# Patient Record
Sex: Female | Born: 1965
Health system: Southern US, Community
[De-identification: ages and names within clinical notes are randomized; demographics above are authoritative.]

## PROBLEM LIST (undated history)

## (undated) ENCOUNTER — Emergency Department (HOSPITAL_COMMUNITY): Payer: 59

## (undated) DIAGNOSIS — I10 Essential (primary) hypertension: Secondary | ICD-10-CM

## (undated) DIAGNOSIS — E739 Lactose intolerance, unspecified: Secondary | ICD-10-CM

## (undated) DIAGNOSIS — E119 Type 2 diabetes mellitus without complications: Secondary | ICD-10-CM

## (undated) DIAGNOSIS — M2550 Pain in unspecified joint: Secondary | ICD-10-CM

## (undated) DIAGNOSIS — K219 Gastro-esophageal reflux disease without esophagitis: Secondary | ICD-10-CM

## (undated) DIAGNOSIS — R51 Headache: Secondary | ICD-10-CM

## (undated) DIAGNOSIS — M797 Fibromyalgia: Secondary | ICD-10-CM

## (undated) DIAGNOSIS — T4145XA Adverse effect of unspecified anesthetic, initial encounter: Secondary | ICD-10-CM

## (undated) DIAGNOSIS — G9332 Myalgic encephalomyelitis/chronic fatigue syndrome: Secondary | ICD-10-CM

## (undated) DIAGNOSIS — J45909 Unspecified asthma, uncomplicated: Secondary | ICD-10-CM

## (undated) DIAGNOSIS — R519 Headache, unspecified: Secondary | ICD-10-CM

## (undated) DIAGNOSIS — T8859XA Other complications of anesthesia, initial encounter: Secondary | ICD-10-CM

## (undated) DIAGNOSIS — O24419 Gestational diabetes mellitus in pregnancy, unspecified control: Secondary | ICD-10-CM

## (undated) DIAGNOSIS — K760 Fatty (change of) liver, not elsewhere classified: Secondary | ICD-10-CM

## (undated) DIAGNOSIS — F32A Depression, unspecified: Secondary | ICD-10-CM

## (undated) DIAGNOSIS — R5382 Chronic fatigue, unspecified: Secondary | ICD-10-CM

## (undated) DIAGNOSIS — M8580 Other specified disorders of bone density and structure, unspecified site: Secondary | ICD-10-CM

## (undated) DIAGNOSIS — E7800 Pure hypercholesterolemia, unspecified: Secondary | ICD-10-CM

## (undated) DIAGNOSIS — M549 Dorsalgia, unspecified: Secondary | ICD-10-CM

## (undated) DIAGNOSIS — Z8489 Family history of other specified conditions: Secondary | ICD-10-CM

## (undated) DIAGNOSIS — K589 Irritable bowel syndrome without diarrhea: Secondary | ICD-10-CM

## (undated) DIAGNOSIS — D649 Anemia, unspecified: Secondary | ICD-10-CM

## (undated) DIAGNOSIS — F419 Anxiety disorder, unspecified: Secondary | ICD-10-CM

## (undated) DIAGNOSIS — R0602 Shortness of breath: Secondary | ICD-10-CM

## (undated) DIAGNOSIS — K5900 Constipation, unspecified: Secondary | ICD-10-CM

## (undated) DIAGNOSIS — F329 Major depressive disorder, single episode, unspecified: Secondary | ICD-10-CM

## (undated) HISTORY — DX: Dorsalgia, unspecified: M54.9

## (undated) HISTORY — DX: Other specified disorders of bone density and structure, unspecified site: M85.80

## (undated) HISTORY — DX: Pure hypercholesterolemia, unspecified: E78.00

## (undated) HISTORY — DX: Essential (primary) hypertension: I10

## (undated) HISTORY — DX: Type 2 diabetes mellitus without complications: E11.9

## (undated) HISTORY — DX: Fatty (change of) liver, not elsewhere classified: K76.0

## (undated) HISTORY — DX: Irritable bowel syndrome, unspecified: K58.9

## (undated) HISTORY — PX: BREAST BIOPSY: SHX20

## (undated) HISTORY — DX: Lactose intolerance, unspecified: E73.9

## (undated) HISTORY — PX: CHOLECYSTECTOMY: SHX55

## (undated) HISTORY — DX: Chronic fatigue, unspecified: R53.82

## (undated) HISTORY — PX: BREAST EXCISIONAL BIOPSY: SUR124

## (undated) HISTORY — DX: Shortness of breath: R06.02

## (undated) HISTORY — DX: Myalgic encephalomyelitis/chronic fatigue syndrome: G93.32

## (undated) HISTORY — DX: Constipation, unspecified: K59.00

## (undated) HISTORY — DX: Pain in unspecified joint: M25.50

---

## 2002-03-09 ENCOUNTER — Ambulatory Visit (HOSPITAL_COMMUNITY): Admission: RE | Admit: 2002-03-09 | Discharge: 2002-03-09 | Payer: Self-pay | Admitting: Family Medicine

## 2002-03-09 ENCOUNTER — Encounter: Payer: Self-pay | Admitting: Family Medicine

## 2006-06-28 ENCOUNTER — Encounter: Admission: RE | Admit: 2006-06-28 | Discharge: 2006-06-28 | Payer: Self-pay | Admitting: Obstetrics and Gynecology

## 2006-07-21 ENCOUNTER — Encounter: Admission: RE | Admit: 2006-07-21 | Discharge: 2006-07-21 | Payer: Self-pay | Admitting: Obstetrics and Gynecology

## 2007-01-06 ENCOUNTER — Encounter: Admission: RE | Admit: 2007-01-06 | Discharge: 2007-01-06 | Payer: Self-pay | Admitting: Obstetrics and Gynecology

## 2007-06-30 ENCOUNTER — Encounter: Admission: RE | Admit: 2007-06-30 | Discharge: 2007-06-30 | Payer: Self-pay | Admitting: Obstetrics and Gynecology

## 2008-08-14 ENCOUNTER — Ambulatory Visit (HOSPITAL_COMMUNITY): Admission: RE | Admit: 2008-08-14 | Discharge: 2008-08-14 | Payer: Self-pay | Admitting: Family Medicine

## 2008-08-21 ENCOUNTER — Encounter: Admission: RE | Admit: 2008-08-21 | Discharge: 2008-08-21 | Payer: Self-pay | Admitting: Rheumatology

## 2008-08-26 ENCOUNTER — Encounter: Admission: RE | Admit: 2008-08-26 | Discharge: 2008-08-26 | Payer: Self-pay | Admitting: Rheumatology

## 2009-05-27 ENCOUNTER — Other Ambulatory Visit: Admission: RE | Admit: 2009-05-27 | Discharge: 2009-05-27 | Payer: Self-pay | Admitting: Obstetrics and Gynecology

## 2009-11-04 ENCOUNTER — Encounter: Admission: RE | Admit: 2009-11-04 | Discharge: 2009-11-04 | Payer: Self-pay | Admitting: Obstetrics and Gynecology

## 2010-05-28 ENCOUNTER — Other Ambulatory Visit: Admission: RE | Admit: 2010-05-28 | Discharge: 2010-05-28 | Payer: Self-pay | Admitting: Obstetrics and Gynecology

## 2010-06-11 ENCOUNTER — Emergency Department (HOSPITAL_COMMUNITY): Admission: EM | Admit: 2010-06-11 | Discharge: 2010-06-11 | Payer: Self-pay | Admitting: Emergency Medicine

## 2010-07-02 ENCOUNTER — Emergency Department (HOSPITAL_COMMUNITY): Admission: EM | Admit: 2010-07-02 | Discharge: 2010-07-02 | Payer: Self-pay | Admitting: Family Medicine

## 2010-09-05 ENCOUNTER — Inpatient Hospital Stay (HOSPITAL_COMMUNITY)
Admission: EM | Admit: 2010-09-05 | Discharge: 2010-09-07 | Payer: Self-pay | Source: Home / Self Care | Admitting: Emergency Medicine

## 2010-09-06 ENCOUNTER — Encounter (INDEPENDENT_AMBULATORY_CARE_PROVIDER_SITE_OTHER): Payer: Self-pay

## 2010-11-30 ENCOUNTER — Emergency Department (HOSPITAL_COMMUNITY)
Admission: EM | Admit: 2010-11-30 | Discharge: 2010-11-30 | Payer: Self-pay | Source: Home / Self Care | Admitting: Emergency Medicine

## 2010-12-26 ENCOUNTER — Encounter: Payer: Self-pay | Admitting: Emergency Medicine

## 2011-02-18 LAB — URINALYSIS, DIPSTICK ONLY
Bilirubin Urine: NEGATIVE
Glucose, UA: NEGATIVE mg/dL
Ketones, ur: NEGATIVE mg/dL
Leukocytes, UA: NEGATIVE
Nitrite: NEGATIVE
Protein, ur: NEGATIVE mg/dL
Specific Gravity, Urine: 1.005 (ref 1.005–1.030)
Urobilinogen, UA: 1 mg/dL (ref 0.0–1.0)
pH: 7.5 (ref 5.0–8.0)

## 2011-02-18 LAB — DIFFERENTIAL
Basophils Absolute: 0 10*3/uL (ref 0.0–0.1)
Basophils Relative: 0 % (ref 0–1)
Eosinophils Absolute: 0 10*3/uL (ref 0.0–0.7)
Eosinophils Relative: 0 % (ref 0–5)
Lymphocytes Relative: 14 % (ref 12–46)
Lymphs Abs: 1.4 10*3/uL (ref 0.7–4.0)
Monocytes Absolute: 0.5 10*3/uL (ref 0.1–1.0)
Monocytes Relative: 5 % (ref 3–12)
Neutro Abs: 8.1 10*3/uL — ABNORMAL HIGH (ref 1.7–7.7)
Neutrophils Relative %: 81 % — ABNORMAL HIGH (ref 43–77)

## 2011-02-18 LAB — URINALYSIS, ROUTINE W REFLEX MICROSCOPIC
Glucose, UA: NEGATIVE mg/dL
Ketones, ur: >80 mg/dL — AB
Nitrite: NEGATIVE
Protein, ur: 100 mg/dL — AB
Specific Gravity, Urine: 1.028 (ref 1.005–1.030)
Urobilinogen, UA: 1 mg/dL (ref 0.0–1.0)
pH: 6.5 (ref 5.0–8.0)

## 2011-02-18 LAB — URINE MICROSCOPIC-ADD ON

## 2011-02-18 LAB — COMPREHENSIVE METABOLIC PANEL
ALT: 207 U/L — ABNORMAL HIGH (ref 0–35)
ALT: 347 U/L — ABNORMAL HIGH (ref 0–35)
AST: 202 U/L — ABNORMAL HIGH (ref 0–37)
AST: 80 U/L — ABNORMAL HIGH (ref 0–37)
Albumin: 3.2 g/dL — ABNORMAL LOW (ref 3.5–5.2)
Albumin: 3.9 g/dL (ref 3.5–5.2)
Alkaline Phosphatase: 119 U/L — ABNORMAL HIGH (ref 39–117)
Alkaline Phosphatase: 86 U/L (ref 39–117)
BUN: 4 mg/dL — ABNORMAL LOW (ref 6–23)
BUN: 9 mg/dL (ref 6–23)
CO2: 26 mEq/L (ref 19–32)
CO2: 27 mEq/L (ref 19–32)
Calcium: 8.5 mg/dL (ref 8.4–10.5)
Calcium: 9.3 mg/dL (ref 8.4–10.5)
Chloride: 100 mEq/L (ref 96–112)
Chloride: 103 mEq/L (ref 96–112)
Creatinine, Ser: 0.66 mg/dL (ref 0.4–1.2)
Creatinine, Ser: 0.71 mg/dL (ref 0.4–1.2)
GFR calc Af Amer: >60 mL/min (ref >60)
GFR calc Af Amer: >60 mL/min (ref >60)
GFR calc non Af Amer: >60 mL/min (ref >60)
GFR calc non Af Amer: >60 mL/min (ref >60)
Glucose, Bld: 124 mg/dL — ABNORMAL HIGH (ref 70–99)
Glucose, Bld: 130 mg/dL — ABNORMAL HIGH (ref 70–99)
Potassium: 3.4 mEq/L — ABNORMAL LOW (ref 3.5–5.1)
Potassium: 3.8 mEq/L (ref 3.5–5.1)
Sodium: 136 mEq/L (ref 135–145)
Sodium: 136 mEq/L (ref 135–145)
Total Bilirubin: 1 mg/dL (ref 0.3–1.2)
Total Bilirubin: 1.7 mg/dL — ABNORMAL HIGH (ref 0.3–1.2)
Total Protein: 6.1 g/dL (ref 6.0–8.3)
Total Protein: 7.2 g/dL (ref 6.0–8.3)

## 2011-02-18 LAB — CBC
HCT: 37.5 % (ref 36.0–46.0)
HCT: 41.9 % (ref 36.0–46.0)
Hemoglobin: 12.1 g/dL (ref 12.0–15.0)
Hemoglobin: 13.9 g/dL (ref 12.0–15.0)
MCH: 28.3 pg (ref 26.0–34.0)
MCH: 28.4 pg (ref 26.0–34.0)
MCHC: 32.3 g/dL (ref 30.0–36.0)
MCHC: 33.2 g/dL (ref 30.0–36.0)
MCV: 85.7 fL (ref 78.0–100.0)
MCV: 87.6 fL (ref 78.0–100.0)
Platelets: 293 10*3/uL (ref 150–400)
Platelets: 309 10*3/uL (ref 150–400)
RBC: 4.28 MIL/uL (ref 3.87–5.11)
RBC: 4.89 MIL/uL (ref 3.87–5.11)
RDW: 14 % (ref 11.5–15.5)
RDW: 14.4 % (ref 11.5–15.5)
WBC: 10 10*3/uL (ref 4.0–10.5)
WBC: 10.4 10*3/uL (ref 4.0–10.5)

## 2011-02-18 LAB — LIPASE, BLOOD: Lipase: 27 U/L (ref 11–59)

## 2011-02-18 LAB — POCT PREGNANCY, URINE: Preg Test, Ur: NEGATIVE

## 2011-02-20 LAB — GLUCOSE, CAPILLARY: Glucose-Capillary: 168 mg/dL — ABNORMAL HIGH (ref 70–99)

## 2011-02-21 LAB — CBC
HCT: 45.6 % (ref 36.0–46.0)
Hemoglobin: 15.4 g/dL — ABNORMAL HIGH (ref 12.0–15.0)
MCH: 29 pg (ref 26.0–34.0)
MCHC: 33.8 g/dL (ref 30.0–36.0)
MCV: 85.7 fL (ref 78.0–100.0)
Platelets: 355 10*3/uL (ref 150–400)
RBC: 5.32 MIL/uL — ABNORMAL HIGH (ref 3.87–5.11)
RDW: 16.9 % — ABNORMAL HIGH (ref 11.5–15.5)
WBC: 11.4 10*3/uL — ABNORMAL HIGH (ref 4.0–10.5)

## 2011-02-21 LAB — URINE MICROSCOPIC-ADD ON

## 2011-02-21 LAB — URINALYSIS, ROUTINE W REFLEX MICROSCOPIC
Glucose, UA: 250 mg/dL — AB
Hgb urine dipstick: NEGATIVE
Nitrite: NEGATIVE
Protein, ur: 30 mg/dL — AB
Specific Gravity, Urine: 1.018 (ref 1.005–1.030)
Urobilinogen, UA: 0.2 mg/dL (ref 0.0–1.0)
pH: 6 (ref 5.0–8.0)

## 2011-02-21 LAB — POCT I-STAT, CHEM 8
BUN: 16 mg/dL (ref 6–23)
Calcium, Ion: 0.97 mmol/L — ABNORMAL LOW (ref 1.12–1.32)
Chloride: 103 meq/L (ref 96–112)
Creatinine, Ser: 0.8 mg/dL (ref 0.4–1.2)
Glucose, Bld: 247 mg/dL — ABNORMAL HIGH (ref 70–99)
HCT: 51 % — ABNORMAL HIGH (ref 36.0–46.0)
Hemoglobin: 17.3 g/dL — ABNORMAL HIGH (ref 12.0–15.0)
Potassium: 4 meq/L (ref 3.5–5.1)
Sodium: 131 meq/L — ABNORMAL LOW (ref 135–145)
TCO2: 22 mmol/L (ref 0–100)

## 2011-02-21 LAB — URINE CULTURE: Colony Count: >=100000

## 2012-08-18 ENCOUNTER — Other Ambulatory Visit: Payer: Self-pay | Admitting: Geriatric Medicine

## 2012-08-18 DIAGNOSIS — Z1231 Encounter for screening mammogram for malignant neoplasm of breast: Secondary | ICD-10-CM

## 2012-09-20 ENCOUNTER — Ambulatory Visit
Admission: RE | Admit: 2012-09-20 | Discharge: 2012-09-20 | Disposition: A | Discharge status: Home / Self Care | Payer: BC Managed Care – PPO | Source: Ambulatory Visit | Attending: Geriatric Medicine | Admitting: Geriatric Medicine

## 2012-09-20 DIAGNOSIS — Z1231 Encounter for screening mammogram for malignant neoplasm of breast: Secondary | ICD-10-CM

## 2012-12-25 ENCOUNTER — Other Ambulatory Visit: Payer: Self-pay | Admitting: Geriatric Medicine

## 2012-12-25 ENCOUNTER — Other Ambulatory Visit (HOSPITAL_COMMUNITY)
Admission: RE | Admit: 2012-12-25 | Discharge: 2012-12-25 | Disposition: A | Discharge status: Home / Self Care | Payer: BC Managed Care – PPO | Source: Ambulatory Visit | Attending: Geriatric Medicine | Admitting: Geriatric Medicine

## 2012-12-25 DIAGNOSIS — Z1151 Encounter for screening for human papillomavirus (HPV): Secondary | ICD-10-CM | POA: Insufficient documentation

## 2012-12-25 DIAGNOSIS — Z01419 Encounter for gynecological examination (general) (routine) without abnormal findings: Secondary | ICD-10-CM | POA: Insufficient documentation

## 2013-08-24 ENCOUNTER — Ambulatory Visit
Admission: RE | Admit: 2013-08-24 | Discharge: 2013-08-24 | Disposition: A | Discharge status: Home / Self Care | Payer: 59 | Source: Ambulatory Visit | Attending: Geriatric Medicine | Admitting: Geriatric Medicine

## 2013-08-24 ENCOUNTER — Other Ambulatory Visit: Payer: Self-pay | Admitting: Geriatric Medicine

## 2013-08-24 DIAGNOSIS — R109 Unspecified abdominal pain: Secondary | ICD-10-CM

## 2013-08-24 MED ORDER — IOHEXOL 300 MG/ML  SOLN
125.0000 mL | Freq: Once | INTRAMUSCULAR | Status: AC | PRN
  Administered 2013-08-24: 125 mL via INTRAVENOUS

## 2013-12-26 ENCOUNTER — Ambulatory Visit
Admission: RE | Admit: 2013-12-26 | Discharge: 2013-12-26 | Disposition: A | Discharge status: Home / Self Care | Payer: 59 | Source: Ambulatory Visit | Attending: Geriatric Medicine | Admitting: Geriatric Medicine

## 2013-12-26 ENCOUNTER — Other Ambulatory Visit: Payer: Self-pay | Admitting: Geriatric Medicine

## 2013-12-26 DIAGNOSIS — E049 Nontoxic goiter, unspecified: Secondary | ICD-10-CM

## 2013-12-27 ENCOUNTER — Other Ambulatory Visit: Payer: Self-pay | Admitting: Geriatric Medicine

## 2013-12-27 DIAGNOSIS — E041 Nontoxic single thyroid nodule: Secondary | ICD-10-CM

## 2014-01-03 ENCOUNTER — Other Ambulatory Visit (HOSPITAL_COMMUNITY)
Admission: RE | Admit: 2014-01-03 | Discharge: 2014-01-03 | Disposition: A | Discharge status: Home / Self Care | Payer: 59 | Source: Ambulatory Visit | Attending: Interventional Radiology | Admitting: Interventional Radiology

## 2014-01-03 ENCOUNTER — Ambulatory Visit
Admission: RE | Admit: 2014-01-03 | Discharge: 2014-01-03 | Disposition: A | Discharge status: Home / Self Care | Payer: 59 | Source: Ambulatory Visit | Attending: Geriatric Medicine | Admitting: Geriatric Medicine

## 2014-01-03 DIAGNOSIS — E041 Nontoxic single thyroid nodule: Secondary | ICD-10-CM

## 2014-01-03 DIAGNOSIS — E049 Nontoxic goiter, unspecified: Secondary | ICD-10-CM | POA: Insufficient documentation

## 2014-01-11 ENCOUNTER — Other Ambulatory Visit (HOSPITAL_COMMUNITY): Payer: Self-pay | Admitting: Geriatric Medicine

## 2014-01-11 ENCOUNTER — Ambulatory Visit (HOSPITAL_COMMUNITY): Payer: 59 | Attending: Cardiovascular Disease | Admitting: Radiology

## 2014-01-11 DIAGNOSIS — I77810 Thoracic aortic ectasia: Secondary | ICD-10-CM

## 2014-01-11 DIAGNOSIS — I10 Essential (primary) hypertension: Secondary | ICD-10-CM | POA: Insufficient documentation

## 2014-01-11 NOTE — Progress Notes (Signed)
Echocardiogram performed.  

## 2014-08-14 ENCOUNTER — Ambulatory Visit (INDEPENDENT_AMBULATORY_CARE_PROVIDER_SITE_OTHER): Payer: 59 | Admitting: Surgery

## 2014-08-14 ENCOUNTER — Other Ambulatory Visit (INDEPENDENT_AMBULATORY_CARE_PROVIDER_SITE_OTHER): Payer: Self-pay | Admitting: Surgery

## 2014-09-09 ENCOUNTER — Encounter (HOSPITAL_COMMUNITY): Payer: Self-pay | Admitting: Pharmacy Technician

## 2014-09-11 NOTE — Patient Instructions (Addendum)
CHANDI NICKLIN  09/11/2014   Your procedure is scheduled on:  09/19/2014    Come to [facility] on [address].  Please note the BIG RED SIGN close to the front entrance.  Come down the drive and you will see the [place] Entrance.  and come thru the Short Stay Entrance.    Call this number if you have problems the morning of surgery: [phone]   Remember:   Do not eat food or drink liquids after midnight.   Take these medicines the morning of surgery with A SIP OF WATER: Albuterol Inhaler if needed, Qvar inhaler , restasis eye drops, Nexium, Flonase Nasal Spray, Claritin Bring Inhalers and eye drops with you .     Do not wear jewelry, make-up or nail polish.  Do not wear lotions, powders, or perfumes. You may wear deodorant.  Do not shave 48 hours prior to surgery. Men may shave face and neck.  Do not bring valuables to the hospital.  Contacts, dentures or bridgework may not be worn into surgery.  Leave suitcase in the car. After surgery it may be brought to your room.  For patients admitted to the hospital, checkout time is 11:00 AM the day of  discharge.   Patients discharged the day of surgery will not be allowed to drive  home.  Name and phone number of your driver:   SEE CHG INSTRUCTION SHEET    Please read over the following fact sheets that you were given: MRSA Information, coughing and deep breathing exercises, leg exercises            [place] - Preparing for Surgery Before surgery, you can play an important role.  Because skin is not sterile, your skin needs to be as free of germs as possible.  You can reduce the number of germs on your skin by washing with CHG (chlorahexidine gluconate) soap before surgery.  CHG is an antiseptic cleaner which kills germs and bonds with the skin to continue killing germs even after washing. Please DO NOT use if you have an allergy to CHG or antibacterial soaps.  If your skin becomes reddened/irritated stop using the  CHG and inform your nurse when you arrive at Short Stay. Do not shave (including legs and underarms) for at least 48 hours prior to the first CHG shower.  You may shave your face/neck. Please follow these instructions carefully:  1.  Shower with CHG Soap the night before surgery and the  morning of Surgery.  2.  If you choose to wash your hair, wash your hair first as usual with your  normal  shampoo.  3.  After you shampoo, rinse your hair and body thoroughly to remove the  shampoo.                           4.  Use CHG as you would any other liquid soap.  You can apply chg directly  to the skin and wash                       Gently with a scrungie or clean washcloth.  5.  Apply the CHG Soap to your body ONLY FROM THE NECK DOWN.   Do not use on face/ open                           Wound or  open sores. Avoid contact with eyes, ears mouth and genitals (private parts).                       Wash face,  Genitals (private parts) with your normal soap.             6.  Wash thoroughly, paying special attention to the area where your surgery  will be performed.  7.  Thoroughly rinse your body with warm water from the neck down.  8.  DO NOT shower/wash with your normal soap after using and rinsing off  the CHG Soap.                9.  Pat yourself dry with a clean towel.            10.  Wear clean pajamas.            11.  Place clean sheets on your bed the night of your first shower and do not  sleep with pets. Day of Surgery : Do not apply any lotions/deodorants the morning of surgery.  Please wear clean clothes to the hospital/surgery center.  FAILURE TO FOLLOW THESE INSTRUCTIONS MAY RESULT IN THE CANCELLATION OF YOUR SURGERY PATIENT SIGNATURE_________________________________  NURSE SIGNATURE__________________________________  ________________________________________________________________________

## 2014-09-12 ENCOUNTER — Encounter (HOSPITAL_COMMUNITY)
Admission: RE | Admit: 2014-09-12 | Discharge: 2014-09-12 | Disposition: A | Discharge status: Home / Self Care | Payer: 59 | Source: Ambulatory Visit | Attending: Surgery | Admitting: Surgery

## 2014-09-12 ENCOUNTER — Encounter (INDEPENDENT_AMBULATORY_CARE_PROVIDER_SITE_OTHER): Payer: Self-pay

## 2014-09-12 ENCOUNTER — Encounter (HOSPITAL_COMMUNITY): Payer: Self-pay

## 2014-09-12 ENCOUNTER — Other Ambulatory Visit (INDEPENDENT_AMBULATORY_CARE_PROVIDER_SITE_OTHER): Payer: Self-pay | Admitting: Surgery

## 2014-09-12 ENCOUNTER — Ambulatory Visit (HOSPITAL_COMMUNITY)
Admission: RE | Admit: 2014-09-12 | Discharge: 2014-09-12 | Disposition: A | Discharge status: Home / Self Care | Payer: 59 | Source: Ambulatory Visit | Attending: Surgery | Admitting: Surgery

## 2014-09-12 DIAGNOSIS — R05 Cough: Secondary | ICD-10-CM | POA: Insufficient documentation

## 2014-09-12 DIAGNOSIS — R0989 Other specified symptoms and signs involving the circulatory and respiratory systems: Secondary | ICD-10-CM | POA: Diagnosis not present

## 2014-09-12 DIAGNOSIS — Z01818 Encounter for other preprocedural examination: Secondary | ICD-10-CM | POA: Diagnosis present

## 2014-09-12 DIAGNOSIS — J45909 Unspecified asthma, uncomplicated: Secondary | ICD-10-CM | POA: Diagnosis not present

## 2014-09-12 DIAGNOSIS — I10 Essential (primary) hypertension: Secondary | ICD-10-CM | POA: Insufficient documentation

## 2014-09-12 HISTORY — DX: Gestational diabetes mellitus in pregnancy, unspecified control: O24.419

## 2014-09-12 HISTORY — DX: Essential (primary) hypertension: I10

## 2014-09-12 HISTORY — DX: Family history of other specified conditions: Z84.89

## 2014-09-12 HISTORY — DX: Major depressive disorder, single episode, unspecified: F32.9

## 2014-09-12 HISTORY — DX: Headache: R51

## 2014-09-12 HISTORY — DX: Other complications of anesthesia, initial encounter: T88.59XA

## 2014-09-12 HISTORY — DX: Adverse effect of unspecified anesthetic, initial encounter: T41.45XA

## 2014-09-12 HISTORY — DX: Depression, unspecified: F32.A

## 2014-09-12 HISTORY — DX: Fibromyalgia: M79.7

## 2014-09-12 HISTORY — DX: Gastro-esophageal reflux disease without esophagitis: K21.9

## 2014-09-12 HISTORY — DX: Headache, unspecified: R51.9

## 2014-09-12 HISTORY — DX: Anxiety disorder, unspecified: F41.9

## 2014-09-12 HISTORY — DX: Anemia, unspecified: D64.9

## 2014-09-12 HISTORY — DX: Unspecified asthma, uncomplicated: J45.909

## 2014-09-12 LAB — BASIC METABOLIC PANEL
Anion gap: 13 (ref 5–15)
BUN: 11 mg/dL (ref 6–23)
CO2: 27 mEq/L (ref 19–32)
Calcium: 9.7 mg/dL (ref 8.4–10.5)
Chloride: 100 mEq/L (ref 96–112)
Creatinine, Ser: 0.73 mg/dL (ref 0.50–1.10)
GFR calc Af Amer: >90 mL/min (ref >90)
GFR calc non Af Amer: >90 mL/min (ref >90)
Glucose, Bld: 178 mg/dL — ABNORMAL HIGH (ref 70–99)
Potassium: 4 mEq/L (ref 3.7–5.3)
Sodium: 140 mEq/L (ref 137–147)

## 2014-09-12 LAB — CBC
HCT: 43.3 % (ref 36.0–46.0)
Hemoglobin: 14.6 g/dL (ref 12.0–15.0)
MCH: 29.9 pg (ref 26.0–34.0)
MCHC: 33.7 g/dL (ref 30.0–36.0)
MCV: 88.7 fL (ref 78.0–100.0)
Platelets: 276 10*3/uL (ref 150–400)
RBC: 4.88 MIL/uL (ref 3.87–5.11)
RDW: 13 % (ref 11.5–15.5)
WBC: 9.2 10*3/uL (ref 4.0–10.5)

## 2014-09-12 MED ORDER — CIPROFLOXACIN IN D5W 400 MG/200ML IV SOLN
400.0000 mg | Freq: Once | INTRAVENOUS | Status: AC
  Administered 2014-09-19: 400 mg via INTRAVENOUS

## 2014-09-12 NOTE — Progress Notes (Signed)
Quick Note:  These results are acceptable for scheduled surgery.   M. , MD, FACS [facility] Office: [phone]   ______ 

## 2014-09-12 NOTE — Progress Notes (Signed)
Quick Note:  These results are acceptable for scheduled surgery.   M. , MD, FACS [facility] Office: [phone]   ______ 

## 2014-09-12 NOTE — Progress Notes (Signed)
Patient has a noted cough at preop appointment.  CXR completed on 09/12/2014.  I instructed patient to let PCP be aware and to let you also be aware.  FYI.

## 2014-09-12 NOTE — Progress Notes (Signed)
ECHO- 01/21/14- EPIC  EKG- 12/26/13 chart  LOV- 08/05/14- DR Buddy Duty on chart  LOV 04/18/14 DR Stoneking on chart

## 2014-09-12 NOTE — Progress Notes (Signed)
Quick Note:  Pre-operative chest x-ray is acceptable for scheduled surgery.   M. , MD, FACS [facility] Office: [phone]   ______ 

## 2014-09-12 NOTE — Progress Notes (Signed)
Patient has allergy to Penicillins- causes rash.  Ancef ordered preop for surgery on 09/19/14.

## 2014-09-18 ENCOUNTER — Encounter (HOSPITAL_COMMUNITY): Payer: Self-pay | Admitting: Surgery

## 2014-09-18 DIAGNOSIS — E042 Nontoxic multinodular goiter: Secondary | ICD-10-CM

## 2014-09-18 NOTE — H&P (Signed)
General Surgery [facility]  Alcus Dad Location: [facility] Patient #: [id number] DOB: 1966-11-24 Married / Language: English / Race: Refused to Report/Unreported Female  History of Present Illness  Evaluate multinodular goiter - patient referred by Dr. Dagmar Hait. Primary physician is Dr. Lajean Manes.  The patient is a 48 year old female who presents with a thyroid nodule. The patient presents with a multinodular thyroid goiter. This has likely been present since childhood. It was appreciated on physical examination in January 2015 by her primary care physician. Ultrasound was obtained and demonstrated bilateral thyroid nodules in an enlarged thyroid gland. Fine-needle aspiration of the dominant left thyroid nodule was performed. This had benign cytopathology. Patient has mild to moderate compressive symptoms including pressure sensation and globus sensation. She denies dysphagia. She was referred to endocrinology. She has never been on thyroid medication. She has had no prior head or neck surgery. She presents today to discuss total thyroidectomy. She is accompanied by her husband.  Patient has no history of prior head or neck surgery.There is no family history of thyroid disease. There is no family history of thyroid cancer or other endocrine neoplasm.   Other Problems  Anxiety Disorder Asthma Back Pain Cholelithiasis Depression Gastroesophageal Reflux Disease High blood pressure Hypercholesterolemia Migraine Headache Other disease, cancer, significant illness Thyroid Disease  Past Surgical History  Breast Biopsy Right. Breast Mass; Local Excision Right. Cesarean Section - Multiple Gallbladder Surgery - Laparoscopic Oral Surgery  Diagnostic Studies History Mammogram 1-3 years ago Pap Smear 1-5 years ago  Allergies  Penicillin G Sodium *PENICILLINS* Bactrim *ANTI-INFECTIVE AGENTS - MISC.*  Social  History Alcohol use Occasional alcohol use. Caffeine use Carbonated beverages, Coffee. No drug use Tobacco use Never smoker.  Family History Alcohol Abuse Family Members In General, Father, Mother, Son. Anesthetic complications Father. Arthritis Family Members In General, Mother, Sister. Breast Cancer Family Members In General. Depression Family Members In General, Father, Mother, Sister. Diabetes Mellitus Family Members In General. Heart Disease Brother, Father. Heart disease in female family member before age 91 Hypertension Brother, Father, Mother, Sister. Migraine Headache Mother, Sister. Ovarian Cancer Family Members In General.  Pregnancy / Birth History  Age at menarche 45 years. Age of menopause <44 Contraceptive History Oral contraceptives. Gravida 4 Maternal age 68-25 Para 3 Regular periods  Review of Systems General Present- Fatigue. Not Present- Appetite Loss, Chills, Fever, Night Sweats, Weight Gain and Weight Loss. Skin Not Present- Change in Wart/Mole, Dryness, Hives, Jaundice, New Lesions, Non-Healing Wounds, Rash and Ulcer. HEENT Present- Hoarseness, Seasonal Allergies, Sore Throat and Wears glasses/contact lenses. Not Present- Earache, Hearing Loss, Nose Bleed, Oral Ulcers, Ringing in the Ears, Sinus Pain, Visual Disturbances and Yellow Eyes. Respiratory Present- Chronic Cough and Wheezing. Not Present- Bloody sputum, Difficulty Breathing and Snoring. Breast Not Present- Breast Mass, Breast Pain, Nipple Discharge and Skin Changes. Cardiovascular Present- Difficulty Breathing Lying Down, Leg Cramps, Palpitations and Swelling of Extremities. Not Present- Chest Pain, Rapid Heart Rate and Shortness of Breath. Gastrointestinal Present- Bloating, Constipation, Indigestion and Nausea. Not Present- Abdominal Pain, Bloody Stool, Change in Bowel Habits, Chronic diarrhea, Difficulty Swallowing, Excessive gas, Gets full quickly at meals, Hemorrhoids,  Rectal Pain and Vomiting. Female Genitourinary Not Present- Frequency, Nocturia, Painful Urination, Pelvic Pain and Urgency. Neurological Present- Headaches and Tremor. Not Present- Decreased Memory, Fainting, Numbness, Seizures, Tingling, Trouble walking and Weakness. Psychiatric Present- Anxiety and Depression. Not Present- Bipolar, Change in Sleep Pattern, Fearful and Frequent crying. Endocrine Present- Heat Intolerance, Hot flashes  and Thyroid Problems (complains of fatigue. Complains of mild compressive symptoms with pressure and globus sensation.Denies dysphagia.). Not Present- Cold Intolerance, Excessive Hunger, Hair Changes and New Diabetes.   Vitals  08/14/2014 9:09 AM Weight: 220 lb Height: 66in Body Surface Area: 2.16 m Body Mass Index: 35.51 kg/m Temp.: 98.6F(Oral)  Pulse: 78 (Regular)  Resp.: 18 (Unlabored)  BP: 118/78 (Sitting, Left Arm, Standard)    Physical Exam General Mental Status-Alert. General Appearance-Consistent with stated age. Hydration-Well hydrated. Voice-Normal.  Head and Neck Head-normocephalic, atraumatic with no lesions or palpable masses. Trachea-midline. Thyroid Gland Characteristics - normal size and consistency. Note: Thyroid gland is multinodular with dominant nodules palpable bilaterally. The largest nodules on the left measuring approximately 2.5 cm. It is smooth and mobile with swallowing.There are multiple palpable nodules in the right lobe with the largest measuring approximately 2 cm. There is mild tenderness. There is no palpable lymphadenopathy.  Eye Eyeball - Bilateral-Extraocular movements intact. Sclera/Conjunctiva - Bilateral-No scleral icterus.  Chest and Lung Exam Chest and lung exam reveals -quiet, even and easy respiratory effort with no use of accessory muscles, normal resonance, no flatness or dullness, non-tender and normal tactile fremitus and on auscultation, normal breath sounds, no  adventitious sounds and normal vocal resonance. Inspection Chest Wall - Normal. Back - normal.  Cardiovascular Cardiovascular examination reveals -on palpation PMI is normal in location and amplitude, no palpable S3 or S4. Normal cardiac borders., normal heart sounds, regular rate and rhythm with no murmurs, carotid auscultation reveals no bruits and normal pedal pulses bilaterally.  Peripheral Vascular Upper Extremity Inspection - Bilateral - Normal - No Clubbing, No Cyanosis, No Edema, Pulses Intact. Palpation - Pulses bilaterally normal. Lower Extremity Palpation - Pulses bilaterally normal.  Neurologic Neurologic evaluation reveals -alert and oriented x 3 with no impairment of recent or remote memory. Mental Status-Normal.  Musculoskeletal Normal Exam - Left-Upper Extremity Strength Normal and Lower Extremity Strength Normal. Normal Exam - Right-Upper Extremity Strength Normal, Lower Extremity Weakness.    Assessment & Plan  GOITER, NONTOXIC, MULTINODULAR (Principal Diagnosis) (241.1  E04.2) Impression: The patient has a multinodular thyroid goiter with mild compressive symptoms. Cytopathology from her fine-needle aspiration biopsy is benign. We discussed options for management. Patient wishes to proceed with total thyroidectomy. We discussed risk and benefits of the procedure including the potential for recurrent laryngeal nerve injury and injury to parathyroid glands. We discussed the hospital stay to be anticipated. We discussed her recovery and return to work. We discussed the need for lifelong thyroid hormone replacement. She understands and wishes to proceed.We will make arrangements for surgery at a time convenient for the patient in the near future.  Earnstine Regal, MD, [facility] Office: [phone]

## 2014-09-18 NOTE — Anesthesia Preprocedure Evaluation (Addendum)
Anesthesia Evaluation  Patient identified by MRN, date of birth, ID band Patient awake    Reviewed: Allergy & Precautions, H&P , NPO status , Patient's Chart, lab work & pertinent test results  History of Anesthesia Complications (+) PONV, Family history of anesthesia reaction and history of anesthetic complications (reports father was "slow to emerge" after anesthesia)  Airway Mallampati: II TM Distance: >3 FB Neck ROM: Full    Dental no notable dental hx. (+) Teeth Intact, Dental Advisory Given   Pulmonary asthma ,  breath sounds clear to auscultation  Pulmonary exam normal       Cardiovascular Exercise Tolerance: Good hypertension, Pt. on medications Rhythm:Regular Rate:Normal     Neuro/Psych  Headaches, PSYCHIATRIC DISORDERS Anxiety Depression    GI/Hepatic Neg liver ROS, GERD-  Medicated and Controlled,  Endo/Other  diabetes ("pre" diabetes, currently on diet controlled, no medications, no insulin)  Renal/GU negative Renal ROS  negative genitourinary   Musculoskeletal  (+) Fibromyalgia -  Abdominal   Peds negative pediatric ROS (+)  Hematology negative hematology ROS (+)   Anesthesia Other Findings   Reproductive/Obstetrics negative OB ROS                          Anesthesia Physical Anesthesia Plan  ASA: II  Anesthesia Plan: General   Post-op Pain Management:    Induction: Intravenous  Airway Management Planned: Oral ETT  Additional Equipment:   Intra-op Plan:   Post-operative Plan: Extubation in OR  Informed Consent: I have reviewed the patients History and Physical, chart, labs and discussed the procedure including the risks, benefits and alternatives for the proposed anesthesia with the patient or authorized representative who has indicated his/her understanding and acceptance.   Dental advisory given  Plan Discussed with: CRNA  Anesthesia Plan Comments:          Anesthesia Quick Evaluation

## 2014-09-19 ENCOUNTER — Encounter (HOSPITAL_COMMUNITY)
Admission: RE | Disposition: A | Discharge status: Home / Self Care | Payer: Self-pay | Source: Ambulatory Visit | Attending: Surgery

## 2014-09-19 ENCOUNTER — Observation Stay (HOSPITAL_COMMUNITY)
Admission: RE | Admit: 2014-09-19 | Discharge: 2014-09-20 | Disposition: A | Discharge status: Home / Self Care | Payer: 59 | Source: Ambulatory Visit | Attending: Surgery | Admitting: Surgery

## 2014-09-19 ENCOUNTER — Ambulatory Visit (HOSPITAL_COMMUNITY): Payer: 59 | Admitting: Anesthesiology

## 2014-09-19 ENCOUNTER — Encounter (HOSPITAL_COMMUNITY): Payer: Self-pay | Admitting: *Deleted

## 2014-09-19 ENCOUNTER — Encounter (HOSPITAL_COMMUNITY): Payer: 59 | Admitting: Anesthesiology

## 2014-09-19 DIAGNOSIS — Z880 Allergy status to penicillin: Secondary | ICD-10-CM | POA: Insufficient documentation

## 2014-09-19 DIAGNOSIS — F418 Other specified anxiety disorders: Secondary | ICD-10-CM | POA: Insufficient documentation

## 2014-09-19 DIAGNOSIS — F329 Major depressive disorder, single episode, unspecified: Secondary | ICD-10-CM | POA: Diagnosis not present

## 2014-09-19 DIAGNOSIS — Z882 Allergy status to sulfonamides status: Secondary | ICD-10-CM | POA: Diagnosis not present

## 2014-09-19 DIAGNOSIS — M797 Fibromyalgia: Secondary | ICD-10-CM | POA: Diagnosis not present

## 2014-09-19 DIAGNOSIS — J45909 Unspecified asthma, uncomplicated: Secondary | ICD-10-CM | POA: Insufficient documentation

## 2014-09-19 DIAGNOSIS — E780 Pure hypercholesterolemia: Secondary | ICD-10-CM | POA: Insufficient documentation

## 2014-09-19 DIAGNOSIS — E042 Nontoxic multinodular goiter: Principal | ICD-10-CM

## 2014-09-19 DIAGNOSIS — K219 Gastro-esophageal reflux disease without esophagitis: Secondary | ICD-10-CM | POA: Insufficient documentation

## 2014-09-19 HISTORY — PX: THYROIDECTOMY: SHX17

## 2014-09-19 SURGERY — THYROIDECTOMY
Anesthesia: General | Site: Neck

## 2014-09-19 MED ORDER — LACTATED RINGERS IV SOLN
INTRAVENOUS | Status: DC | PRN
  Administered 2014-09-19 (×2): via INTRAVENOUS

## 2014-09-19 MED ORDER — PROPOFOL 10 MG/ML IV BOLUS
INTRAVENOUS | Status: DC | PRN
  Administered 2014-09-19: 150 mg via INTRAVENOUS

## 2014-09-19 MED ORDER — KCL IN DEXTROSE-NACL 30-5-0.45 MEQ/L-%-% IV SOLN
INTRAVENOUS | Status: DC
  Administered 2014-09-19 – 2014-09-20 (×2): via INTRAVENOUS
  Filled 2014-09-19 (×3): qty 1000

## 2014-09-19 MED ORDER — SUCCINYLCHOLINE CHLORIDE 20 MG/ML IJ SOLN
INTRAMUSCULAR | Status: DC | PRN
  Administered 2014-09-19: 100 mg via INTRAVENOUS

## 2014-09-19 MED ORDER — ONDANSETRON HCL 4 MG/2ML IJ SOLN
INTRAMUSCULAR | Status: AC
  Filled 2014-09-19: qty 2

## 2014-09-19 MED ORDER — FENTANYL CITRATE 0.05 MG/ML IJ SOLN
INTRAMUSCULAR | Status: DC | PRN
  Administered 2014-09-19: 50 ug via INTRAVENOUS
  Administered 2014-09-19 (×3): 100 ug via INTRAVENOUS

## 2014-09-19 MED ORDER — 0.9 % SODIUM CHLORIDE (POUR BTL) OPTIME
TOPICAL | Status: DC | PRN
  Administered 2014-09-19: 1000 mL

## 2014-09-19 MED ORDER — HYDROCODONE-ACETAMINOPHEN 5-325 MG PO TABS
1.0000 | ORAL_TABLET | ORAL | Status: DC | PRN
  Filled 2014-09-19: qty 2

## 2014-09-19 MED ORDER — FENTANYL CITRATE 0.05 MG/ML IJ SOLN
INTRAMUSCULAR | Status: AC
  Filled 2014-09-19: qty 2

## 2014-09-19 MED ORDER — FENTANYL CITRATE 0.05 MG/ML IJ SOLN
INTRAMUSCULAR | Status: AC
  Filled 2014-09-19: qty 5

## 2014-09-19 MED ORDER — CEFAZOLIN SODIUM-DEXTROSE 2-3 GM-% IV SOLR
INTRAVENOUS | Status: AC
  Filled 2014-09-19: qty 50

## 2014-09-19 MED ORDER — ALBUTEROL SULFATE HFA 108 (90 BASE) MCG/ACT IN AERS
INHALATION_SPRAY | RESPIRATORY_TRACT | Status: DC | PRN
  Administered 2014-09-19: 3 via RESPIRATORY_TRACT

## 2014-09-19 MED ORDER — BECLOMETHASONE DIPROPIONATE 80 MCG/ACT IN AERS
1.0000 | INHALATION_SPRAY | Freq: Two times a day (BID) | RESPIRATORY_TRACT | Status: DC | PRN
  Filled 2014-09-19: qty 8.7

## 2014-09-19 MED ORDER — NEOSTIGMINE METHYLSULFATE 10 MG/10ML IV SOLN
INTRAVENOUS | Status: DC | PRN
  Administered 2014-09-19: 5 mg via INTRAVENOUS

## 2014-09-19 MED ORDER — CIPROFLOXACIN IN D5W 400 MG/200ML IV SOLN
INTRAVENOUS | Status: AC
  Filled 2014-09-19: qty 200

## 2014-09-19 MED ORDER — METOCLOPRAMIDE HCL 5 MG/ML IJ SOLN
INTRAMUSCULAR | Status: DC | PRN
  Administered 2014-09-19: 10 mg via INTRAVENOUS

## 2014-09-19 MED ORDER — MIDAZOLAM HCL 5 MG/5ML IJ SOLN
INTRAMUSCULAR | Status: DC | PRN
  Administered 2014-09-19: 2 mg via INTRAVENOUS

## 2014-09-19 MED ORDER — NEOSTIGMINE METHYLSULFATE 10 MG/10ML IV SOLN
INTRAVENOUS | Status: AC
  Filled 2014-09-19: qty 1

## 2014-09-19 MED ORDER — PROMETHAZINE HCL 25 MG/ML IJ SOLN: 6.2500 mg | INTRAMUSCULAR | Status: DC | PRN

## 2014-09-19 MED ORDER — LACTATED RINGERS IV SOLN: INTRAVENOUS | Status: DC

## 2014-09-19 MED ORDER — FENTANYL CITRATE 0.05 MG/ML IJ SOLN
25.0000 ug | INTRAMUSCULAR | Status: DC | PRN
  Administered 2014-09-19 (×2): 50 ug via INTRAVENOUS

## 2014-09-19 MED ORDER — DEXAMETHASONE SODIUM PHOSPHATE 10 MG/ML IJ SOLN
INTRAMUSCULAR | Status: DC | PRN
  Administered 2014-09-19: 10 mg via INTRAVENOUS

## 2014-09-19 MED ORDER — FLUTICASONE PROPIONATE HFA 44 MCG/ACT IN AERO: 1.0000 | INHALATION_SPRAY | Freq: Two times a day (BID) | RESPIRATORY_TRACT | Status: DC | PRN

## 2014-09-19 MED ORDER — ALBUTEROL SULFATE (2.5 MG/3ML) 0.083% IN NEBU
2.5000 mg | INHALATION_SOLUTION | RESPIRATORY_TRACT | Status: DC | PRN
  Administered 2014-09-19: 2.5 mg via RESPIRATORY_TRACT
  Filled 2014-09-19: qty 3

## 2014-09-19 MED ORDER — GLYCOPYRROLATE 0.2 MG/ML IJ SOLN
INTRAMUSCULAR | Status: AC
  Filled 2014-09-19: qty 4

## 2014-09-19 MED ORDER — OXYCODONE-ACETAMINOPHEN 5-325 MG PO TABS
1.0000 | ORAL_TABLET | ORAL | Status: DC | PRN
  Administered 2014-09-19 – 2014-09-20 (×3): 1 via ORAL
  Administered 2014-09-20: 2 via ORAL
  Filled 2014-09-19 (×3): qty 1
  Filled 2014-09-19: qty 2

## 2014-09-19 MED ORDER — PROPOFOL 10 MG/ML IV BOLUS
INTRAVENOUS | Status: AC
  Filled 2014-09-19: qty 20

## 2014-09-19 MED ORDER — LOSARTAN POTASSIUM 25 MG PO TABS
25.0000 mg | ORAL_TABLET | Freq: Every day | ORAL | Status: DC
  Administered 2014-09-19 – 2014-09-20 (×2): 25 mg via ORAL
  Filled 2014-09-19 (×3): qty 1

## 2014-09-19 MED ORDER — ONDANSETRON HCL 4 MG/2ML IJ SOLN
INTRAMUSCULAR | Status: DC | PRN
  Administered 2014-09-19: 4 mg via INTRAVENOUS

## 2014-09-19 MED ORDER — HYDROMORPHONE HCL 1 MG/ML IJ SOLN
1.0000 mg | INTRAMUSCULAR | Status: DC | PRN
  Administered 2014-09-19 (×2): 1 mg via INTRAVENOUS
  Filled 2014-09-19: qty 1

## 2014-09-19 MED ORDER — CISATRACURIUM BESYLATE 20 MG/10ML IV SOLN
INTRAVENOUS | Status: AC
  Filled 2014-09-19: qty 10

## 2014-09-19 MED ORDER — METOCLOPRAMIDE HCL 5 MG/ML IJ SOLN
INTRAMUSCULAR | Status: AC
  Filled 2014-09-19: qty 2

## 2014-09-19 MED ORDER — ACETAMINOPHEN 325 MG PO TABS: 650.0000 mg | ORAL_TABLET | ORAL | Status: DC | PRN

## 2014-09-19 MED ORDER — CISATRACURIUM BESYLATE (PF) 10 MG/5ML IV SOLN
INTRAVENOUS | Status: DC | PRN
  Administered 2014-09-19: 6 mg via INTRAVENOUS
  Administered 2014-09-19: 3 mg via INTRAVENOUS

## 2014-09-19 MED ORDER — CEFAZOLIN SODIUM-DEXTROSE 2-3 GM-% IV SOLR: 2.0000 g | INTRAVENOUS | Status: DC

## 2014-09-19 MED ORDER — DEXAMETHASONE SODIUM PHOSPHATE 10 MG/ML IJ SOLN
INTRAMUSCULAR | Status: AC
  Filled 2014-09-19: qty 1

## 2014-09-19 MED ORDER — CALCIUM CARBONATE 1250 (500 CA) MG PO TABS
2.0000 | ORAL_TABLET | Freq: Three times a day (TID) | ORAL | Status: DC
  Administered 2014-09-19 – 2014-09-20 (×4): 1000 mg via ORAL
  Filled 2014-09-19 (×6): qty 2

## 2014-09-19 MED ORDER — GLYCOPYRROLATE 0.2 MG/ML IJ SOLN
INTRAMUSCULAR | Status: DC | PRN
  Administered 2014-09-19: .8 mg via INTRAVENOUS

## 2014-09-19 MED ORDER — MIDAZOLAM HCL 2 MG/2ML IJ SOLN
INTRAMUSCULAR | Status: AC
  Filled 2014-09-19: qty 2

## 2014-09-19 MED ORDER — HYDROMORPHONE HCL 1 MG/ML IJ SOLN
INTRAMUSCULAR | Status: AC
  Administered 2014-09-19: 1 mg via INTRAVENOUS
  Filled 2014-09-19: qty 1

## 2014-09-19 MED ORDER — CYCLOSPORINE 0.05 % OP EMUL
2.0000 [drp] | Freq: Two times a day (BID) | OPHTHALMIC | Status: DC
  Filled 2014-09-19 (×4): qty 1

## 2014-09-19 MED ORDER — AMITRIPTYLINE HCL 10 MG PO TABS
20.0000 mg | ORAL_TABLET | Freq: Every day | ORAL | Status: DC
  Filled 2014-09-19 (×2): qty 2

## 2014-09-19 SURGICAL SUPPLY — 42 items
APL SKNCLS STERI-STRIP NONHPOA (GAUZE/BANDAGES/DRESSINGS) ×1
ATTRACTOMAT 16X20 MAGNETIC DRP (DRAPES) ×2 IMPLANT
BENZOIN TINCTURE PRP APPL 2/3 (GAUZE/BANDAGES/DRESSINGS) ×2 IMPLANT
BLADE HEX COATED 2.75 (ELECTRODE) ×2 IMPLANT
BLADE SURG 15 STRL LF DISP TIS (BLADE) ×1 IMPLANT
BLADE SURG 15 STRL SS (BLADE) ×2
CANISTER SUCTION 2500CC (MISCELLANEOUS) ×1 IMPLANT
CHLORAPREP W/TINT 26ML (MISCELLANEOUS) ×2 IMPLANT
CLIP TI MEDIUM 6 (CLIP) ×5 IMPLANT
CLIP TI WIDE RED SMALL 6 (CLIP) ×8 IMPLANT
DISSECTOR ROUND CHERRY 3/8 STR (MISCELLANEOUS) IMPLANT
DRAPE PED LAPAROTOMY (DRAPES) ×2 IMPLANT
DRESSING SURGICEL FIBRLLR 1X2 (HEMOSTASIS) ×1 IMPLANT
DRSG SURGICEL FIBRILLAR 1X2 (HEMOSTASIS) ×2
ELECT REM PT RETURN 9FT ADLT (ELECTROSURGICAL) ×2
ELECTRODE REM PT RTRN 9FT ADLT (ELECTROSURGICAL) ×1 IMPLANT
GAUZE SPONGE 4X4 12PLY STRL (GAUZE/BANDAGES/DRESSINGS) ×1 IMPLANT
GAUZE SPONGE 4X4 16PLY XRAY LF (GAUZE/BANDAGES/DRESSINGS) ×3 IMPLANT
GLOVE BIOGEL PI IND STRL 7.5 (GLOVE) IMPLANT
GLOVE BIOGEL PI INDICATOR 7.5 (GLOVE) ×1
GLOVE SURG ORTHO 8.0 STRL STRW (GLOVE) ×3 IMPLANT
GLOVE SURG SS PI 7.5 STRL IVOR (GLOVE) ×1 IMPLANT
GOWN STRL REUS W/TWL XL LVL3 (GOWN DISPOSABLE) ×5 IMPLANT
KIT BASIN OR (CUSTOM PROCEDURE TRAY) ×2 IMPLANT
MANIFOLD NEPTUNE II (INSTRUMENTS) ×1 IMPLANT
NS IRRIG 1000ML POUR BTL (IV SOLUTION) ×2 IMPLANT
PACK BASIC VI WITH GOWN DISP (CUSTOM PROCEDURE TRAY) ×2 IMPLANT
PENCIL BUTTON HOLSTER BLD 10FT (ELECTRODE) ×2 IMPLANT
SHEARS HARMONIC 9CM CVD (BLADE) ×2 IMPLANT
STAPLER VISISTAT 35W (STAPLE) IMPLANT
STRIP CLOSURE SKIN 1/2X4 (GAUZE/BANDAGES/DRESSINGS) ×2 IMPLANT
SUT MNCRL AB 4-0 PS2 18 (SUTURE) ×2 IMPLANT
SUT SILK 2 0 (SUTURE)
SUT SILK 2-0 18XBRD TIE 12 (SUTURE) IMPLANT
SUT SILK 3 0 (SUTURE) ×2
SUT SILK 3-0 18XBRD TIE 12 (SUTURE) IMPLANT
SUT VIC AB 3-0 SH 18 (SUTURE) ×4 IMPLANT
SYR BULB IRRIGATION 50ML (SYRINGE) ×2 IMPLANT
TAPE CLOTH SURG 4X10 WHT LF (GAUZE/BANDAGES/DRESSINGS) ×1 IMPLANT
TOWEL OR 17X26 10 PK STRL BLUE (TOWEL DISPOSABLE) ×2 IMPLANT
TOWEL OR NON WOVEN STRL DISP B (DISPOSABLE) ×2 IMPLANT
YANKAUER SUCT BULB TIP 10FT TU (MISCELLANEOUS) ×2 IMPLANT

## 2014-09-19 NOTE — Anesthesia Postprocedure Evaluation (Signed)
  Anesthesia Post-op Note  Patient: Rhonda Bradley  Procedure(s) Performed: Procedure(s) (LRB): THYROIDECTOMY (N/A)  Patient Location: PACU  Anesthesia Type: General  Level of Consciousness: awake and alert   Airway and Oxygen Therapy: Patient Spontanous Breathing  Post-op Pain: mild  Post-op Assessment: Post-op Vital signs reviewed, Patient's Cardiovascular Status Stable, Respiratory Function Stable, Patent Airway and No signs of Nausea or vomiting  Last Vitals:  Filed Vitals:   09/19/14 0945  BP:   Pulse:   Temp: 36.7 C  Resp: 21    Post-op Vital Signs: stable   Complications: No apparent anesthesia complications

## 2014-09-19 NOTE — Interval H&P Note (Signed)
History and Physical Interval Note:  09/19/2014 7:33 AM  Rhonda Bradley  has presented today for surgery, with the diagnosis of Thyroid Goiter.  The various methods of treatment have been discussed with the patient and family. After consideration of risks, benefits and other options for treatment, the patient has consented to    Procedure(s): THYROIDECTOMY (N/A) as a surgical intervention .    The patient's history has been reviewed, patient examined, no change in status, stable for surgery.  I have reviewed the patient's chart and labs.  Questions were answered to the patient's satisfaction.    Earnstine Regal, MD, [facility] Office: [place]

## 2014-09-19 NOTE — Progress Notes (Signed)
Patient stated she could not take percocet for pain,it caused her to itch. Dr. Harlow Asa paged new order for percocet given.will continue to monitor.C.,RN

## 2014-09-19 NOTE — Op Note (Signed)
NAMELTANYA, Rhonda Bradley NO.:  [id number]  MEDICAL RECORD NO.:  [id number]  LOCATION:  [facility]                         FACILITY:  [facility]  PHYSICIAN:  Earnstine Regal, MD      DATE OF BIRTH:  [birth year]  DATE OF PROCEDURE:  09/19/2014                              OPERATIVE REPORT   PREOPERATIVE DIAGNOSIS:  Multinodular thyroid goiter with compressive symptoms.  POSTOPERATIVE DIAGNOSIS:  Multinodular thyroid goiter with compressive symptoms.  PROCEDURE:  Total thyroidectomy.  SURGEON:  Armandina Gemma, MD, FACS  ANESTHESIA:  General.  ESTIMATED BLOOD LOSS:  Minimal.  PREPARATION:  ChloraPrep.  COMPLICATIONS:  None.  INDICATIONS:  The patient is a 48 year old female referred by her endocrinologist for multinodular thyroid goiter with compressive symptoms.  The patient had undergone fine-needle aspiration biopsy which was benign.  The patient has moderate compressive symptoms including pressure sensation and globus sensation.  She now comes to Surgery for thyroidectomy.  BODY OF REPORT:  Procedure was done in OR #6 at the [facility].  The patient was brought to the operating room, placed in supine position on the operating room table.  Following administration of general anesthesia, the patient was positioned and then prepped and draped in the usual aseptic fashion.  After ascertaining that an adequate level of anesthesia had been achieved, a Kocher incision was made with a #15 blade.  Dissection was carried through subcutaneous tissues and platysma.  Hemostasis was achieved with electrocautery.  Skin flaps were elevated cephalad and caudad from the thyroid notch to the sternal notch.  A Mahorner self-retaining retractor was placed for exposure.  Strap muscles were incised in the midline and dissection was begun on the left side.  Strap muscles were reflected laterally exposing a moderately enlarged left thyroid lobe, it appears multinodular.   It was gently mobilized with blunt dissection.  Venous tributaries were divided between Ligaclips with the Harmonic scalpel. Superior pole vessels were dissected out individually and divided between small and medium Ligaclips with the Harmonic scalpel.  Gland was rolled anteriorly.  There were multiple nodules posteriorly which were gently dissected out and mobilized.  Inferior venous tributaries were divided between medium Ligaclips with the Harmonic scalpel.  Gland was rolled further anteriorly and the branches of the inferior thyroid artery were divided between small Ligaclips with the Harmonic scalpel. Ligament of Gwenlyn Found was released with the electrocautery and the gland was mobilized onto the anterior trachea.  Isthmus was mobilized across the midline.  There was a moderate-sized pyramidal lobe, which was also resected with the isthmus.  Dry pack was placed in the left neck.  Next, we turned our attention to the right thyroid lobe.  Again, strap muscles were reflected laterally exposing a large thyroid lobe with multiple nodules.  With gentle blunt dissection,  the lobe was mobilized.  Venous tributaries were divided between Ligaclips with the Harmonic scalpel.  Superior pole vessels were dissected out individually and divided between medium Ligaclips with the Harmonic scalpel. Inferior venous tributaries were divided between Ligaclips with the Harmonic scalpel.  Gland was rolled anteriorly and branches of the inferior thyroid artery were divided between small Ligaclips.  Tubercle of Zuckerkandl was somewhat hypertrophied and was  gently mobilized away from the underlying nerve.  Ligament of Gwenlyn Found was released with the electrocautery and the gland was mobilized onto the anterior trachea from which it was completely excised with the Harmonic scalpel.  A suture was used to mark the left superior pole of the thyroid gland. The entire gland was submitted to Pathology for review.  The  neck was irrigated with warm saline and good hemostasis was noted bilaterally.  Fibrillar was placed throughout the operative field. Strap muscles were reapproximated in the midline with interrupted 3-0 Vicryl sutures.  Platysma was closed with interrupted 3-0 Vicryl sutures.  Skin was closed with a running 4-0 Monocryl subcuticular suture.  Wound was washed and dried and benzoin and Steri-Strips were applied.  Sterile dressings were applied.  The patient was awakened from anesthesia and brought to the recovery room.  The patient tolerated the procedure well.   Earnstine Regal, MD, [facility] Office: [phone]   TMG/MEDQ  D:  09/19/2014  T:  09/19/2014  Job:  [id number]  cc:   Katina Degree, M.D. Fax: [phone]  Hal T. Stoneking, M.D. Fax: [phone]

## 2014-09-19 NOTE — Transfer of Care (Signed)
Immediate Anesthesia Transfer of Care Note  Patient: Rhonda Bradley  Procedure(s) Performed: Procedure(s): THYROIDECTOMY (N/A)  Patient Location: PACU  Anesthesia Type:General  Level of Consciousness: awake, sedated and patient cooperative  Airway & Oxygen Therapy: Patient Spontanous Breathing and Patient connected to face mask oxygen  Post-op Assessment: Report given to PACU RN and Post -op Vital signs reviewed and stable  Post vital signs: Reviewed and stable  Complications: No apparent anesthesia complications

## 2014-09-20 ENCOUNTER — Encounter (HOSPITAL_COMMUNITY): Payer: Self-pay | Admitting: Surgery

## 2014-09-20 DIAGNOSIS — E042 Nontoxic multinodular goiter: Secondary | ICD-10-CM | POA: Diagnosis not present

## 2014-09-20 LAB — BASIC METABOLIC PANEL
Anion gap: 11 (ref 5–15)
BUN: 8 mg/dL (ref 6–23)
CO2: 26 mEq/L (ref 19–32)
Calcium: 9.6 mg/dL (ref 8.4–10.5)
Chloride: 99 mEq/L (ref 96–112)
Creatinine, Ser: 0.6 mg/dL (ref 0.50–1.10)
GFR calc Af Amer: >90 mL/min (ref >90)
GFR calc non Af Amer: >90 mL/min (ref >90)
Glucose, Bld: 192 mg/dL — ABNORMAL HIGH (ref 70–99)
Potassium: 4.1 mEq/L (ref 3.7–5.3)
Sodium: 136 mEq/L — ABNORMAL LOW (ref 137–147)

## 2014-09-20 MED ORDER — SYNTHROID 88 MCG PO TABS: 88.0000 ug | ORAL_TABLET | Freq: Every day | ORAL | Status: DC

## 2014-09-20 MED ORDER — OXYCODONE-ACETAMINOPHEN 5-325 MG PO TABS: 1.0000 | ORAL_TABLET | ORAL | Status: DC | PRN

## 2014-09-20 MED ORDER — CALCIUM CARBONATE 1250 (500 CA) MG PO TABS: 2.0000 | ORAL_TABLET | Freq: Two times a day (BID) | ORAL | Status: DC

## 2014-09-20 NOTE — Discharge Summary (Signed)
Physician Discharge Summary [facility]  Patient ID: Rhonda Bradley MRN: [id number] DOB/AGE: September 21, 1966 48 y.o.  Admit date: 09/19/2014 Discharge date: 09/20/2014  Admission Diagnoses:  Multinodular goiter  Discharge Diagnoses:  Principal Problem:   Multinodular goiter (nontoxic)   Discharged Condition: good  Hospital Course: Patient was admitted for observation following thyroid surgery.  Post op course was uncomplicated.  Pain was well controlled.  Tolerated diet.  Post op calcium level on morning following surgery was 9.6 mg/dl.  Patient was prepared for discharge home on POD#1.  Consults: None  Treatments: surgery: total thyroidectomy  Discharge Exam: Blood pressure 126/76, pulse 89, temperature 99 F (37.2 C), temperature source Oral, resp. rate 15, height 5\' 7"  (1.702 m), weight 221 lb (100.245 kg), SpO2 97.00%. HEENT - clear Neck - wound clear and intact; mild ecchymosis; mild STS; voice normal Chest - clear bilaterally Cor - RRR   Disposition: Home  Discharge Instructions   Apply dressing    Complete by:  As directed   Apply light gauze dressing to wound before discharge home today.     Diet - low sodium heart healthy    Complete by:  As directed      Discharge instructions    Complete by:  As directed   THYROID & PARATHYROID SURGERY - POST OP INSTRUCTIONS  Always review your discharge instruction sheet from the facility where your surgery was performed.  A prescription for pain medication may be given to you upon discharge.  Take your pain medication as prescribed.  If narcotic pain medicine is not needed, then you may take acetaminophen (Tylenol) or ibuprofen (Advil) as needed.  Take your usually prescribed medications unless otherwise directed.  If you need a refill on your pain medication, please contact your pharmacy. They will contact our office to request authorization.  Prescriptions will not be processed after 5 pm or on  weekends.  Start with a light diet upon arrival home, such as soup and crackers or toast.  Be sure to drink plenty of fluids daily.  Resume your normal diet the day after surgery.  Most patients will experience some swelling and bruising on the chest and neck area.  Ice packs will help.  Swelling and bruising can take several days to resolve.   It is common to experience some constipation if taking pain medication after surgery.  Increasing fluid intake and taking a stool softener will usually help or prevent this problem.  A mild laxative (Milk of Magnesia or Miralax) should be taken according to package directions if there are no bowel movements after 48 hours.  You may remove your bandages 24-48 hours after surgery, and you may shower at that time.  You have steri-strips (small skin tapes) in place directly over the incision.  These strips should be left on the skin for 7-10 days and then removed.  You may resume regular (light) daily activities beginning the next day-such as daily self-care, walking, climbing stairs-gradually increasing activities as tolerated.  You may have sexual intercourse when it is comfortable.  Refrain from any heavy lifting or straining until approved by your doctor.  You may drive when you no longer are taking prescription pain medication, you can comfortably wear a seatbelt, and you can safely maneuver your car and apply brakes.  You should see your doctor in the office for a follow-up appointment approximately two to three weeks after your surgery.  Make sure that you call for this appointment within a day  or two after you arrive home to insure a convenient appointment time.  WHEN TO CALL YOUR DOCTOR: -- Fever greater than 101.5 -- Inability to urinate -- Nausea and/or vomiting - persistent -- Extreme swelling or bruising -- Continued bleeding from incision -- Increased pain, redness, or drainage from the incision -- Difficulty swallowing or breathing -- Muscle  cramping or spasms -- Numbness or tingling in hands or around lips  The clinic staff is available to answer your questions during regular business hours.  Please don't hesitate to call and ask to speak to one of the nurses if you have concerns.  Earnstine Regal, MD, [facility] Office: [phone]     Increase activity slowly    Complete by:  As directed      Remove dressing in 24 hours    Complete by:  As directed             Medication List         albuterol 108 (90 BASE) MCG/ACT inhaler  Commonly known as:  PROVENTIL HFA;VENTOLIN HFA  Inhale 2 puffs into the lungs every 4 (four) hours as needed for wheezing or shortness of breath.     amitriptyline 10 MG tablet  Commonly known as:  ELAVIL  Take 20 mg by mouth at bedtime.     beclomethasone 80 MCG/ACT inhaler  Commonly known as:  QVAR  Inhale 1-2 puffs into the lungs 2 (two) times daily as needed (shortness of breathe/cough).     calcium carbonate 1250 MG tablet  Commonly known as:  OS-CAL - dosed in mg of elemental calcium  Take 2 tablets (1,000 mg of elemental calcium total) by mouth 2 (two) times daily with a meal.     diphenhydramine-acetaminophen 25-500 MG Tabs  Commonly known as:  TYLENOL PM  Take 2 tablets by mouth at bedtime as needed (sleep).     docusate sodium 100 MG capsule  Commonly known as:  COLACE  Take 100 mg by mouth as needed for mild constipation.     FLONASE ALLERGY RELIEF 50 MCG/ACT nasal spray  Generic drug:  fluticasone  Place 1 spray into both nostrils 2 (two) times daily.     furosemide 40 MG tablet  Commonly known as:  LASIX  Take 40 mg by mouth daily. Patient takes as needed per patient     loratadine 10 MG tablet  Commonly known as:  CLARITIN  Take 10 mg by mouth every morning.     losartan 25 MG tablet  Commonly known as:  COZAAR  Take 25 mg by mouth every morning.     Melatonin 10 MG Tabs  Take 1 tablet by mouth at  bedtime.     multivitamin with minerals Tabs tablet  Take 1 tablet by mouth every morning.     NEXIUM PO  Take 1 capsule by mouth every morning.     oxyCODONE-acetaminophen 5-325 MG per tablet  Commonly known as:  PERCOCET/ROXICET  Take 1-2 tablets by mouth every 4 (four) hours as needed for moderate pain.     polyethylene glycol packet  Commonly known as:  MIRALAX / GLYCOLAX  Take 17 g by mouth daily.     ranitidine 300 MG tablet  Commonly known as:  ZANTAC  Take 300 mg by mouth daily after supper.     RESTASIS 0.05 % ophthalmic emulsion  Generic drug:  cycloSPORINE  2 drops 2 (two) times daily.  rosuvastatin 5 MG tablet  Commonly known as:  CRESTOR  Take 5 mg by mouth every morning.     SINGULAIR PO  Take 10 mg by mouth daily.     SYNTHROID 88 MCG tablet  Generic drug:  levothyroxine  Take 1 tablet (88 mcg total) by mouth daily before breakfast.     TURMERIC PO  Take 3 tablets by mouth every morning. As needed for inflammation     vitamin C 1000 MG tablet  Take 1,000 mg by mouth every morning.         Earnstine Regal, MD, [facility] Office: [phone]   Signed: Earnstine Regal 09/20/2014, 1:18 PM

## 2014-09-23 ENCOUNTER — Other Ambulatory Visit (INDEPENDENT_AMBULATORY_CARE_PROVIDER_SITE_OTHER): Payer: Self-pay

## 2014-10-08 LAB — CALCIUM: Calcium: 9.7 mg/dL (ref 8.7–10.2)

## 2014-10-09 ENCOUNTER — Other Ambulatory Visit (INDEPENDENT_AMBULATORY_CARE_PROVIDER_SITE_OTHER): Payer: Self-pay

## 2014-10-09 DIAGNOSIS — E890 Postprocedural hypothyroidism: Secondary | ICD-10-CM

## 2015-05-09 ENCOUNTER — Other Ambulatory Visit: Payer: Self-pay

## 2015-05-09 DIAGNOSIS — Z1231 Encounter for screening mammogram for malignant neoplasm of breast: Secondary | ICD-10-CM

## 2015-05-22 ENCOUNTER — Ambulatory Visit
Admission: RE | Admit: 2015-05-22 | Discharge: 2015-05-22 | Disposition: A | Discharge status: Home / Self Care | Payer: 59 | Source: Ambulatory Visit

## 2015-05-22 DIAGNOSIS — Z1231 Encounter for screening mammogram for malignant neoplasm of breast: Secondary | ICD-10-CM

## 2015-07-15 ENCOUNTER — Ambulatory Visit (INDEPENDENT_AMBULATORY_CARE_PROVIDER_SITE_OTHER): Payer: Self-pay | Admitting: Neurology

## 2015-07-15 ENCOUNTER — Encounter: Payer: Self-pay | Admitting: Neurology

## 2015-07-15 ENCOUNTER — Ambulatory Visit (INDEPENDENT_AMBULATORY_CARE_PROVIDER_SITE_OTHER): Payer: 59 | Admitting: Neurology

## 2015-07-15 DIAGNOSIS — M797 Fibromyalgia: Secondary | ICD-10-CM

## 2015-07-15 NOTE — Procedures (Signed)
     HISTORY:  Rhonda Bradley is a 49 year old patient with a greater than 10 year history of migratory discomfort involving the neck, shoulders, back, and legs. The patient currently is having increased pain involving the left shoulder and arm. The patient is being evaluated for possible neuropathy or radiculopathy.  NERVE CONDUCTION STUDIES:  Nerve conduction studies were performed on both upper extremities. The distal motor latencies and motor amplitudes for the median and ulnar nerves were within normal limits. The F wave latencies and nerve conduction velocities for these nerves were also normal. The sensory latencies for the median and ulnar nerves were normal.  Nerve conduction studies were performed on the left lower extremity. The distal motor latencies and motor amplitudes for the peroneal and posterior tibial nerves were within normal limits. The nerve conduction velocities for these nerves were also normal. The sensory latency for the peroneal nerve was within normal limits.   EMG STUDIES:  EMG study was performed on the left upper extremity:  The first dorsal interosseous muscle reveals 2 to 4 K units with full recruitment. No fibrillations or positive waves were noted. The abductor pollicis brevis muscle reveals 2 to 4 K units with full recruitment. No fibrillations or positive waves were noted. The extensor indicis proprius muscle reveals 1 to 3 K units with full recruitment. No fibrillations or positive waves were noted. The pronator teres muscle reveals 2 to 3 K units with full recruitment. No fibrillations or positive waves were noted. The biceps muscle reveals 1 to 2 K units with full recruitment. No fibrillations or positive waves were noted. The triceps muscle reveals 2 to 4 K units with full recruitment. No fibrillations or positive waves were noted. The anterior deltoid muscle reveals 2 to 3 K units with full recruitment. No fibrillations or positive waves were  noted. The cervical paraspinal muscles were tested at 2 levels. No abnormalities of insertional activity were seen at either level tested. There was good relaxation.  EMG study was performed on the left lower extremity:  The tibialis anterior muscle reveals 2 to 4K motor units with full recruitment. No fibrillations or positive waves were seen. The peroneus tertius muscle reveals 2 to 4K motor units with full recruitment. No fibrillations or positive waves were seen. The medial gastrocnemius muscle reveals 1 to 3K motor units with full recruitment. No fibrillations or positive waves were seen. The vastus lateralis muscle reveals 2 to 4K motor units with full recruitment. No fibrillations or positive waves were seen. The iliopsoas muscle reveals 2 to 4K motor units with full recruitment. No fibrillations or positive waves were seen. The biceps femoris muscle (long head) reveals 2 to 4K motor units with full recruitment. No fibrillations or positive waves were seen. The lumbosacral paraspinal muscles were tested at 3 levels, and revealed no abnormalities of insertional activity at all 3 levels tested. There was good relaxation.   IMPRESSION:  Nerve conduction studies done on the upper extremities and on the left lower extremity were within normal limits. No evidence of a neuropathy is seen. EMG evaluation of the left upper and left lower extremities were unremarkable, without evidence of an overlying radiculopathy or evidence of a myopathic disorder.  Jill Alexanders MD 07/15/2015 4:36 PM  [facility] [address] [place], [place] [address]  Phone [phone] Fax [phone]

## 2015-07-15 NOTE — Progress Notes (Signed)
Please refer to EMG and nerve conduction study procedure note. 

## 2015-07-29 ENCOUNTER — Other Ambulatory Visit (HOSPITAL_COMMUNITY)
Admission: RE | Admit: 2015-07-29 | Discharge: 2015-07-29 | Disposition: A | Discharge status: Home / Self Care | Payer: 59 | Source: Ambulatory Visit | Attending: Nurse Practitioner | Admitting: Nurse Practitioner

## 2015-07-29 ENCOUNTER — Other Ambulatory Visit: Payer: Self-pay | Admitting: Nurse Practitioner

## 2015-07-29 DIAGNOSIS — Z1151 Encounter for screening for human papillomavirus (HPV): Secondary | ICD-10-CM | POA: Insufficient documentation

## 2015-07-29 DIAGNOSIS — Z01419 Encounter for gynecological examination (general) (routine) without abnormal findings: Secondary | ICD-10-CM | POA: Diagnosis present

## 2015-07-29 DIAGNOSIS — Z113 Encounter for screening for infections with a predominantly sexual mode of transmission: Secondary | ICD-10-CM | POA: Diagnosis present

## 2015-07-31 LAB — CYTOLOGY - PAP

## 2016-08-18 ENCOUNTER — Other Ambulatory Visit: Payer: Self-pay | Admitting: Geriatric Medicine

## 2016-08-18 DIAGNOSIS — Z1231 Encounter for screening mammogram for malignant neoplasm of breast: Secondary | ICD-10-CM

## 2016-08-23 ENCOUNTER — Ambulatory Visit
Admission: RE | Admit: 2016-08-23 | Discharge: 2016-08-23 | Disposition: A | Discharge status: Home / Self Care | Payer: 59 | Source: Ambulatory Visit | Attending: Geriatric Medicine | Admitting: Geriatric Medicine

## 2016-08-23 DIAGNOSIS — Z1231 Encounter for screening mammogram for malignant neoplasm of breast: Secondary | ICD-10-CM

## 2016-09-22 ENCOUNTER — Other Ambulatory Visit: Payer: Self-pay | Admitting: Geriatric Medicine

## 2016-09-22 DIAGNOSIS — M542 Cervicalgia: Secondary | ICD-10-CM

## 2016-09-30 ENCOUNTER — Ambulatory Visit
Admission: RE | Admit: 2016-09-30 | Discharge: 2016-09-30 | Disposition: A | Discharge status: Home / Self Care | Payer: 59 | Source: Ambulatory Visit | Attending: Geriatric Medicine | Admitting: Geriatric Medicine

## 2016-09-30 ENCOUNTER — Other Ambulatory Visit: Payer: Self-pay | Admitting: Geriatric Medicine

## 2016-09-30 DIAGNOSIS — M542 Cervicalgia: Secondary | ICD-10-CM

## 2016-10-25 ENCOUNTER — Encounter: Payer: Self-pay | Admitting: Physical Therapy

## 2016-10-25 ENCOUNTER — Ambulatory Visit: Payer: 59 | Attending: Geriatric Medicine | Admitting: Physical Therapy

## 2016-10-25 DIAGNOSIS — M542 Cervicalgia: Secondary | ICD-10-CM | POA: Diagnosis not present

## 2016-10-25 DIAGNOSIS — M62838 Other muscle spasm: Secondary | ICD-10-CM | POA: Diagnosis present

## 2016-10-25 NOTE — Therapy (Signed)
[facility] [address], [place], [place], [address] Phone: [phone]   Fax:  [phone]  Physical Therapy Evaluation  Patient Details  Name: Rhonda Bradley MRN: [id number] Date of Birth: [birth year] Referring Provider: Lajean Manes MD  Encounter Date: 10/25/2016      PT End of Session - 10/25/16 1614    Visit Number 1   Date for PT Re-Evaluation 12/20/16   PT Start Time 1615   PT Stop Time 1701   PT Time Calculation (min) 46 min   Activity Tolerance Patient tolerated treatment well   Behavior During Therapy [facility] for tasks assessed/performed      Past Medical History:  Diagnosis Date  . Anemia    as a child   . Anxiety   . Asthma   . Complication of anesthesia    slow to wake up age 81, but since cholecystectomy and no problems   . Depression   . Family history of anesthesia complication    father slow to wake up   . Fibromyalgia   . GERD (gastroesophageal reflux disease)   . Gestational diabetes   . Headache   . Hypertension     Past Surgical History:  Procedure Laterality Date  . CESAREAN SECTION     x 3  . CHOLECYSTECTOMY    . THYROIDECTOMY N/A 09/19/2014   Procedure: THYROIDECTOMY;  Surgeon: Armandina Gemma, MD;  Location: [facility];  Service: General;  Laterality: N/A;    There were no vitals filed for this visit.       Subjective Assessment - 10/25/16 1621    Subjective Pt presents to clinic states she has fibromyalgia and neck pain has been going on for a long time.  Pain down the arm has been going on for 6-12 months.  States it has gotten better since starting savella.  symptoms are unstable and can't connect it to any activity   Limitations --   How long can you sit comfortably? unable to determine   How long can you stand comfortably? limited by fatigue, variable   How long can you walk comfortably? limited by fatigue, variable   Patient Stated Goals decreased pain   Currently  in Pain? Yes   Pain Score 6    Pain Location Neck   Pain Orientation Right;Left   Pain Descriptors / Indicators Aching;Throbbing   Pain Type Chronic pain   Pain Radiating Towards Rt arm   Pain Onset More than a month ago   Pain Frequency Constant   Aggravating Factors  unknown   Pain Relieving Factors walking   Effect of Pain on Daily Activities limiting exercises   Multiple Pain Sites No            [facility] PT Assessment - 10/25/16 0001      Assessment   Medical Diagnosis M54.2   Referring Provider Lajean Manes MD   Onset Date/Surgical Date --  2000 but arm was about 6-12 months ago   Hand Dominance Right   Next MD Visit 03/2017   Prior Therapy no     Precautions   Precautions None     Restrictions   Weight Bearing Restrictions No     Balance Screen   Has the patient fallen in the past 6 months No  tripped on treadmill beause she was walking and rubbing eyes   Has the patient had a decrease in activity level because of a fear of falling?  No   Is the  patient reluctant to leave their home because of a fear of falling?  No     Home Ecologist residence     Prior Function   Level of Independence Independent   Vocation Full time employment   Vocation Requirements sitting and typing     Cognition   Overall Cognitive Status Within Functional Limits for tasks assessed     Observation/Other Assessments   Focus on Therapeutic Outcomes (FOTO)  47% limitation     Posture/Postural Control   Posture/Postural Control No significant limitations   Posture Comments sits leaning back in chair, appears fatigued     AROM   Overall AROM Comments AROM WFL, all painful   AROM Assessment Site Cervical;Shoulder   Right/Left Shoulder --  WFL   Cervical Flexion 45   Cervical - Right Side Bend 60   Cervical - Left Side Bend 45   Cervical - Right Rotation 70   Cervical - Left Rotation 80     Strength   Strength Assessment Site Shoulder    Right/Left Shoulder --  Rt flexion, abduction 4-/5; L abduction 4-/5   Right/Left hand --  Rt grip 33; Lt grip 38     Palpation   Palpation comment Rt cervical paraspinals and scalenes tight, pecs tender to palpation     Special Tests   Thoracic Outlet Syndrome  Arms overhead     Spurling's   Findings Negative   Side Right   Comment --  increased pain bilat with cervical compression     Distraction Test   Findngs Negative   Comment --  no decrease in pain     Arms overhead    Findings Negative   Comment --  both sides negative                   [facility] Adult PT Treatment/Exercise - 10/25/16 0001      Manual Therapy   Manual Therapy Soft tissue mobilization;Manual Traction   Soft tissue mobilization scalenses, upper traps, Rt deltoid, Rt. pecs   Manual Traction cervical                 PT Education - 10/25/16 1942    Education provided No          PT Short Term Goals - 10/25/16 2029      PT SHORT TERM GOAL #1   Title independent with initial HEP   Time 4   Period Weeks   Status New     PT SHORT TERM GOAL #2   Title perform AROM with 50% less pain   Time 4   Period Weeks   Status New           PT Long Term Goals - 10/25/16 1952      PT LONG TERM GOAL #1   Title return to lifting light weights as part of exericse routine at least 2x per week   Time 8   Period Weeks   Status New     PT LONG TERM GOAL #2   Title FOTO < or + 36% limitation   Baseline 47% limitation   Time 8   Period Weeks   Status New     PT LONG TERM GOAL #3   Title reports 75% decrease in pain throughout the day due to increased ability to maintain good posture at work.   Time 8   Period Weeks   Status New  Plan - 10/25/16 2105    Clinical Impression Statement Pt was seen for moderate complexity eval due to comorbidities such as fibromyalgia, chronic pain and fatigue.  Pt has unstable condition, and pt unable to predict symptom  response.  Pt also has more than one regions that needed to be examined including neck and shoulders and wrist.  Pt demonstrates poor posture in sitting, decreased endurance demonstrated  by inability to maintain posture, decreased strength with pain and 4-/5 Rt shoulder MMT.  Pt demonstrates 6/10 pain and increased pain with all cervical movements.  Pt's muscle spasms appear to be causing increased neck pain as well.  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   Rehab Potential Good   Clinical Impairments Affecting Rehab Potential fibromyalgia, fatigue   PT Frequency 2x / week   PT Duration 8 weeks   PT Treatment/Interventions Passive range of motion;Neuromuscular re-education;Moist Heat;Manual techniques;Energy conservation;Patient/family education;Therapeutic activities;Therapeutic exercise;Cryotherapy;Traction   PT Next Visit Plan education on sitting posture, manual/cervical traction, soft tissue mobs, scap squeezes postural strengthening   PT Home Exercise Plan progress as needed, initiate stretches and scap squeezes and towel roll next session   Recommended Other Services none   Consulted and Agree with Plan of Care Patient      Patient will benefit from skilled therapeutic intervention in order to improve the following deficits and impairments:  Pain, Increased muscle spasms, Decreased activity tolerance, Decreased endurance, Decreased range of motion, Decreased strength, Impaired UE functional use  Visit Diagnosis: Cervicalgia - Plan: PT plan of care cert/re-cert  Other muscle spasm - Plan: PT plan of care cert/re-cert     Problem List Patient Active Problem List   Diagnosis Date Noted  . Fibromyalgia 07/15/2015  . Multinodular goiter (nontoxic) 09/18/2014    Zannie Cove, PT 10/25/2016, 9:23 PM  [facility] [address], [place], [place], [address] Phone: [phone]   Fax:  [phone]  Name: Rhonda Bradley MRN: [id number] Date of Birth: Mar 26, 1966

## 2016-11-02 ENCOUNTER — Ambulatory Visit: Payer: 59 | Admitting: Physical Therapy

## 2016-11-02 ENCOUNTER — Encounter: Payer: Self-pay | Admitting: Physical Therapy

## 2016-11-02 DIAGNOSIS — M62838 Other muscle spasm: Secondary | ICD-10-CM

## 2016-11-02 DIAGNOSIS — M542 Cervicalgia: Secondary | ICD-10-CM

## 2016-11-02 NOTE — Therapy (Signed)
[facility] [address] [place], [place], [address] Phone: [phone]   Fax:  [phone]  Physical Therapy Treatment  Patient Details  Name: Rhonda Bradley MRN: [id number] Date of Birth: 22-Mar-1966 Referring Provider: Lajean Manes MD  Encounter Date: 11/02/2016      PT End of Session - 11/02/16 1620    Visit Number 2   Date for PT Re-Evaluation 12/20/16   PT Start Time [id number]   PT Stop Time 1701   PT Time Calculation (min) 47 min   Activity Tolerance Patient tolerated treatment well   Behavior During Therapy [facility] for tasks assessed/performed      Past Medical History:  Diagnosis Date  . Anemia    as a child   . Anxiety   . Asthma   . Complication of anesthesia    slow to wake up age 11, but since cholecystectomy and no problems   . Depression   . Family history of anesthesia complication    father slow to wake up   . Fibromyalgia   . GERD (gastroesophageal reflux disease)   . Gestational diabetes   . Headache   . Hypertension     Past Surgical History:  Procedure Laterality Date  . CESAREAN SECTION     x 3  . CHOLECYSTECTOMY    . THYROIDECTOMY N/A 09/19/2014   Procedure: THYROIDECTOMY;  Surgeon: Armandina Gemma, MD;  Location: [facility];  Service: General;  Laterality: N/A;    There were no vitals filed for this visit.      Subjective Assessment - 11/02/16 1617    Subjective Pt    Currently in Pain? Yes   Pain Score 3    Pain Location Neck   Pain Orientation Right;Left   Pain Descriptors / Indicators Aching;Throbbing   Pain Type Chronic pain   Pain Onset More than a month ago   Pain Frequency Constant                         [facility] Adult PT Treatment/Exercise - 11/02/16 0001      Exercises   Exercises Shoulder     Shoulder Exercises: Supine   Horizontal ABduction Strengthening;20 reps  Rolled towel behind back; red tband   Other Supine Exercises Shoulder  extension 20 red tabnd  rolled towel behind back     Shoulder Exercises: Seated   Extension Strengthening;Both;20 reps  red band   Row Strengthening;Both;20 reps  Red tband   External Rotation Strengthening;Both;20 reps  red tband     Manual Therapy   Manual Therapy Soft tissue mobilization;Manual Traction   Manual therapy comments Pt supine and prone   Soft tissue mobilization Sub occipitals; thoracic paraspinals, upper trap, rhomboids                PT Education - 11/02/16 1632    Education provided Yes   Education Details posture education   Person(s) Educated Patient   Methods Explanation;Demonstration;Handout   Comprehension Verbalized understanding          PT Short Term Goals - 11/02/16 1709      PT SHORT TERM GOAL #1   Title independent with initial HEP   Time 4   Period Weeks   Status On-going     PT SHORT TERM GOAL #2   Title perform AROM with 50% less pain   Time 4   Period Weeks   Status On-going  PT Long Term Goals - 11/02/16 1710      PT LONG TERM GOAL #1   Title return to lifting light weights as part of exericse routine at least 2x per week   Time 8   Period Weeks   Status On-going     PT LONG TERM GOAL #2   Title FOTO < or + 36% limitation   Baseline 47% limitation   Time 8   Period Weeks   Status On-going     PT LONG TERM GOAL #3   Title reports 75% decrease in pain throughout the day due to increased ability to maintain good posture at work.   Time 8   Period Weeks   Status On-going               Plan - 11/02/16 1620    Clinical Impression Statement Pt presents with cervical pain and radiculopathy down Rt UE. Pt used to be very active and exercise at gym but fibromyaligia has made pt stop. Pt does workout on treadmill every morning. Pt educated in proper posture with all positions and activities. Pt will continue to benefit from skilled therapy for upper back strengthening and pain management.    Rehab  Potential Good   Clinical Impairments Affecting Rehab Potential fibromyalgia, fatigue   PT Frequency 2x / week   PT Duration 8 weeks   PT Treatment/Interventions Passive range of motion;Neuromuscular re-education;Moist Heat;Manual techniques;Energy conservation;Patient/family education;Therapeutic activities;Therapeutic exercise;Cryotherapy;Traction   PT Next Visit Plan Upper back strengthening, modalities as needed.    Consulted and Agree with Plan of Care Patient      Patient will benefit from skilled therapeutic intervention in order to improve the following deficits and impairments:  Pain, Increased muscle spasms, Decreased activity tolerance, Decreased endurance, Decreased range of motion, Decreased strength, Impaired UE functional use  Visit Diagnosis: Cervicalgia  Other muscle spasm     Problem List Patient Active Problem List   Diagnosis Date Noted  . Fibromyalgia 07/15/2015  . Multinodular goiter (nontoxic) 09/18/2014    [name] PTA 11/02/2016, 5:11 PM  [facility] [address] [place], [place], [address] Phone: [phone]   Fax:  [phone]  Name: Rhonda Bradley MRN: [id number] Date of Birth: [birth year]

## 2016-11-02 NOTE — Patient Instructions (Signed)

## 2016-11-04 ENCOUNTER — Encounter: Payer: Self-pay | Admitting: Physical Therapy

## 2016-11-04 ENCOUNTER — Ambulatory Visit: Payer: 59 | Admitting: Physical Therapy

## 2016-11-04 DIAGNOSIS — M542 Cervicalgia: Secondary | ICD-10-CM | POA: Diagnosis not present

## 2016-11-04 DIAGNOSIS — M62838 Other muscle spasm: Secondary | ICD-10-CM

## 2016-11-04 NOTE — Therapy (Signed)
[facility] [address], [place], [place], [address] Phone: [phone]   Fax:  [phone]  Physical Therapy Treatment  Patient Details  Name: Rhonda Bradley MRN: [id number] Date of Birth: 1966-03-13 Referring Provider: Lajean Manes MD  Encounter Date: 11/04/2016      PT End of Session - 11/04/16 1623    Visit Number 3   Date for PT Re-Evaluation 12/20/16   PT Start Time [id number]   PT Stop Time 1710   PT Time Calculation (min) 53 min   Activity Tolerance Patient tolerated treatment well   Behavior During Therapy [facility] for tasks assessed/performed      Past Medical History:  Diagnosis Date  . Anemia    as a child   . Anxiety   . Asthma   . Complication of anesthesia    slow to wake up age 50, but since cholecystectomy and no problems   . Depression   . Family history of anesthesia complication    father slow to wake up   . Fibromyalgia   . GERD (gastroesophageal reflux disease)   . Gestational diabetes   . Headache   . Hypertension     Past Surgical History:  Procedure Laterality Date  . CESAREAN SECTION     x 3  . CHOLECYSTECTOMY    . THYROIDECTOMY N/A 09/19/2014   Procedure: THYROIDECTOMY;  Surgeon: Armandina Gemma, MD;  Location: [facility];  Service: General;  Laterality: N/A;    There were no vitals filed for this visit.      Subjective Assessment - 11/04/16 1621    Subjective Pt reports having some increase muscle tightness after last session but feeling ok.    How long can you sit comfortably? unable to determine   How long can you stand comfortably? limited by fatigue, variable   How long can you walk comfortably? limited by fatigue, variable   Patient Stated Goals decreased pain   Currently in Pain? Yes   Pain Score 3    Pain Location Neck   Pain Orientation Right;Left   Pain Descriptors / Indicators Aching;Throbbing   Pain Type Chronic pain   Pain Onset More than a month ago   Pain  Frequency Constant                         [facility] Adult PT Treatment/Exercise - 11/04/16 0001      Shoulder Exercises: Supine   Horizontal ABduction Strengthening;20 reps  Rolled towel behind back; red tband   Other Supine Exercises Shoulder extension 20 red tabnd  rolled towel behind back     Shoulder Exercises: Prone   Extension Strengthening;Both;20 reps   Other Prone Exercises Alt arm leg x10     Shoulder Exercises: Standing   Extension Strengthening;Both;20 reps  #15   Row Strengthening;Both;20 reps  #15     Shoulder Exercises: Stretch   Cross Chest Stretch 2 reps;10 seconds  Seated chest stretch   Other Shoulder Stretches Uppter trap stretch, levater trap stretch     Modalities   Modalities Traction     Traction   Type of Traction Cervical   Min (lbs) 5   Max (lbs) 20   Hold Time 60   Rest Time 10   Time 15                  PT Short Term Goals - 11/02/16 1709      PT SHORT TERM  GOAL #1   Title independent with initial HEP   Time 4   Period Weeks   Status On-going     PT SHORT TERM GOAL #2   Title perform AROM with 50% less pain   Time 4   Period Weeks   Status On-going           PT Long Term Goals - 11/02/16 1710      PT LONG TERM GOAL #1   Title return to lifting light weights as part of exericse routine at least 2x per week   Time 8   Period Weeks   Status On-going     PT LONG TERM GOAL #2   Title FOTO < or + 36% limitation   Baseline 47% limitation   Time 8   Period Weeks   Status On-going     PT LONG TERM GOAL #3   Title reports 75% decrease in pain throughout the day due to increased ability to maintain good posture at work.   Time 8   Period Weeks   Status On-going               Plan - 11/04/16 1645    Clinical Impression Statement Pt presents with Bil neck pain radiating to shoulder and arms. Pt reports some muscle soreness after last session but feeling ok. Pt able to tolerate all  strengthening and stretching well. Reported some back pain with laying supine for cervical traction but responded well to traction. May try less time next visit. Pt will continue to benefit from skilled therapy for postural training and strengthening.    Rehab Potential Good   Clinical Impairments Affecting Rehab Potential fibromyalgia, fatigue   PT Frequency 2x / week   PT Duration 8 weeks   PT Treatment/Interventions Passive range of motion;Neuromuscular re-education;Moist Heat;Manual techniques;Energy conservation;Patient/family education;Therapeutic activities;Therapeutic exercise;Cryotherapy;Traction;Electrical Stimulation   PT Next Visit Plan Asses responce to cervical traction, postural strengthening   Consulted and Agree with Plan of Care Patient      Patient will benefit from skilled therapeutic intervention in order to improve the following deficits and impairments:  Pain, Increased muscle spasms, Decreased activity tolerance, Decreased endurance, Decreased range of motion, Decreased strength, Impaired UE functional use  Visit Diagnosis: Cervicalgia - Plan: PT plan of care cert/re-cert  Other muscle spasm - Plan: PT plan of care cert/re-cert     Problem List Patient Active Problem List   Diagnosis Date Noted  . Fibromyalgia 07/15/2015  . Multinodular goiter (nontoxic) 09/18/2014    [name], PTA 11/04/16 5:19 PM [facility] [address] [place], [place], [address] Phone: [phone]   Fax:  [phone]  Name: Rhonda Bradley MRN: [id number] Date of Birth: 1965/12/17

## 2016-11-09 ENCOUNTER — Ambulatory Visit: Payer: 59 | Attending: Geriatric Medicine | Admitting: Physical Therapy

## 2016-11-09 ENCOUNTER — Encounter: Payer: Self-pay | Admitting: Physical Therapy

## 2016-11-09 DIAGNOSIS — M542 Cervicalgia: Secondary | ICD-10-CM | POA: Diagnosis not present

## 2016-11-09 DIAGNOSIS — M62838 Other muscle spasm: Secondary | ICD-10-CM

## 2016-11-09 NOTE — Therapy (Signed)
[facility] [address], [place], [place], [address] Phone: [phone]   Fax:  [phone]  Physical Therapy Treatment  Patient Details  Name: Rhonda Bradley MRN: [id number] Date of Birth: 1966-03-05 Referring Provider: Lajean Manes MD  Encounter Date: 11/09/2016      PT End of Session - 11/09/16 1625    Visit Number 4   Date for PT Re-Evaluation 12/20/16   PT Start Time 1615   PT Stop Time 1700   PT Time Calculation (min) 45 min   Activity Tolerance Patient tolerated treatment well   Behavior During Therapy [facility] for tasks assessed/performed      Past Medical History:  Diagnosis Date  . Anemia    as a child   . Anxiety   . Asthma   . Complication of anesthesia    slow to wake up age 50, but since cholecystectomy and no problems   . Depression   . Family history of anesthesia complication    father slow to wake up   . Fibromyalgia   . GERD (gastroesophageal reflux disease)   . Gestational diabetes   . Headache   . Hypertension     Past Surgical History:  Procedure Laterality Date  . CESAREAN SECTION     x 3  . CHOLECYSTECTOMY    . THYROIDECTOMY N/A 09/19/2014   Procedure: THYROIDECTOMY;  Surgeon: Armandina Gemma, MD;  Location: [facility];  Service: General;  Laterality: N/A;    There were no vitals filed for this visit.      Subjective Assessment - 11/09/16 1620    Subjective Pt unsure of how cervical traction did at last visit so will try again today. Reports having some shoulder tightness today possibly due to exercises.    How long can you sit comfortably? unable to determine   How long can you stand comfortably? limited by fatigue, variable   How long can you walk comfortably? limited by fatigue, variable   Patient Stated Goals decreased pain   Currently in Pain? Yes   Pain Score 3    Pain Location Neck   Pain Orientation Right;Left   Pain Descriptors / Indicators Aching;Throbbing   Pain Type Chronic pain   Pain Onset More than a month ago   Pain Frequency Constant                         [facility] Adult PT Treatment/Exercise - 11/09/16 0001      Exercises   Exercises Neck     Neck Exercises: Supine   Neck Retraction 20 reps     Shoulder Exercises: Supine   Horizontal ABduction Strengthening;20 reps  Rolled towel behind back; yellow tband   Theraband Level (Shoulder Horizontal ABduction) Level 1 (Yellow)   External Rotation Strengthening;Both;20 reps;Theraband   Theraband Level (Shoulder External Rotation) Level 1 (Yellow)   Flexion AAROM;Both;10 reps  With cane   Other Supine Exercises Shoulder flexion tband  Narrow and wide grip, x5 each     Traction   Type of Traction Cervical   Min (lbs) 5   Max (lbs) 20   Hold Time 60   Rest Time 10   Time 10  Pt limited tolerance for laying on back     Manual Therapy   Manual Therapy Soft tissue mobilization;Manual Traction   Manual therapy comments Prone   Soft tissue mobilization Bil thoracic paraspinals  PT Short Term Goals - 11/02/16 1709      PT SHORT TERM GOAL #1   Title independent with initial HEP   Time 4   Period Weeks   Status On-going     PT SHORT TERM GOAL #2   Title perform AROM with 50% less pain   Time 4   Period Weeks   Status On-going           PT Long Term Goals - 11/09/16 1653      PT LONG TERM GOAL #1   Title return to lifting light weights as part of exericse routine at least 2x per week   Time 8   Period Weeks   Status On-going     PT LONG TERM GOAL #2   Title FOTO < or + 36% limitation   Baseline 47% limitation   Time 8   Period Weeks   Status On-going     PT LONG TERM GOAL #3   Title reports 75% decrease in pain throughout the day due to increased ability to maintain good posture at work.   Time 8   Period Weeks   Status On-going               Plan - 11/09/16 1702    Clinical Impression Statement Pt  reports back feeling about the same. Having some increase tightness from strenghening exercises. Responded better to cervical traction today but only able to tolerate 10 minutes due to low back pain with laying supine. Pt wil continue to benefit from skilled therapy for postural strengthening and pain managment.    Rehab Potential Good   Clinical Impairments Affecting Rehab Potential fibromyalgia, fatigue   PT Frequency 2x / week   PT Duration 8 weeks   PT Treatment/Interventions Passive range of motion;Neuromuscular re-education;Moist Heat;Manual techniques;Energy conservation;Patient/family education;Therapeutic activities;Therapeutic exercise;Cryotherapy;Traction;Electrical Stimulation   PT Next Visit Plan Postural strengthening and stretching   Consulted and Agree with Plan of Care Patient      Patient will benefit from skilled therapeutic intervention in order to improve the following deficits and impairments:  Pain, Increased muscle spasms, Decreased activity tolerance, Decreased endurance, Decreased range of motion, Decreased strength, Impaired UE functional use  Visit Diagnosis: Cervicalgia  Other muscle spasm     Problem List Patient Active Problem List   Diagnosis Date Noted  . Fibromyalgia 07/15/2015  . Multinodular goiter (nontoxic) 09/18/2014    [name] PTA 11/09/2016, 5:04 PM  [facility] [address] [place], [place], [address] Phone: [phone]   Fax:  [phone]  Name: Rhonda Bradley MRN: [id number] Date of Birth: [birth year]

## 2016-11-11 ENCOUNTER — Encounter: Payer: Self-pay | Admitting: Physical Therapy

## 2016-11-11 ENCOUNTER — Ambulatory Visit: Payer: 59 | Admitting: Physical Therapy

## 2016-11-11 DIAGNOSIS — M542 Cervicalgia: Secondary | ICD-10-CM | POA: Diagnosis not present

## 2016-11-11 DIAGNOSIS — M62838 Other muscle spasm: Secondary | ICD-10-CM

## 2016-11-11 NOTE — Therapy (Signed)
[facility] [address] [place], [place], [address] Phone: [phone]   Fax:  [phone]  Physical Therapy Treatment  Patient Details  Name: Rhonda Bradley MRN: [id number] Date of Birth: Oct 05, 1966 Referring Provider: Lajean Manes MD  Encounter Date: 11/11/2016      PT End of Session - 11/11/16 1620    Visit Number 5   Date for PT Re-Evaluation 12/20/16   PT Start Time 1615   PT Stop Time 1702   PT Time Calculation (min) 47 min   Activity Tolerance Patient tolerated treatment well   Behavior During Therapy [facility] for tasks assessed/performed      Past Medical History:  Diagnosis Date  . Anemia    as a child   . Anxiety   . Asthma   . Complication of anesthesia    slow to wake up age 77, but since cholecystectomy and no problems   . Depression   . Family history of anesthesia complication    father slow to wake up   . Fibromyalgia   . GERD (gastroesophageal reflux disease)   . Gestational diabetes   . Headache   . Hypertension     Past Surgical History:  Procedure Laterality Date  . CESAREAN SECTION     x 3  . CHOLECYSTECTOMY    . THYROIDECTOMY N/A 09/19/2014   Procedure: THYROIDECTOMY;  Surgeon: Armandina Gemma, MD;  Location: [facility];  Service: General;  Laterality: N/A;    There were no vitals filed for this visit.      Subjective Assessment - 11/11/16 1619    Subjective Pt reports tightness from low back up to back of neck with head aches. Feels tight and is trying to stretch but sometimes stretching making it worse.    How long can you sit comfortably? unable to determine   How long can you stand comfortably? limited by fatigue, variable   How long can you walk comfortably? limited by fatigue, variable   Patient Stated Goals decreased pain   Currently in Pain? Yes   Pain Score 6    Pain Location Neck   Pain Orientation Right;Left   Pain Descriptors / Indicators Aching;Throbbing   Pain Type Chronic pain   Pain Radiating Towards Rt arm   Pain Onset More than a month ago   Pain Frequency Constant                         [facility] Adult PT Treatment/Exercise - 11/11/16 0001      Shoulder Exercises: Seated   Other Seated Exercises Chest press  3x10 1 plate     Shoulder Exercises: Standing   External Rotation Strengthening;Both  #10 3x10   Internal Rotation Strengthening;Both  #15 3x10   Extension Strengthening;Both;20 reps  #20 3x10   Row Strengthening;Both;20 reps  #20 3x10   Retraction Strengthening;Both  #15 3x10     Manual Therapy   Manual Therapy Soft tissue mobilization   Manual therapy comments Seated   Soft tissue mobilization Bil upper traps rhomboids                  PT Short Term Goals - 11/02/16 1709      PT SHORT TERM GOAL #1   Title independent with initial HEP   Time 4   Period Weeks   Status On-going     PT SHORT TERM GOAL #2   Title perform AROM with 50% less pain  Time 4   Period Weeks   Status On-going           PT Long Term Goals - 11/09/16 1653      PT LONG TERM GOAL #1   Title return to lifting light weights as part of exericse routine at least 2x per week   Time 8   Period Weeks   Status On-going     PT LONG TERM GOAL #2   Title FOTO < or + 36% limitation   Baseline 47% limitation   Time 8   Period Weeks   Status On-going     PT LONG TERM GOAL #3   Title reports 75% decrease in pain throughout the day due to increased ability to maintain good posture at work.   Time 8   Period Weeks   Status On-going               Plan - 11/11/16 1637    Clinical Impression Statement Did not do any exercises in supine or prone per patient request, laying down increases back pain. Pt able to tolerate all strengthening exercsies well today. Will continue with strengthening exercises in standing for core strength and stability.    Rehab Potential Good   PT Frequency 2x / week   PT  Duration 8 weeks   PT Treatment/Interventions Passive range of motion;Neuromuscular re-education;Moist Heat;Manual techniques;Energy conservation;Patient/family education;Therapeutic activities;Therapeutic exercise;Cryotherapy;Traction;Electrical Stimulation   PT Next Visit Plan Postural strengthening and stretching in standing or sitting only   Consulted and Agree with Plan of Care Patient      Patient will benefit from skilled therapeutic intervention in order to improve the following deficits and impairments:  Pain, Increased muscle spasms, Decreased activity tolerance, Decreased endurance, Decreased range of motion, Decreased strength, Impaired UE functional use  Visit Diagnosis: Cervicalgia  Other muscle spasm     Problem List Patient Active Problem List   Diagnosis Date Noted  . Fibromyalgia 07/15/2015  . Multinodular goiter (nontoxic) 09/18/2014    [name] PTA 11/11/2016, 5:08 PM   [facility] [address], [place], [place], [address] Phone: [phone]   Fax:  [phone]  Name: Rhonda Bradley MRN: [id number] Date of Birth: [birth year]

## 2016-11-16 ENCOUNTER — Ambulatory Visit: Payer: 59 | Admitting: Physical Therapy

## 2016-11-16 ENCOUNTER — Encounter: Payer: Self-pay | Admitting: Physical Therapy

## 2016-11-16 DIAGNOSIS — M62838 Other muscle spasm: Secondary | ICD-10-CM

## 2016-11-16 DIAGNOSIS — M542 Cervicalgia: Secondary | ICD-10-CM | POA: Diagnosis not present

## 2016-11-16 NOTE — Therapy (Signed)
[facility] [address], [place], [place], [address] Phone: [phone]   Fax:  [phone]  Physical Therapy Treatment  Patient Details  Name: Rhonda Bradley MRN: [id number] Date of Birth: July 20, 1966 Referring Provider: Lajean Manes MD  Encounter Date: 11/16/2016      PT End of Session - 11/16/16 1623    Visit Number 6   Date for PT Re-Evaluation 12/20/16   PT Start Time 1616   PT Stop Time 1700   PT Time Calculation (min) 44 min   Activity Tolerance Patient tolerated treatment well   Behavior During Therapy [facility] for tasks assessed/performed      Past Medical History:  Diagnosis Date  . Anemia    as a child   . Anxiety   . Asthma   . Complication of anesthesia    slow to wake up age 50, but since cholecystectomy and no problems   . Depression   . Family history of anesthesia complication    father slow to wake up   . Fibromyalgia   . GERD (gastroesophageal reflux disease)   . Gestational diabetes   . Headache   . Hypertension     Past Surgical History:  Procedure Laterality Date  . CESAREAN SECTION     x 3  . CHOLECYSTECTOMY    . THYROIDECTOMY N/A 09/19/2014   Procedure: THYROIDECTOMY;  Surgeon: Armandina Gemma, MD;  Location: [facility];  Service: General;  Laterality: N/A;    There were no vitals filed for this visit.      Subjective Assessment - 11/16/16 1617    Subjective Pt reports tightness across upper back and down right arm. Some pain in in neck.    How long can you sit comfortably? unable to determine   How long can you stand comfortably? limited by fatigue, variable   How long can you walk comfortably? limited by fatigue, variable   Patient Stated Goals decreased pain   Currently in Pain? Yes                         [facility] Adult PT Treatment/Exercise - 11/16/16 0001      Shoulder Exercises: Seated   Other Seated Exercises Chest press  3x10 1 plate     Shoulder Exercises: Standing   External Rotation Strengthening;Both  #10 3x10   Internal Rotation Strengthening;Both  #15 3x10   Extension Strengthening;Both;20 reps  #20 3x10 Lt only   Row Strengthening;Both;20 reps  #20 3x10     Shoulder Exercises: Stretch   Internal Rotation Stretch 10 seconds   Other Shoulder Stretches Rhomboid stretch     Manual Therapy   Manual Therapy Soft tissue mobilization   Manual therapy comments prone   Soft tissue mobilization Bil upper traps rhomboids                  PT Short Term Goals - 11/16/16 1623      PT SHORT TERM GOAL #1   Title independent with initial HEP   Time 4   Period Weeks   Status Achieved     PT SHORT TERM GOAL #2   Title perform AROM with 50% less pain   Time 4   Period Weeks   Status On-going           PT Long Term Goals - 11/16/16 1623      PT LONG TERM GOAL #1   Title return to lifting light  weights as part of exericse routine at least 2x per week   Time 8   Period Weeks   Status On-going     PT LONG TERM GOAL #2   Title FOTO < or + 36% limitation   Baseline 47% limitation   Time 8   Period Weeks   Status On-going     PT LONG TERM GOAL #3   Title reports 75% decrease in pain throughout the day due to increased ability to maintain good posture at work.   Time 8   Period Weeks   Status On-going               Plan - 11/16/16 1702    Clinical Impression Statement Pt reports having some radiating pain in Rt arm. Some increase pain in Rt shoulder with row and chest press with elbow flexion and shoulder extension. Able to tolerate all exercises on Lt and modified on Rt. Pt had trigger point in Rt upper trap with mayofascial adhesions. Pt will continue to benefit from skilled therapy for Core strength and stability.    Rehab Potential Good   Clinical Impairments Affecting Rehab Potential fibromyalgia, fatigue   PT Frequency 2x / week   PT Duration 8 weeks   PT Treatment/Interventions  Passive range of motion;Neuromuscular re-education;Moist Heat;Manual techniques;Energy conservation;Patient/family education;Therapeutic activities;Therapeutic exercise;Cryotherapy;Traction;Electrical Stimulation   PT Next Visit Plan Lower and middle trap strengthening, upper trap trigger point release   Consulted and Agree with Plan of Care Patient      Patient will benefit from skilled therapeutic intervention in order to improve the following deficits and impairments:  Pain, Increased muscle spasms, Decreased activity tolerance, Decreased endurance, Decreased range of motion, Decreased strength, Impaired UE functional use  Visit Diagnosis: Cervicalgia  Other muscle spasm     Problem List Patient Active Problem List   Diagnosis Date Noted  . Fibromyalgia 07/15/2015  . Multinodular goiter (nontoxic) 09/18/2014    [name] PTA 11/16/2016, 5:05 PM  [facility] [address] [place], [place], [address] Phone: [phone]   Fax:  [phone]  Name: Rhonda Bradley MRN: [id number] Date of Birth: 09-15-1966

## 2016-11-18 ENCOUNTER — Ambulatory Visit: Payer: 59 | Admitting: Physical Therapy

## 2016-11-18 ENCOUNTER — Encounter: Payer: Self-pay | Admitting: Physical Therapy

## 2016-11-18 DIAGNOSIS — M542 Cervicalgia: Secondary | ICD-10-CM | POA: Diagnosis not present

## 2016-11-18 DIAGNOSIS — M62838 Other muscle spasm: Secondary | ICD-10-CM

## 2016-11-18 NOTE — Therapy (Signed)
[facility] [address] [place], [place], [address] Phone: [phone]   Fax:  [phone]  Physical Therapy Treatment  Patient Details  Name: Rhonda Bradley MRN: [id number] Date of Birth: 10-Jan-1966 Referring Provider: Lajean Manes MD  Encounter Date: 11/18/2016      PT End of Session - 11/18/16 1624    Visit Number 7   Date for PT Re-Evaluation 12/20/16   PT Start Time 1615   PT Stop Time 1659   PT Time Calculation (min) 44 min   Activity Tolerance Patient tolerated treatment well   Behavior During Therapy [facility] for tasks assessed/performed      Past Medical History:  Diagnosis Date  . Anemia    as a child   . Anxiety   . Asthma   . Complication of anesthesia    slow to wake up age 50, but since cholecystectomy and no problems   . Depression   . Family history of anesthesia complication    father slow to wake up   . Fibromyalgia   . GERD (gastroesophageal reflux disease)   . Gestational diabetes   . Headache   . Hypertension     Past Surgical History:  Procedure Laterality Date  . CESAREAN SECTION     x 3  . CHOLECYSTECTOMY    . THYROIDECTOMY N/A 09/19/2014   Procedure: THYROIDECTOMY;  Surgeon: Armandina Gemma, MD;  Location: [facility];  Service: General;  Laterality: N/A;    There were no vitals filed for this visit.      Subjective Assessment - 11/18/16 1616    Subjective Pt reports continued chronic shoulder and pain, some in lower back today.    How long can you sit comfortably? unable to determine   How long can you stand comfortably? limited by fatigue, variable   How long can you walk comfortably? limited by fatigue, variable   Patient Stated Goals decreased pain   Currently in Pain? Yes   Pain Score 4    Pain Location Neck   Pain Orientation Right;Left   Pain Descriptors / Indicators Aching;Throbbing   Pain Type Chronic pain   Pain Radiating Towards Rt arm   Pain Onset More  than a month ago   Pain Frequency Constant                         [facility] Adult PT Treatment/Exercise - 11/18/16 0001      Shoulder Exercises: Standing   Horizontal ABduction Strengthening;Right;Theraband;20 reps   Theraband Level (Shoulder Horizontal ABduction) Level 2 (Red)   External Rotation Strengthening;Right;20 reps;Theraband   Theraband Level (Shoulder External Rotation) Level 2 (Red)   Extension Strengthening;Right;20 reps;Theraband   Theraband Level (Shoulder Extension) Level 2 (Red)   Retraction Strengthening;Right;20 reps;Theraband   Other Standing Exercises Serratus wall slides with foam roll   Other Standing Exercises Wall slides     Modalities   Modalities Electrical Stimulation;Moist Heat     Moist Heat Therapy   Number Minutes Moist Heat 15 Minutes   Moist Heat Location Shoulder  Bil     Electrical Stimulation   Electrical Stimulation Location Bil upper traps   Electrical Stimulation Action IFC   Electrical Stimulation Parameters To tolerance 15 minutes   Electrical Stimulation Goals Pain     Manual Therapy   Manual Therapy Soft tissue mobilization   Manual therapy comments Seated   Soft tissue mobilization IASTM upper trap  mayofascial gliding  PT Short Term Goals - 11/16/16 1623      PT SHORT TERM GOAL #1   Title independent with initial HEP   Time 4   Period Weeks   Status Achieved     PT SHORT TERM GOAL #2   Title perform AROM with 50% less pain   Time 4   Period Weeks   Status On-going           PT Long Term Goals - 11/16/16 1623      PT LONG TERM GOAL #1   Title return to lifting light weights as part of exericse routine at least 2x per week   Time 8   Period Weeks   Status On-going     PT LONG TERM GOAL #2   Title FOTO < or + 36% limitation   Baseline 47% limitation   Time 8   Period Weeks   Status On-going     PT LONG TERM GOAL #3   Title reports 75% decrease in pain throughout  the day due to increased ability to maintain good posture at work.   Time 8   Period Weeks   Status On-going               Plan - 11/18/16 1624    Clinical Impression Statement Pt continues to hav echronic pain in Rt shoulder and neck occasionally radiating into low back and into jaw. Pt able to tolerate all exercsies well with some shoulder muscle fatigue. Pt will continue to benefit from skilled therapy for shoulder strengthening.    Rehab Potential Good   Clinical Impairments Affecting Rehab Potential fibromyalgia, fatigue   PT Frequency 2x / week   PT Duration 8 weeks   PT Treatment/Interventions Passive range of motion;Neuromuscular re-education;Moist Heat;Manual techniques;Energy conservation;Patient/family education;Therapeutic activities;Therapeutic exercise;Cryotherapy;Traction;Electrical Stimulation   PT Next Visit Plan Lower and middle trap strengthening, upper trap trigger point release   Consulted and Agree with Plan of Care Patient      Patient will benefit from skilled therapeutic intervention in order to improve the following deficits and impairments:  Pain, Increased muscle spasms, Decreased activity tolerance, Decreased endurance, Decreased range of motion, Decreased strength, Impaired UE functional use  Visit Diagnosis: Cervicalgia  Other muscle spasm     Problem List Patient Active Problem List   Diagnosis Date Noted  . Fibromyalgia 07/15/2015  . Multinodular goiter (nontoxic) 09/18/2014    Rhonda Bradley 11/18/2016, 4:46 PM  [facility] [address] [place], [place], [address] Phone: [phone]   Fax:  [phone]  Name: Rhonda Bradley MRN: [id number] Date of Birth: 01-10-1966

## 2016-11-23 ENCOUNTER — Ambulatory Visit: Payer: 59 | Admitting: Physical Therapy

## 2016-11-23 ENCOUNTER — Encounter: Payer: Self-pay | Admitting: Physical Therapy

## 2016-11-23 DIAGNOSIS — M542 Cervicalgia: Secondary | ICD-10-CM | POA: Diagnosis not present

## 2016-11-23 DIAGNOSIS — M62838 Other muscle spasm: Secondary | ICD-10-CM

## 2016-11-23 NOTE — Therapy (Signed)
[facility] [address], [place], [place], [address] Phone: [phone]   Fax:  [phone]  Physical Therapy Treatment  Patient Details  Name: Rhonda Bradley MRN: [id number] Date of Birth: 1966/10/08 Referring Provider: Lajean Manes MD  Encounter Date: 11/23/2016      PT End of Session - 11/23/16 1624    Visit Number 8   Date for PT Re-Evaluation 12/20/16   PT Start Time 1616   PT Stop Time 1655   PT Time Calculation (min) 39 min   Activity Tolerance Patient tolerated treatment well   Behavior During Therapy [facility] for tasks assessed/performed      Past Medical History:  Diagnosis Date  . Anemia    as a child   . Anxiety   . Asthma   . Complication of anesthesia    slow to wake up age 25, but since cholecystectomy and no problems   . Depression   . Family history of anesthesia complication    father slow to wake up   . Fibromyalgia   . GERD (gastroesophageal reflux disease)   . Gestational diabetes   . Headache   . Hypertension     Past Surgical History:  Procedure Laterality Date  . CESAREAN SECTION     x 3  . CHOLECYSTECTOMY    . THYROIDECTOMY N/A 09/19/2014   Procedure: THYROIDECTOMY;  Surgeon: Armandina Gemma, MD;  Location: [facility];  Service: General;  Laterality: N/A;    There were no vitals filed for this visit.      Subjective Assessment - 11/23/16 1618    Subjective Pt reports some soreness across neck and shoulders.    How long can you sit comfortably? unable to determine   How long can you stand comfortably? limited by fatigue, variable   How long can you walk comfortably? limited by fatigue, variable   Patient Stated Goals decreased pain   Currently in Pain? Yes   Pain Score 1    Pain Location Neck   Pain Orientation Right;Left   Pain Descriptors / Indicators Aching   Pain Type Chronic pain   Pain Onset More than a month ago   Pain Frequency Constant                          [facility] Adult PT Treatment/Exercise - 11/23/16 0001      Shoulder Exercises: Standing   Horizontal ABduction Strengthening;Right;Theraband;20 reps   Theraband Level (Shoulder Horizontal ABduction) Level 2 (Red)   External Rotation Strengthening;Right;20 reps;Theraband   Theraband Level (Shoulder External Rotation) Level 2 (Red)   Extension Strengthening;Right;20 reps;Theraband   Theraband Level (Shoulder Extension) Level 2 (Red)     Manual Therapy   Manual Therapy Soft tissue mobilization   Manual therapy comments Seated   Soft tissue mobilization Bil upper traps                  PT Short Term Goals - 11/23/16 1625      PT SHORT TERM GOAL #1   Title independent with initial HEP   Time 4   Period Weeks   Status Achieved     PT SHORT TERM GOAL #2   Title perform AROM with 50% less pain   Time 4   Period Weeks   Status On-going           PT Long Term Goals - 11/23/16 1658      PT LONG TERM  GOAL #1   Title return to lifting light weights as part of exericse routine at least 2x per week   Time 8   Period Weeks   Status On-going               Plan - 11/23/16 1658    Clinical Impression Statement Pt progressing well with postural stability and Bil UE strength. Pt reports less pain in neck. Some tightness in Bil upper traps that reduced with manual soft tissue mobilization. Will begin adding more functional activities at next visit. Pt will continue to benefit from skilled therapy for Bil UE and core strength and stability.    Rehab Potential Good   Clinical Impairments Affecting Rehab Potential fibromyalgia, fatigue   PT Frequency 2x / week   PT Duration 8 weeks   PT Treatment/Interventions Passive range of motion;Neuromuscular re-education;Moist Heat;Manual techniques;Energy conservation;Patient/family education;Therapeutic activities;Therapeutic exercise;Cryotherapy;Traction;Electrical Stimulation   PT Next Visit  Plan Functional activities (lifting, carrying), middle and lower trap strength   Consulted and Agree with Plan of Care Patient      Patient will benefit from skilled therapeutic intervention in order to improve the following deficits and impairments:  Pain, Increased muscle spasms, Decreased activity tolerance, Decreased endurance, Decreased range of motion, Decreased strength, Impaired UE functional use  Visit Diagnosis: Cervicalgia  Other muscle spasm     Problem List Patient Active Problem List   Diagnosis Date Noted  . Fibromyalgia 07/15/2015  . Multinodular goiter (nontoxic) 09/18/2014    [name] PTA 11/23/2016, 5:03 PM  [facility] [address] [place], [place], [address] Phone: [phone]   Fax:  [phone]  Name: Rhonda Bradley MRN: [id number] Date of Birth: 12-Sep-1966

## 2016-11-25 ENCOUNTER — Ambulatory Visit: Payer: 59 | Admitting: Physical Therapy

## 2016-11-25 ENCOUNTER — Encounter: Payer: Self-pay | Admitting: Physical Therapy

## 2016-11-25 DIAGNOSIS — M542 Cervicalgia: Secondary | ICD-10-CM

## 2016-11-25 DIAGNOSIS — M62838 Other muscle spasm: Secondary | ICD-10-CM

## 2016-11-25 NOTE — Therapy (Signed)
[facility] [address] [place], [place], [address] Phone: [phone]   Fax:  [phone]  Physical Therapy Treatment  Patient Details  Name: Rhonda Bradley MRN: [id number] Date of Birth: September 25, 1966 Referring Provider: Lajean Manes MD  Encounter Date: 11/25/2016      PT End of Session - 11/25/16 1620    Visit Number 9   Date for PT Re-Evaluation 12/20/16   PT Start Time 1616   PT Stop Time 1702   PT Time Calculation (min) 46 min   Activity Tolerance Patient tolerated treatment well   Behavior During Therapy [facility] for tasks assessed/performed      Past Medical History:  Diagnosis Date  . Anemia    as a child   . Anxiety   . Asthma   . Complication of anesthesia    slow to wake up age 63, but since cholecystectomy and no problems   . Depression   . Family history of anesthesia complication    father slow to wake up   . Fibromyalgia   . GERD (gastroesophageal reflux disease)   . Gestational diabetes   . Headache   . Hypertension     Past Surgical History:  Procedure Laterality Date  . CESAREAN SECTION     x 3  . CHOLECYSTECTOMY    . THYROIDECTOMY N/A 09/19/2014   Procedure: THYROIDECTOMY;  Surgeon: Armandina Gemma, MD;  Location: [facility];  Service: General;  Laterality: N/A;    There were no vitals filed for this visit.      Subjective Assessment - 11/25/16 1618    Subjective Pt reports yesterday being a aching day. Pain in Rt upper trap down to wrist and thumb. Feeling good today.   How long can you sit comfortably? unable to determine   How long can you stand comfortably? limited by fatigue, variable   How long can you walk comfortably? limited by fatigue, variable   Patient Stated Goals decreased pain   Currently in Pain? Yes   Pain Score 2    Pain Location Neck   Pain Orientation Right;Left   Pain Descriptors / Indicators Aching   Pain Type Chronic pain   Pain Radiating Towards Rt  arm   Pain Onset More than a month ago   Pain Frequency Constant                         [facility] Adult PT Treatment/Exercise - 11/25/16 0001      Neck Exercises: Machines for Strengthening   Other Machines for Strengthening Nustep with arms  L2 x 8 minutes     Shoulder Exercises: Standing   Horizontal ABduction Strengthening;Right;Theraband;20 reps   Theraband Level (Shoulder Horizontal ABduction) Level 2 (Red)   External Rotation Strengthening;Right;20 reps;Theraband  10x2 1 plate   Internal Rotation Strengthening;Both  #15 2x10   Extension Strengthening;Right;20 reps;Theraband  #30 15x2   Extension Weight (lbs) 30   Row Strengthening;Both;20 reps  #30 15x2   Other Standing Exercises D1 extension  1 plate   Other Standing Exercises Box carry  #20 3 laps     Shoulder Exercises: Stretch   Other Shoulder Stretches Wall stretch Bill shoulders     Manual Therapy   Manual Therapy Soft tissue mobilization   Manual therapy comments Seated   Soft tissue mobilization Bil upper traps                PT Education - 11/25/16 1633  Education provided Yes   Education Details Shoulder strengthening   Person(s) Educated Patient   Methods Explanation;Demonstration;Handout   Comprehension Verbalized understanding          PT Short Term Goals - 11/25/16 1623      PT SHORT TERM GOAL #1   Title independent with initial HEP   Time 4   Period Weeks   Status Achieved     PT SHORT TERM GOAL #2   Title perform AROM with 50% less pain   Baseline 75%   Time 4   Period Weeks   Status Achieved           PT Long Term Goals - 11/25/16 1624      PT LONG TERM GOAL #1   Title return to lifting light weights as part of exericse routine at least 2x per week   Time 8   Period Weeks   Status On-going     PT LONG TERM GOAL #2   Title FOTO < or + 36% limitation   Baseline 47% limitation   Time 8   Period Weeks   Status On-going     PT LONG TERM GOAL  #3   Title reports 75% decrease in pain throughout the day due to increased ability to maintain good posture at work.   Time 8   Period Weeks   Status Achieved               Plan - 11/25/16 1705    Clinical Impression Statement Pt continues to have multiple trigger points in Bil upper traps. Progressing well with all strengthening and stability exercises. Pt did well with box carry needing cues for good form, keeping elbows in and bracing core. Pt will continue to progress from skilled therapy for core strength and neck stability.   Rehab Potential Good   Clinical Impairments Affecting Rehab Potential fibromyalgia, fatigue   PT Frequency 2x / week   PT Duration 8 weeks   PT Treatment/Interventions Passive range of motion;Neuromuscular re-education;Moist Heat;Manual techniques;Energy conservation;Patient/family education;Therapeutic activities;Therapeutic exercise;Cryotherapy;Traction;Electrical Stimulation   PT Next Visit Plan Functional activities (lifting, carrying), middle and lower trap strength   PT Home Exercise Plan shoulder/ back strength   Consulted and Agree with Plan of Care Patient      Patient will benefit from skilled therapeutic intervention in order to improve the following deficits and impairments:  Pain, Increased muscle spasms, Decreased activity tolerance, Decreased endurance, Decreased range of motion, Decreased strength, Impaired UE functional use  Visit Diagnosis: Cervicalgia  Other muscle spasm     Problem List Patient Active Problem List   Diagnosis Date Noted  . Fibromyalgia 07/15/2015  . Multinodular goiter (nontoxic) 09/18/2014    Rhonda Bradley PTA 11/25/2016, 5:09 PM   [facility] [address] [place], [place], [address] Phone: [phone]   Fax:  [phone]  Name: Rhonda Bradley MRN: [id number] Date of Birth: Jul 21, 1966

## 2016-11-25 NOTE — Patient Instructions (Signed)
Resistive Band Rowing    With resistive band anchored in door, grasp both ends. Keeping elbows bent, pull back, squeezing shoulder blades together.  Repeat __15__ times. Do __2__ sets per session.  http://gt2.exer.us/98   Copyright  VHI. All rights reserved.  (Clinic) Extension / Flexion (Assist)    Face pulley, right arm as far forward and up as is pain free. Pull arm down toward side. Repeat __15__ times per set. Do __2__ sets per session. Do __3__ sessions per week.   Copyright  VHI. All rights reserved.   (Clinic) PNF: D1 Extension - Unilateral    Opposite side toward pulley, right arm up, across body, thumb up, pull arm down across body, rotating to thumb down. Follow hand with head and eyes. Repeat __10__ times per set. Do __2__ sets per session. Do __3__ sessions per week.  Copyright  VHI. All rights reserved.  [name], PTA 11/25/16 4:30 PM  [facility] [address], [place], [place] [address] Phone # [phone] Fax [phone]

## 2016-11-30 ENCOUNTER — Encounter: Payer: 59 | Admitting: Physical Therapy

## 2016-12-02 ENCOUNTER — Ambulatory Visit: Payer: 59 | Admitting: Physical Therapy

## 2016-12-02 DIAGNOSIS — M542 Cervicalgia: Secondary | ICD-10-CM

## 2016-12-02 DIAGNOSIS — M62838 Other muscle spasm: Secondary | ICD-10-CM

## 2016-12-02 NOTE — Therapy (Signed)
[facility] [address] [place], [place], [address] Phone: [phone]   Fax:  [phone]  Physical Therapy Treatment  Patient Details  Name: Rhonda Bradley MRN: [id number] Date of Birth: 27-Dec-1965 Referring Provider: Lajean Manes MD  Encounter Date: 12/02/2016      PT End of Session - 12/02/16 1658    Visit Number 10   Date for PT Re-Evaluation 12/20/16   PT Start Time 1615   PT Stop Time 1708   PT Time Calculation (min) 53 min   Activity Tolerance Patient tolerated treatment well      Past Medical History:  Diagnosis Date  . Anemia    as a child   . Anxiety   . Asthma   . Complication of anesthesia    slow to wake up age 26, but since cholecystectomy and no problems   . Depression   . Family history of anesthesia complication    father slow to wake up   . Fibromyalgia   . GERD (gastroesophageal reflux disease)   . Gestational diabetes   . Headache   . Hypertension     Past Surgical History:  Procedure Laterality Date  . CESAREAN SECTION     x 3  . CHOLECYSTECTOMY    . THYROIDECTOMY N/A 09/19/2014   Procedure: THYROIDECTOMY;  Surgeon: Armandina Gemma, MD;  Location: [facility];  Service: General;  Laterality: N/A;    There were no vitals filed for this visit.      Subjective Assessment - 12/02/16 1618    Subjective Having right much pain right neck, upper trap, scapula, upper arm to wrist and low back pain constantly.  I am still better than when I started therapy.  Manual therapy helps.  Exercise helps too.  Lifting box last time might have bothered my back.     Currently in Pain? Yes   Pain Score 5    Pain Location Neck   Pain Orientation Right   Pain Type Chronic pain                         [facility] Adult PT Treatment/Exercise - 12/02/16 0001      Neck Exercises: Seated   Other Seated Exercise review of HEP to optimize effects of dry needling:  upper trap stretch,  scapular retractions, cervical retractions in sitting and supine     Moist Heat Therapy   Number Minutes Moist Heat 15 Minutes   Moist Heat Location Shoulder;Cervical     Electrical Stimulation   Electrical Stimulation Location bilateral cervical, right medial scapula and right upper trap   Electrical Stimulation Action IFC   Electrical Stimulation Parameters seated 9 ma 15 min   Electrical Stimulation Goals Pain     Manual Therapy   Soft tissue mobilization bilateral cervical paraspinals, suboccipitals, rhomboids, upper traps          Trigger Point Dry Needling - 12/02/16 1702    Consent Given? Yes   Education Handout Provided Yes   Muscles Treated Upper Body Upper trapezius;Suboccipitals muscle group;Rhomboids  bilateral cervical multfidi   Upper Trapezius Response Twitch reponse elicited;Palpable increased muscle length   SubOccipitals Response Twitch response elicited;Palpable increased muscle length   Rhomboids Response Palpable increased muscle length              PT Education - 12/02/16 1658    Education provided Yes   Education Details dry needling after care   Person(s) Educated  Patient   Methods Explanation;Demonstration;Handout   Comprehension Verbalized understanding;Returned demonstration          PT Short Term Goals - 12/02/16 1703      PT SHORT TERM GOAL #1   Title independent with initial HEP   Status Achieved     PT SHORT TERM GOAL #2   Title perform AROM with 50% less pain   Status Achieved           PT Long Term Goals - 12/02/16 1703      PT LONG TERM GOAL #1   Title return to lifting light weights as part of exericse routine at least 2x per week   Time 8   Period Weeks   Status On-going     PT LONG TERM GOAL #2   Title FOTO < or + 36% limitation   Time 8   Period Weeks   Status On-going     PT LONG TERM GOAL #3   Title reports 75% decrease in pain throughout the day due to increased ability to maintain good posture at  work.   Status Achieved               Plan - 12/02/16 1700    Clinical Impression Statement The patient reports some improvement with PT but continues to have right > left neck and radiating pain into right UE.  She has multiple tender points in right upper traps, rhomboids and suboccipitals.  Improved muscle length following treatment session.  Therapist closely monitoring response with all treatment interventions.     PT Next Visit Plan assess response to dry needling #1; manual therapy;  postural strengthening;  modalities as needed for pain control      Patient will benefit from skilled therapeutic intervention in order to improve the following deficits and impairments:     Visit Diagnosis: Cervicalgia  Other muscle spasm     Problem List Patient Active Problem List   Diagnosis Date Noted  . Fibromyalgia 07/15/2015  . Multinodular goiter (nontoxic) 09/18/2014    Ruben Im, PT 12/02/16 5:08 PM Phone: [phone] Fax: [phone]  Alvera Singh 12/02/2016, 5:04 PM  [facility] [address], [place], [place], [address] Phone: [phone]   Fax:  [phone]  Name: Rhonda Bradley MRN: [id number] Date of Birth: 12/04/1966

## 2016-12-02 NOTE — Patient Instructions (Signed)
     Trigger Point Dry Needling  . What is Trigger Point Dry Needling (DN)? o DN is a physical therapy technique used to treat muscle pain and dysfunction. Specifically, DN helps deactivate muscle trigger points (muscle knots).  o A thin filiform needle is used to penetrate the skin and stimulate the underlying trigger point. The goal is for a local twitch response (LTR) to occur and for the trigger point to relax. No medication of any kind is injected during the procedure.   . What Does Trigger Point Dry Needling Feel Like?  o The procedure feels different for each individual patient. Some patients report that they do not actually feel the needle enter the skin and overall the process is not painful. Very mild bleeding may occur. However, many patients feel a deep cramping in the muscle in which the needle was inserted. This is the local twitch response.   . How Will I feel after the treatment? o Soreness is normal, and the onset of soreness may not occur for a few hours. Typically this soreness does not last longer than two days.  o Bruising is uncommon, however; ice can be used to decrease any possible bruising.  o In rare cases feeling tired or nauseous after the treatment is normal. In addition, your symptoms may get worse before they get better, this period will typically not last longer than 24 hours.   . What Can I do After My Treatment? o Increase your hydration by drinking more water for the next 24 hours. o You may place ice or heat on the areas treated that have become sore, however, do not use heat on inflamed or bruised areas. Heat often brings more relief post needling. o You can continue your regular activities, but vigorous activity is not recommended initially after the treatment for 24 hours. o DN is best combined with other physical therapy such as strengthening, stretching, and other therapies.      PT [facility] [address], [address] [place], [place] [address] Phone # [phone] Fax [phone] 

## 2016-12-09 ENCOUNTER — Ambulatory Visit: Payer: 59 | Attending: Geriatric Medicine | Admitting: Physical Therapy

## 2016-12-09 DIAGNOSIS — M542 Cervicalgia: Secondary | ICD-10-CM

## 2016-12-09 DIAGNOSIS — M62838 Other muscle spasm: Secondary | ICD-10-CM

## 2016-12-09 NOTE — Therapy (Signed)
[facility] [address] [place], [place], [address] Phone: [phone]   Fax:  [phone]  Physical Therapy Treatment  Patient Details  Name: Rhonda Bradley MRN: [id number] Date of Birth: Sep 03, 1966 Referring Provider: Lajean Manes MD  Encounter Date: 12/09/2016      PT End of Session - 12/09/16 1714    Visit Number 11   Date for PT Re-Evaluation 12/20/16   PT Start Time [id number]   PT Stop Time 1623   PT Time Calculation (min) 50 min   Activity Tolerance Patient tolerated treatment well      Past Medical History:  Diagnosis Date  . Anemia    as a child   . Anxiety   . Asthma   . Complication of anesthesia    slow to wake up age 51, but since cholecystectomy and no problems   . Depression   . Family history of anesthesia complication    father slow to wake up   . Fibromyalgia   . GERD (gastroesophageal reflux disease)   . Gestational diabetes   . Headache   . Hypertension     Past Surgical History:  Procedure Laterality Date  . CESAREAN SECTION     x 3  . CHOLECYSTECTOMY    . THYROIDECTOMY N/A 09/19/2014   Procedure: THYROIDECTOMY;  Surgeon: Armandina Gemma, MD;  Location: [facility];  Service: General;  Laterality: N/A;    There were no vitals filed for this visit.      Subjective Assessment - 12/09/16 1538    Subjective States she had flare up following last treatment.  Saw no benefit from dry needling.   Left upper neck, tender points around shoulder blades.  Left jaw line.   Currently in Pain? Yes   Pain Score 4    Pain Location Neck   Pain Orientation Left   Pain Type Chronic pain                         [facility] Adult PT Treatment/Exercise - 12/09/16 0001      Shoulder Exercises: Standing   Extension Strengthening;Right;20 reps;Theraband  #25 10x2   Row Strengthening;Both;20 reps  #25 10x2   Other Standing Exercises D1 extension  20# 2x10 each arm     Moist Heat  Therapy   Number Minutes Moist Heat 15 Minutes   Moist Heat Location Shoulder;Cervical     Electrical Stimulation   Electrical Stimulation Location bilateral cervical, right medial scapula and right upper trap   Electrical Stimulation Action IFC   Electrical Stimulation Parameters seated 9 ma 15 min   Electrical Stimulation Goals Pain     Manual Therapy   Manual Therapy Myofascial release;Manual Traction 5x 20 sec holds   Soft tissue mobilization upper traps, suboccipitals   Myofascial Release suboccipital release 3 min                  PT Short Term Goals - 12/09/16 1721      PT SHORT TERM GOAL #1   Title independent with initial HEP   Status Achieved     PT SHORT TERM GOAL #2   Title perform AROM with 50% less pain   Status Achieved           PT Long Term Goals - 12/09/16 1721      PT LONG TERM GOAL #1   Title return to lifting light weights as part of exericse routine at least  2x per week   Time 8   Period Weeks   Status On-going     PT LONG TERM GOAL #2   Title FOTO < or + 36% limitation   Time 8   Period Weeks   Status On-going     PT LONG TERM GOAL #3   Title reports 75% decrease in pain throughout the day due to increased ability to maintain good posture at work.   Status Achieved               Plan - 12/09/16 1715    Clinical Impression Statement The patient reports no benefit from dry needling and continues to have residual soreness 1 week later.  She continues to have multiple tender points but feels manual therapy helps temporarily.  No increase in pain with postural strengthening with light resistance and low reps.  Therapist closely monitoring response with all interventions.     PT Next Visit Plan discontinue dry needling;  manual therapy;  assess progress toward goals to determine readiness for discharge vs. continuation;  e-stim/heat as needed      Patient will benefit from skilled therapeutic intervention in order to improve  the following deficits and impairments:     Visit Diagnosis: Cervicalgia  Other muscle spasm     Problem List Patient Active Problem List   Diagnosis Date Noted  . Fibromyalgia 07/15/2015  . Multinodular goiter (nontoxic) 09/18/2014   Ruben Im, PT 12/09/16 5:23 PM Phone: [phone] Fax: [phone] Alvera Singh 12/09/2016, Lajuan Lines PM  [facility] [address] [place], [place], [address] Phone: [phone]   Fax:  [phone]  Name: Rhonda Bradley MRN: [id number] Date of Birth: [birth year]

## 2016-12-13 ENCOUNTER — Encounter: Payer: Self-pay | Admitting: Physical Therapy

## 2016-12-13 ENCOUNTER — Ambulatory Visit: Payer: 59 | Admitting: Physical Therapy

## 2016-12-13 DIAGNOSIS — M542 Cervicalgia: Secondary | ICD-10-CM | POA: Diagnosis not present

## 2016-12-13 DIAGNOSIS — M62838 Other muscle spasm: Secondary | ICD-10-CM

## 2016-12-13 NOTE — Patient Instructions (Signed)
Neck: Retraction    Sit with back straight or lie on firm surface. Place hands, with fingers locked, behind head. Keep head in neutral position. Push back with head while resisting with hands. Do not let head actually move. Hold _3___ seconds. Repeat __10__ times. Do __1__ sessions per day. CAUTION: Pressure should be steady.  Copyright  VHI. All rights reserved.    Neck Flexors    Sit with back straight. Tilt head slightly forward while resisting with fingertips. Do not let head move. Keep chin tucked. Do not allow trunk to lean forward. Hold __3__ seconds. Repeat __10__ times. Do __1__ sessions per day. CAUTION: Use steady pressure with fingertips only.  Copyright  VHI. All rights reserved.  NECK TENSION: Extension Stretch    Bring both hands to shoulders by sides of neck, fingers facing back. Inhaling, pull shoulders forward, and look up. Exhaling, bring head back to center. Repeat _2__ times. Do _as needed__ times per day.  Copyright  VHI. All rights reserved.   [name], PTA 12/13/16 4:55 PM  [facility] [address] [place], [place] [address] Phone # [phone] Fax [phone]

## 2016-12-13 NOTE — Therapy (Signed)
[facility] [address] [place], [place], [address] Phone: [phone]   Fax:  [phone]  Physical Therapy Treatment  Patient Details  Name: Rhonda Bradley MRN: [id number] Date of Birth: 1966-08-15 Referring Provider: Lajean Manes MD  Encounter Date: 12/13/2016      PT End of Session - 12/13/16 1626    Visit Number 12   Date for PT Re-Evaluation 12/20/16   PT Start Time 1621   PT Stop Time 1700   PT Time Calculation (min) 39 min   Activity Tolerance Patient tolerated treatment well   Behavior During Therapy [facility] for tasks assessed/performed      Past Medical History:  Diagnosis Date  . Anemia    as a child   . Anxiety   . Asthma   . Complication of anesthesia    slow to wake up age 48, but since cholecystectomy and no problems   . Depression   . Family history of anesthesia complication    father slow to wake up   . Fibromyalgia   . GERD (gastroesophageal reflux disease)   . Gestational diabetes   . Headache   . Hypertension     Past Surgical History:  Procedure Laterality Date  . CESAREAN SECTION     x 3  . CHOLECYSTECTOMY    . THYROIDECTOMY N/A 09/19/2014   Procedure: THYROIDECTOMY;  Surgeon: Armandina Gemma, MD;  Location: [facility];  Service: General;  Laterality: N/A;    There were no vitals filed for this visit.      Subjective Assessment - 12/13/16 1625    Subjective Still some soreness    How long can you sit comfortably? unable to determine   How long can you stand comfortably? limited by fatigue, variable   How long can you walk comfortably? limited by fatigue, variable   Patient Stated Goals decreased pain   Currently in Pain? Yes   Pain Score 6    Pain Location Neck   Pain Orientation Left   Pain Descriptors / Indicators Aching   Pain Type Chronic pain   Pain Onset More than a month ago   Pain Frequency Constant                         [facility] Adult PT  Treatment/Exercise - 12/13/16 0001      Neck Exercises: Standing   Neck Retraction 10 reps     Neck Exercises: Seated   Cervical Isometrics Flexion;Extension;Right lateral flexion;Left lateral flexion;3 secs;10 reps     Shoulder Exercises: Standing   Extension Strengthening;Right;20 reps;Theraband  #25 10x2   Row Strengthening;Both;20 reps  #25 10x2   Other Standing Exercises D1 extension  20# 2x10 each arm     Shoulder Exercises: Stretch   Other Shoulder Stretches neck flexor stretches     Moist Heat Therapy   Number Minutes Moist Heat 15 Minutes   Moist Heat Location Shoulder;Cervical     Electrical Stimulation   Electrical Stimulation Location bilateral cervical, right medial scapula and right upper trap   Electrical Stimulation Action IFC   Electrical Stimulation Parameters Seated to tolerance   Electrical Stimulation Goals Pain                PT Education - 12/13/16 1656    Education provided Yes   Education Details cervical isometrics   Person(s) Educated Patient   Methods Explanation;Demonstration;Handout   Comprehension Verbalized understanding  PT Short Term Goals - 12/09/16 1721      PT SHORT TERM GOAL #1   Title independent with initial HEP   Status Achieved     PT SHORT TERM GOAL #2   Title perform AROM with 50% less pain   Status Achieved           PT Long Term Goals - 12/13/16 1626      PT LONG TERM GOAL #1   Title return to lifting light weights as part of exericse routine at least 2x per week   Time 8   Period Weeks   Status On-going     PT LONG TERM GOAL #2   Title FOTO < or + 36% limitation   Baseline 47% limitation   Time 8   Period Weeks   Status On-going               Plan - 12/13/16 1658    Clinical Impression Statement Pt reports some residual soreness from dry needling. Able to tolerate all exercises and stretches well. Heat and estim to help lessen pain and tension. Pt will continue to benefit  from skilled therapy for cervical strength and stability,    Rehab Potential Good   Clinical Impairments Affecting Rehab Potential fibromyalgia, fatigue   PT Frequency 2x / week   PT Duration 8 weeks   PT Treatment/Interventions Passive range of motion;Neuromuscular re-education;Moist Heat;Manual techniques;Energy conservation;Patient/family education;Therapeutic activities;Therapeutic exercise;Cryotherapy;Traction;Electrical Stimulation   PT Next Visit Plan Cervical stabilization   PT Home Exercise Plan shoulder/ back strength   Consulted and Agree with Plan of Care Patient      Patient will benefit from skilled therapeutic intervention in order to improve the following deficits and impairments:  Pain, Increased muscle spasms, Decreased activity tolerance, Decreased endurance, Decreased range of motion, Decreased strength, Impaired UE functional use  Visit Diagnosis: Cervicalgia  Other muscle spasm     Problem List Patient Active Problem List   Diagnosis Date Noted  . Fibromyalgia 07/15/2015  . Multinodular goiter (nontoxic) 09/18/2014    [name] PTA 12/13/2016, 5:06 PM  [facility] [address], [place], [place], [address] Phone: [phone]   Fax:  [phone]  Name: Rhonda Bradley MRN: [id number] Date of Birth: [birth year]

## 2016-12-15 ENCOUNTER — Ambulatory Visit: Payer: 59

## 2016-12-15 DIAGNOSIS — M542 Cervicalgia: Secondary | ICD-10-CM

## 2016-12-15 DIAGNOSIS — M62838 Other muscle spasm: Secondary | ICD-10-CM

## 2016-12-15 NOTE — Therapy (Addendum)
[facility] [address] [place], [place], [address] Phone: [phone]   Fax:  [phone]  Physical Therapy Treatment  Patient Details  Name: Rhonda Bradley MRN: [id number] Date of Birth: Jan 04, 1966 Referring Provider: Lajean Manes MD  Encounter Date: 12/15/2016  Visit 20 1615 in  8280 out Recert: 0/34/91 Pt tolerated treatment well WFL behavior   Past Medical History:  Diagnosis Date  . Anemia    as a child   . Anxiety   . Asthma   . Complication of anesthesia    slow to wake up age 88, but since cholecystectomy and no problems   . Depression   . Family history of anesthesia complication    father slow to wake up   . Fibromyalgia   . GERD (gastroesophageal reflux disease)   . Gestational diabetes   . Headache   . Hypertension     Past Surgical History:  Procedure Laterality Date  . CESAREAN SECTION     x 3  . CHOLECYSTECTOMY    . THYROIDECTOMY N/A 09/19/2014   Procedure: THYROIDECTOMY;  Surgeon: Armandina Gemma, MD;  Location: [facility];  Service: General;  Laterality: N/A;    There were no vitals filed for this visit.      Subjective Assessment - 12/15/16 1620    Subjective Pt reports that "hands on" therapy is the only thing that helps me.  Unable to determine if PT is helping.     Currently in Pain? Yes   Pain Score 5    Pain Location Neck   Pain Orientation Left;Right   Pain Descriptors / Indicators Aching   Pain Type Chronic pain                         [facility] Adult PT Treatment/Exercise - 12/15/16 0001      Moist Heat Therapy   Number Minutes Moist Heat 15 Minutes   Moist Heat Location Cervical     Electrical Stimulation   Electrical Stimulation Location bil neck   Electrical Stimulation Action IFC   Electrical Stimulation Parameters seated.  15 minutes   Electrical Stimulation Goals Pain     Manual Therapy   Manual Therapy Myofascial release;Manual  Traction   Soft tissue mobilization upper traps, suboccipitals   Myofascial Release in sitting to bil. upper trap and neck.                   PT Short Term Goals - 12/09/16 1721      PT SHORT TERM GOAL #1   Title independent with initial HEP   Status Achieved     PT SHORT TERM GOAL #2   Title perform AROM with 50% less pain   Status Achieved           PT Long Term Goals - 12/13/16 1626      PT LONG TERM GOAL #1   Title return to lifting light weights as part of exericse routine at least 2x per week   Time 8   Period Weeks   Status On-going     PT LONG TERM GOAL #2   Title FOTO < or + 36% limitation   Baseline 47% limitation   Time 8   Period Weeks   Status On-going               Plan - 12/15/16 1648    Clinical Impression Statement Pt requested manual therapy and modalities only  today as she feels like exercise might be aggravating.  Pt with active trigger points in Rt and Lt upper traps.  Pt will be reassessed for continuation of PT next session.     Rehab Potential Good   Clinical Impairments Affecting Rehab Potential fibromyalgia, fatigue   PT Frequency 2x / week   PT Duration 8 weeks   PT Treatment/Interventions Passive range of motion;Neuromuscular re-education;Moist Heat;Manual techniques;Energy conservation;Patient/family education;Therapeutic activities;Therapeutic exercise;Cryotherapy;Traction;Electrical Stimulation   PT Next Visit Plan reassessment next session   Consulted and Agree with Plan of Care Patient      Patient will benefit from skilled therapeutic intervention in order to improve the following deficits and impairments:  Pain, Increased muscle spasms, Decreased activity tolerance, Decreased endurance, Decreased range of motion, Decreased strength, Impaired UE functional use  Visit Diagnosis: Cervicalgia  Other muscle spasm     Problem List Patient Active Problem List   Diagnosis Date Noted  . Fibromyalgia 07/15/2015   . Multinodular goiter (nontoxic) 09/18/2014    [name], PT 12/15/16 4:52 PM PHYSICAL THERAPY DISCHARGE SUMMARY  Visits from Start of Care: 13  Current functional level related to goals / functional outcomes: Pt had to cancel her final PT session due to illness.  Pt has HEP in place and responded well to manual therapy in the clinic.  She was encouraged to pursue regular massage if able.    Remaining deficits: See above for current status.     Education / Equipment: HEP, posture/body mechanics Plan: Patient agrees to discharge.  Patient goals were partially met. Patient is being discharged due to being pleased with the current functional level.  ?????        [name], PT 12/20/16 11:52 AM   [facility] [address] [place], [place], [address] Phone: [phone]   Fax:  [phone]  Name: Rhonda Bradley MRN: [id number] Date of Birth: Jan 02, 1966

## 2016-12-20 ENCOUNTER — Ambulatory Visit: Payer: 59

## 2016-12-20 DIAGNOSIS — I10 Essential (primary) hypertension: Secondary | ICD-10-CM | POA: Diagnosis not present

## 2016-12-20 DIAGNOSIS — R072 Precordial pain: Secondary | ICD-10-CM | POA: Diagnosis not present

## 2017-01-04 ENCOUNTER — Other Ambulatory Visit (HOSPITAL_COMMUNITY)
Admission: RE | Admit: 2017-01-04 | Discharge: 2017-01-04 | Disposition: A | Discharge status: Home / Self Care | Payer: 59 | Source: Ambulatory Visit | Attending: Nurse Practitioner | Admitting: Nurse Practitioner

## 2017-01-04 ENCOUNTER — Other Ambulatory Visit: Payer: Self-pay | Admitting: Nurse Practitioner

## 2017-01-04 DIAGNOSIS — Z113 Encounter for screening for infections with a predominantly sexual mode of transmission: Secondary | ICD-10-CM | POA: Insufficient documentation

## 2017-01-04 DIAGNOSIS — R140 Abdominal distension (gaseous): Secondary | ICD-10-CM | POA: Diagnosis not present

## 2017-01-04 DIAGNOSIS — R300 Dysuria: Secondary | ICD-10-CM | POA: Diagnosis not present

## 2017-01-04 DIAGNOSIS — Z01419 Encounter for gynecological examination (general) (routine) without abnormal findings: Secondary | ICD-10-CM | POA: Insufficient documentation

## 2017-01-04 DIAGNOSIS — Z1151 Encounter for screening for human papillomavirus (HPV): Secondary | ICD-10-CM | POA: Diagnosis present

## 2017-01-05 LAB — CERVICOVAGINAL ANCILLARY ONLY
Bacterial vaginitis: NEGATIVE
Candida vaginitis: POSITIVE — AB
Diagnosis: NEGATIVE
HPV: NOT DETECTED
Trichomonas: NEGATIVE

## 2017-01-06 DIAGNOSIS — E119 Type 2 diabetes mellitus without complications: Secondary | ICD-10-CM | POA: Diagnosis not present

## 2017-01-21 DIAGNOSIS — I10 Essential (primary) hypertension: Secondary | ICD-10-CM | POA: Diagnosis not present

## 2017-01-21 DIAGNOSIS — E119 Type 2 diabetes mellitus without complications: Secondary | ICD-10-CM | POA: Diagnosis not present

## 2017-02-11 DIAGNOSIS — H04123 Dry eye syndrome of bilateral lacrimal glands: Secondary | ICD-10-CM | POA: Diagnosis not present

## 2017-02-11 DIAGNOSIS — E890 Postprocedural hypothyroidism: Secondary | ICD-10-CM | POA: Diagnosis not present

## 2017-02-11 DIAGNOSIS — R682 Dry mouth, unspecified: Secondary | ICD-10-CM | POA: Diagnosis not present

## 2017-02-11 DIAGNOSIS — Z79899 Other long term (current) drug therapy: Secondary | ICD-10-CM | POA: Diagnosis not present

## 2017-02-22 DIAGNOSIS — L0293 Carbuncle, unspecified: Secondary | ICD-10-CM | POA: Diagnosis not present

## 2017-02-28 DIAGNOSIS — Z13820 Encounter for screening for osteoporosis: Secondary | ICD-10-CM | POA: Diagnosis not present

## 2017-03-25 DIAGNOSIS — J329 Chronic sinusitis, unspecified: Secondary | ICD-10-CM | POA: Diagnosis not present

## 2017-03-25 DIAGNOSIS — R05 Cough: Secondary | ICD-10-CM | POA: Diagnosis not present

## 2017-03-29 ENCOUNTER — Other Ambulatory Visit: Payer: Self-pay | Admitting: Internal Medicine

## 2017-03-29 ENCOUNTER — Ambulatory Visit
Admission: RE | Admit: 2017-03-29 | Discharge: 2017-03-29 | Disposition: A | Discharge status: Home / Self Care | Payer: 59 | Source: Ambulatory Visit | Attending: Internal Medicine | Admitting: Internal Medicine

## 2017-03-29 DIAGNOSIS — R05 Cough: Secondary | ICD-10-CM

## 2017-03-29 DIAGNOSIS — J40 Bronchitis, not specified as acute or chronic: Secondary | ICD-10-CM | POA: Diagnosis not present

## 2017-03-29 DIAGNOSIS — S82832A Other fracture of upper and lower end of left fibula, initial encounter for closed fracture: Secondary | ICD-10-CM | POA: Diagnosis not present

## 2017-03-29 DIAGNOSIS — J4541 Moderate persistent asthma with (acute) exacerbation: Secondary | ICD-10-CM | POA: Diagnosis not present

## 2017-03-29 DIAGNOSIS — R059 Cough, unspecified: Secondary | ICD-10-CM

## 2017-03-29 DIAGNOSIS — S99912A Unspecified injury of left ankle, initial encounter: Secondary | ICD-10-CM

## 2017-03-29 DIAGNOSIS — S93492A Sprain of other ligament of left ankle, initial encounter: Secondary | ICD-10-CM | POA: Diagnosis not present

## 2017-03-29 DIAGNOSIS — R079 Chest pain, unspecified: Secondary | ICD-10-CM | POA: Diagnosis not present

## 2017-04-04 DIAGNOSIS — K219 Gastro-esophageal reflux disease without esophagitis: Secondary | ICD-10-CM | POA: Diagnosis not present

## 2017-04-04 DIAGNOSIS — Z79899 Other long term (current) drug therapy: Secondary | ICD-10-CM | POA: Diagnosis not present

## 2017-04-04 DIAGNOSIS — E119 Type 2 diabetes mellitus without complications: Secondary | ICD-10-CM | POA: Diagnosis not present

## 2017-04-04 DIAGNOSIS — M810 Age-related osteoporosis without current pathological fracture: Secondary | ICD-10-CM | POA: Diagnosis not present

## 2017-04-04 DIAGNOSIS — Z0000 Encounter for general adult medical examination without abnormal findings: Secondary | ICD-10-CM | POA: Diagnosis not present

## 2017-04-04 DIAGNOSIS — E7800 Pure hypercholesterolemia, unspecified: Secondary | ICD-10-CM | POA: Diagnosis not present

## 2017-04-04 DIAGNOSIS — M797 Fibromyalgia: Secondary | ICD-10-CM | POA: Diagnosis not present

## 2017-05-09 DIAGNOSIS — Z79899 Other long term (current) drug therapy: Secondary | ICD-10-CM | POA: Diagnosis not present

## 2017-05-23 DIAGNOSIS — N390 Urinary tract infection, site not specified: Secondary | ICD-10-CM | POA: Diagnosis not present

## 2017-05-30 DIAGNOSIS — I10 Essential (primary) hypertension: Secondary | ICD-10-CM | POA: Diagnosis not present

## 2017-07-01 DIAGNOSIS — Z23 Encounter for immunization: Secondary | ICD-10-CM | POA: Diagnosis not present

## 2017-08-01 ENCOUNTER — Other Ambulatory Visit: Payer: Self-pay | Admitting: Geriatric Medicine

## 2017-08-01 DIAGNOSIS — Z1231 Encounter for screening mammogram for malignant neoplasm of breast: Secondary | ICD-10-CM

## 2017-08-03 DIAGNOSIS — M25512 Pain in left shoulder: Secondary | ICD-10-CM | POA: Diagnosis not present

## 2017-08-03 DIAGNOSIS — M797 Fibromyalgia: Secondary | ICD-10-CM | POA: Diagnosis not present

## 2017-08-03 DIAGNOSIS — E119 Type 2 diabetes mellitus without complications: Secondary | ICD-10-CM | POA: Diagnosis not present

## 2017-08-24 ENCOUNTER — Ambulatory Visit
Admission: RE | Admit: 2017-08-24 | Discharge: 2017-08-24 | Disposition: A | Discharge status: Home / Self Care | Payer: 59 | Source: Ambulatory Visit | Attending: Geriatric Medicine | Admitting: Geriatric Medicine

## 2017-08-24 DIAGNOSIS — Z1231 Encounter for screening mammogram for malignant neoplasm of breast: Secondary | ICD-10-CM

## 2017-09-05 DIAGNOSIS — R05 Cough: Secondary | ICD-10-CM | POA: Diagnosis not present

## 2017-09-05 DIAGNOSIS — J329 Chronic sinusitis, unspecified: Secondary | ICD-10-CM | POA: Diagnosis not present

## 2017-10-12 DIAGNOSIS — M79674 Pain in right toe(s): Secondary | ICD-10-CM | POA: Diagnosis not present

## 2017-10-12 DIAGNOSIS — J3489 Other specified disorders of nose and nasal sinuses: Secondary | ICD-10-CM | POA: Diagnosis not present

## 2017-11-02 DIAGNOSIS — Z79899 Other long term (current) drug therapy: Secondary | ICD-10-CM | POA: Diagnosis not present

## 2017-11-02 DIAGNOSIS — Z23 Encounter for immunization: Secondary | ICD-10-CM | POA: Diagnosis not present

## 2017-11-02 DIAGNOSIS — E119 Type 2 diabetes mellitus without complications: Secondary | ICD-10-CM | POA: Diagnosis not present

## 2017-11-02 DIAGNOSIS — E7800 Pure hypercholesterolemia, unspecified: Secondary | ICD-10-CM | POA: Diagnosis not present

## 2017-11-02 DIAGNOSIS — I10 Essential (primary) hypertension: Secondary | ICD-10-CM | POA: Diagnosis not present

## 2017-11-04 ENCOUNTER — Other Ambulatory Visit: Payer: Self-pay | Admitting: Geriatric Medicine

## 2017-11-04 DIAGNOSIS — M5412 Radiculopathy, cervical region: Secondary | ICD-10-CM

## 2017-11-13 ENCOUNTER — Other Ambulatory Visit: Payer: 59

## 2017-11-15 ENCOUNTER — Ambulatory Visit: Payer: 59 | Admitting: Allergy and Immunology

## 2017-12-12 DIAGNOSIS — M797 Fibromyalgia: Secondary | ICD-10-CM | POA: Diagnosis not present

## 2017-12-17 ENCOUNTER — Ambulatory Visit
Admission: RE | Admit: 2017-12-17 | Discharge: 2017-12-17 | Disposition: A | Discharge status: Home / Self Care | Payer: 59 | Source: Ambulatory Visit | Attending: Geriatric Medicine | Admitting: Geriatric Medicine

## 2017-12-17 DIAGNOSIS — M47814 Spondylosis without myelopathy or radiculopathy, thoracic region: Secondary | ICD-10-CM | POA: Diagnosis not present

## 2017-12-17 DIAGNOSIS — M5412 Radiculopathy, cervical region: Secondary | ICD-10-CM

## 2017-12-17 DIAGNOSIS — M4802 Spinal stenosis, cervical region: Secondary | ICD-10-CM | POA: Diagnosis not present

## 2018-01-05 ENCOUNTER — Other Ambulatory Visit (HOSPITAL_COMMUNITY)
Admission: RE | Admit: 2018-01-05 | Discharge: 2018-01-05 | Disposition: A | Discharge status: Home / Self Care | Payer: 59 | Source: Ambulatory Visit | Attending: Nurse Practitioner | Admitting: Nurse Practitioner

## 2018-01-05 ENCOUNTER — Other Ambulatory Visit: Payer: Self-pay | Admitting: Nurse Practitioner

## 2018-01-05 DIAGNOSIS — N760 Acute vaginitis: Secondary | ICD-10-CM | POA: Diagnosis not present

## 2018-01-05 DIAGNOSIS — Z01419 Encounter for gynecological examination (general) (routine) without abnormal findings: Secondary | ICD-10-CM | POA: Insufficient documentation

## 2018-01-05 DIAGNOSIS — M898X1 Other specified disorders of bone, shoulder: Secondary | ICD-10-CM | POA: Diagnosis not present

## 2018-01-05 DIAGNOSIS — L989 Disorder of the skin and subcutaneous tissue, unspecified: Secondary | ICD-10-CM | POA: Diagnosis not present

## 2018-01-06 ENCOUNTER — Other Ambulatory Visit: Payer: Self-pay | Admitting: Nurse Practitioner

## 2018-01-06 ENCOUNTER — Ambulatory Visit
Admission: RE | Admit: 2018-01-06 | Discharge: 2018-01-06 | Disposition: A | Discharge status: Home / Self Care | Payer: 59 | Source: Ambulatory Visit | Attending: Nurse Practitioner | Admitting: Nurse Practitioner

## 2018-01-06 DIAGNOSIS — R221 Localized swelling, mass and lump, neck: Secondary | ICD-10-CM

## 2018-01-10 ENCOUNTER — Encounter: Payer: Self-pay | Admitting: Allergy and Immunology

## 2018-01-10 ENCOUNTER — Ambulatory Visit (INDEPENDENT_AMBULATORY_CARE_PROVIDER_SITE_OTHER): Payer: 59 | Admitting: Allergy and Immunology

## 2018-01-10 VITALS — BP 122/80 | HR 72 | Temp 99.1°F | Ht 66.0 in | Wt 216.4 lb

## 2018-01-10 DIAGNOSIS — J3089 Other allergic rhinitis: Secondary | ICD-10-CM | POA: Diagnosis not present

## 2018-01-10 DIAGNOSIS — K219 Gastro-esophageal reflux disease without esophagitis: Secondary | ICD-10-CM | POA: Diagnosis not present

## 2018-01-10 DIAGNOSIS — J4520 Mild intermittent asthma, uncomplicated: Secondary | ICD-10-CM

## 2018-01-10 LAB — CYTOLOGY - PAP
Diagnosis: NEGATIVE
HPV: NOT DETECTED

## 2018-01-10 NOTE — Patient Instructions (Addendum)
  1.  Allergen avoidance measures?  2.  Treat inflammation:   A.  Dymista -1 spray each nostril twice a day  B.  Montelukast 10 mg -1 tablet once a day  3.  Treat reflux:   A.  Consolidate all caffeine and chocolate consumption  B.  Increase Nexium 40 mg twice a day  C.  Start ranitidine 300 mg in evening  4.  Treat headache:   A.  Consolidate all caffeine and chocolate consumption  5.  Discontinue weekly administration of alendronate for next 4 weeks  6.  If needed:   A.  Nasal saline spray  B.  OTC antihistamine  C.  Pro Air HFA 2 puffs every 4-6 hours  7.  Return to clinic in 3 weeks or earlier if problem  8.  Further evaluation and treatment?  X-rays?

## 2018-01-10 NOTE — Progress Notes (Signed)
Dear Dr. Felipa Eth,  Thank you for referring Rhonda Bradley to the [facility] on 01/10/2018.   Below is a summation of this patient's evaluation and recommendations.  Thank you for your referral. I will keep you informed about this patient's response to treatment.   If you have any questions please do not hesitate to contact me.   Sincerely,  Jiles Prows, MD Allergy / Immunology [place] [facility]   ______________________________________________________________________    NEW PATIENT NOTE  Referring Provider: Lajean Manes, MD Primary Provider: Lajean Manes, MD Date of office visit: 01/10/2018    Subjective:   Chief Complaint:  Rhonda Bradley (DOB: January 07, 1966) is a 52 y.o. female who presents to the clinic on 01/10/2018 with a chief complaint of Allergy Testing; Cough; and Sinus Problem .     HPI: Rhonda Bradley presents to this clinic in evaluation of respiratory tract problems.  Apparently I had seen her in this clinic over 5 years ago for an issue with allergic rhinitis and cough.  She states that she did very well with medical treatment regarding all of her respiratory tract symptoms up until 2018.  At that point in time she developed issues with coughing and postnasal drip and some nasal congestion and some occasional bloody nasal discharge and a froggy voice.  In addition to these chronic symptoms that have been present for the past year she also had an acute episode of sinusitis in October 2018 manifested as fever and yellow nasal discharge but for the most part these sinus headache issues have resolved.  She does not have any associated shortness of breath or chest tightness but there may be a component of exercise-induced cough.  She does not have any posttussive micturition or vomiting.  She has been treated with an inhaled steroid which did not help her and she has been off  that inhaled steroid for the past 3 months.  She has tried a short acting bronchodilator which did not really help this issue.  She gets no relief when she uses this medication.  She does have reflux disease that she thinks is under good control while using Nexium every day.  She drinks 2 cups of coffee per day and occasional tea and occasional soda and has daily chocolate.  She has ethanol just a few times per month.  It should be noted that she has been using alendronate for the past year which correlates with her respiratory tract symptoms.  Past Medical History:  Diagnosis Date  . Anemia    as a child   . Anxiety   . Asthma   . Complication of anesthesia    slow to wake up age 40, but since cholecystectomy and no problems   . Depression   . Family history of anesthesia complication    father slow to wake up   . Fibromyalgia   . GERD (gastroesophageal reflux disease)   . Gestational diabetes   . Headache   . Hypertension     Past Surgical History:  Procedure Laterality Date  . CESAREAN SECTION     x 3  . CHOLECYSTECTOMY    . THYROIDECTOMY N/A 09/19/2014   Procedure: THYROIDECTOMY;  Surgeon: Armandina Gemma, MD;  Location: [facility];  Service: General;  Laterality: N/A;    Allergies as of 01/10/2018      Reactions   Erythromycin Nausea And Vomiting   Flagyl [metronidazole] Rash  Penicillins Rash      Medication List      albuterol 108 (90 Base) MCG/ACT inhaler Commonly known as:  PROVENTIL HFA;VENTOLIN HFA Inhale 2 puffs into the lungs every 4 (four) hours as needed for wheezing or shortness of breath.   alendronate 70 MG tablet Commonly known as:  FOSAMAX Take 70 mg by mouth once a week.   atorvastatin 20 MG tablet Commonly known as:  LIPITOR Take 20 mg by mouth daily.   benzonatate 100 MG capsule Commonly known as:  TESSALON TAKE 1 CAPSULE BY MOUTH THREE TIMES A DAY AS NEEDED FOR 7 DAYS   calcium carbonate 1250 (500 Ca) MG tablet Commonly known as:  OS-CAL -  dosed in mg of elemental calcium Take 2 tablets (1,000 mg of elemental calcium total) by mouth 2 (two) times daily with a meal.   citalopram 40 MG tablet Commonly known as:  CELEXA Take 40 mg by mouth daily.   diphenhydramine-acetaminophen 25-500 MG Tabs tablet Commonly known as:  TYLENOL PM Take 2 tablets by mouth at bedtime as needed (sleep).   FLONASE ALLERGY RELIEF 50 MCG/ACT nasal spray Generic drug:  fluticasone Place 1 spray into both nostrils 2 (two) times daily.   furosemide 40 MG tablet Commonly known as:  LASIX Take 80 mg by mouth daily. Patient takes as needed per patient   levothyroxine 100 MCG tablet Commonly known as:  SYNTHROID, LEVOTHROID TAKE 1 TABLET ONCE DAILY ON AN EMPTY STOMACH 30 MINUTES BEFORE BREAKFAST   liothyronine 5 MCG tablet Commonly known as:  CYTOMEL TAKE 1 TABLET BY MOUTH TWICE A DAY ON AN EMPTY STOMACH   loratadine 10 MG tablet Commonly known as:  CLARITIN Take 10 mg by mouth every morning.   losartan 25 MG tablet Commonly known as:  COZAAR Take 25 mg by mouth every morning.   Melatonin 10 MG Tabs Take 1 tablet by mouth at bedtime.   meloxicam 15 MG tablet Commonly known as:  MOBIC 1 TABLET ONCE A DAY AS NEEDED ORALLY 30   metFORMIN 500 MG 24 hr tablet Commonly known as:  GLUCOPHAGE-XR 2 TABLETS WITH EVENING MEAL ONCE A DAY ORALLY 30 DAY(S)   montelukast 10 MG tablet Commonly known as:  SINGULAIR TAKE 1 TABLET BY MOUTH IN THE EVENING ONCE A DAY FOR 90 DAYS   polyethylene glycol packet Commonly known as:  MIRALAX / GLYCOLAX Take 17 g by mouth daily.   tiZANidine 4 MG tablet Commonly known as:  ZANAFLEX       Review of systems negative except as noted in HPI / PMHx or noted below:  Review of Systems  Constitutional: Negative.   HENT: Negative.   Eyes: Negative.   Respiratory: Negative.   Cardiovascular: Negative.   Gastrointestinal: Negative.   Genitourinary: Negative.   Musculoskeletal: Negative.   Skin: Negative.    Neurological: Negative.   Endo/Heme/Allergies: Negative.   Psychiatric/Behavioral: Negative.     Family History  Problem Relation Age of Onset  . Breast cancer Maternal Aunt   . Allergic rhinitis Mother   . Asthma Mother   . Eczema Father   . Angioedema Maternal Grandfather   . Urticaria Maternal Grandfather     Social History   Socioeconomic History  . Marital status: Married    Spouse name: Not on file  . Number of children: Not on file  . Years of education: Not on file  . Highest education level: Not on file  Social Needs  . Financial resource strain:  Not on file  . Food insecurity - worry: Not on file  . Food insecurity - inability: Not on file  . Transportation needs - medical: Not on file  . Transportation needs - non-medical: Not on file  Occupational History  . Not on file  Tobacco Use  . Smoking status: Never Smoker  . Smokeless tobacco: Never Used  Substance and Sexual Activity  . Alcohol use: Yes    Comment: socially   . Drug use: No  . Sexual activity: Not on file  Other Topics Concern  . Not on file  Social History Narrative  . Not on file    Environmental and Social history  Lives in a townhouse with a dry environment, a dog located inside the household, carpet in the bedroom, plastic on the bed, no plastic on the pillow, and no smokers located inside the household.  She works in an office setting.  Objective:   Vitals:   01/10/18 1332  BP: 122/80  Pulse: 72  Temp: 99.1 F (37.3 C)   Height: 5\' 6"  (167.6 cm) Weight: 216 lb 6.4 oz (98.2 kg)  Physical Exam  Constitutional: She is well-developed, well-nourished, and in no distress.  HENT:  Head: Normocephalic. Head is without right periorbital erythema and without left periorbital erythema.  Right Ear: Tympanic membrane, external ear and ear canal normal.  Left Ear: Tympanic membrane, external ear and ear canal normal.  Nose: Nose normal. No mucosal edema or rhinorrhea.  Mouth/Throat:  Uvula is midline, oropharynx is clear and moist and mucous membranes are normal. No oropharyngeal exudate.  Eyes: Conjunctivae and lids are normal. Pupils are equal, round, and reactive to light.  Neck: Trachea normal. No tracheal tenderness present. No tracheal deviation present. No thyromegaly present.  Cardiovascular: Normal rate, regular rhythm, S1 normal, S2 normal and normal heart sounds.  No murmur heard. Pulmonary/Chest: Effort normal and breath sounds normal. No stridor. No tachypnea. No respiratory distress. She has no wheezes. She has no rales. She exhibits no tenderness.  Abdominal: Soft. She exhibits no distension and no mass. There is no hepatosplenomegaly. There is no tenderness. There is no rebound and no guarding.  Musculoskeletal: She exhibits no edema or tenderness.  Lymphadenopathy:       Head (right side): No tonsillar adenopathy present.       Head (left side): No tonsillar adenopathy present.    She has no cervical adenopathy.    She has no axillary adenopathy.  Neurological: She is alert. Gait normal.  Skin: No rash noted. She is not diaphoretic. No erythema. No pallor. Nails show no clubbing.  Psychiatric: Mood and affect normal.    Diagnostics: Allergy skin tests were performed.  She demonstrated hypersensitivity to mold.  Spirometry was performed and demonstrated an FEV1 of 2.56 @ 87 % of predicted. FEV1/FVC = 0.87.  Following the administration of nebulized albuterol her FEV1 rose to 2.70 which was an increase in the FEV1 of 5%.   Results of a chest x-ray obtained 29 March 2017 identified the following:  No active infiltrate or effusion is seen. Mediastinal and hilar contours are unremarkable. The heart is within normal limits in  size. No acute bony abnormality is seen. There may be cervical ribs present.   Assessment and Plan:    1. Asthma, mild intermittent, well-controlled   2. Other allergic rhinitis   3. LPRD (laryngopharyngeal reflux disease)      1.  Allergen avoidance measures?  2.  Treat inflammation:   A.  Dymista -1 spray each nostril twice a day  B.  Montelukast 10 mg -1 tablet once a day  3.  Treat reflux:   A.  Consolidate all caffeine and chocolate consumption  B.  Increase Nexium 40 mg twice a day  C.  Start ranitidine 300 mg in evening  4.  Treat headache:   A.  Consolidate all caffeine and chocolate consumption  5.  Discontinue weekly administration of alendronate for next 4 weeks  6.  If needed:   A.  Nasal saline spray  B.  OTC antihistamine  C.  Pro Air HFA 2 puffs every 4-6 hours  7.  Return to clinic in 3 weeks or earlier if problem  8.  Further evaluation and treatment?  X-rays?  Hendy has an inflamed and irritated respiratory tract and we will treat her with the medications noted above which is an attempt to address both inflammation and reflux induced respiratory disease.  As well, her symptoms do correlate with the administration of alendronate and it is quite possible that she is refluxing this material up near her airway resulting in significant mucosal irritation.  We will see how she does without the use of alendronate for the next 4 weeks.  I will regroup with her and make a determination about further evaluation and treatment based upon her response.  Jiles Prows, MD Allergy / Immunology [facility]

## 2018-01-12 DIAGNOSIS — D1801 Hemangioma of skin and subcutaneous tissue: Secondary | ICD-10-CM | POA: Diagnosis not present

## 2018-01-12 DIAGNOSIS — L821 Other seborrheic keratosis: Secondary | ICD-10-CM | POA: Diagnosis not present

## 2018-01-12 DIAGNOSIS — L820 Inflamed seborrheic keratosis: Secondary | ICD-10-CM | POA: Diagnosis not present

## 2018-01-12 DIAGNOSIS — B078 Other viral warts: Secondary | ICD-10-CM | POA: Diagnosis not present

## 2018-01-12 DIAGNOSIS — D225 Melanocytic nevi of trunk: Secondary | ICD-10-CM | POA: Diagnosis not present

## 2018-01-12 MED ORDER — AZELASTINE-FLUTICASONE 137-50 MCG/ACT NA SUSP: 2.0000 | NASAL | 5 refills | Status: DC

## 2018-01-12 MED ORDER — ESOMEPRAZOLE MAGNESIUM 40 MG PO CPDR: 40.0000 mg | DELAYED_RELEASE_CAPSULE | Freq: Two times a day (BID) | ORAL | 5 refills | Status: DC

## 2018-01-12 MED ORDER — RANITIDINE HCL 300 MG PO CAPS: 300.0000 mg | ORAL_CAPSULE | Freq: Every evening | ORAL | 5 refills | Status: DC

## 2018-01-12 MED ORDER — MONTELUKAST SODIUM 10 MG PO TABS: 10.0000 mg | ORAL_TABLET | Freq: Every day | ORAL | 5 refills | Status: DC

## 2018-01-13 ENCOUNTER — Other Ambulatory Visit: Payer: Self-pay | Admitting: Geriatric Medicine

## 2018-01-13 ENCOUNTER — Ambulatory Visit
Admission: RE | Admit: 2018-01-13 | Discharge: 2018-01-13 | Disposition: A | Discharge status: Home / Self Care | Payer: 59 | Source: Ambulatory Visit | Attending: Geriatric Medicine | Admitting: Geriatric Medicine

## 2018-01-13 DIAGNOSIS — R05 Cough: Secondary | ICD-10-CM | POA: Diagnosis not present

## 2018-01-13 DIAGNOSIS — M25511 Pain in right shoulder: Secondary | ICD-10-CM | POA: Diagnosis not present

## 2018-01-13 DIAGNOSIS — R059 Cough, unspecified: Secondary | ICD-10-CM

## 2018-01-13 DIAGNOSIS — M7989 Other specified soft tissue disorders: Secondary | ICD-10-CM | POA: Diagnosis not present

## 2018-01-30 ENCOUNTER — Encounter: Payer: Self-pay | Admitting: Allergy and Immunology

## 2018-01-30 ENCOUNTER — Telehealth: Payer: Self-pay | Admitting: Allergy and Immunology

## 2018-01-30 NOTE — Telephone Encounter (Signed)
Please have her use samples of Dymista and also provide her with a discount coupon

## 2018-01-30 NOTE — Telephone Encounter (Signed)
Called and left message for patient to call office in regards to this matter. 

## 2018-01-30 NOTE — Telephone Encounter (Signed)
Patient informed me that she got her dymista filled and has been doing it for a couple weeks, she stated that she ended up dropping it and now it doesn't work. She was calling to inform us that her insurance only covers one per month and she has a follow up appointment and don't think it would be beneficial if she isn't using the medication prescribed. She wanted to know is there another nasal spray that is cheaper. She stated that the Nasal spray she was using before didn't even cost her 40 for a 90 day supply. Please advise

## 2018-01-30 NOTE — Telephone Encounter (Signed)
Patient was placed on a new nasal spray and it was damaged. Pt called pharmacy and they told her only one per month can be issued. Patient is asking if there is an alternative at a cheaper price than $40.00 that can be given instead. Please call pt back. Thanks

## 2018-01-30 NOTE — Telephone Encounter (Signed)
Spoke to patient advised we would give a sample of Dymista and coupon. Advised patient sample will be placed up front on Wednesday since it is coming from a different office. Patient verbalized understanding.

## 2018-02-07 ENCOUNTER — Ambulatory Visit (INDEPENDENT_AMBULATORY_CARE_PROVIDER_SITE_OTHER): Payer: 59 | Admitting: Allergy and Immunology

## 2018-02-07 ENCOUNTER — Encounter: Payer: Self-pay | Admitting: Allergy and Immunology

## 2018-02-07 VITALS — BP 114/76 | HR 76 | Resp 16

## 2018-02-07 DIAGNOSIS — K219 Gastro-esophageal reflux disease without esophagitis: Secondary | ICD-10-CM | POA: Diagnosis not present

## 2018-02-07 DIAGNOSIS — J3089 Other allergic rhinitis: Secondary | ICD-10-CM

## 2018-02-07 DIAGNOSIS — J4520 Mild intermittent asthma, uncomplicated: Secondary | ICD-10-CM

## 2018-02-07 DIAGNOSIS — M818 Other osteoporosis without current pathological fracture: Secondary | ICD-10-CM

## 2018-02-07 NOTE — Patient Instructions (Addendum)
  1.  Continue to Treat inflammation:   A.  Dymista -1 spray each nostril twice a day  B.  Montelukast 10 mg -1 tablet once a day  2.  Continue to Treat reflux:   A.  Consolidate all caffeine and chocolate consumption  B.  Nexium 40 mg twice a day  C.  ranitidine 300 mg in evening  4.  Continue to Treat headache:   A.  Consolidate all caffeine and chocolate consumption  5.   RESTART Alendronate  6.  If needed:   A.  Nasal saline spray  B.  OTC antihistamine  C.  Pro Air HFA 2 puffs every 4-6 hours  7.  Return to clinic in 12 weeks or earlier if problem

## 2018-02-07 NOTE — Progress Notes (Signed)
Follow-up Note  Referring Provider: Lajean Manes, MD Primary Provider: Lajean Manes, MD Date of Office Visit: 02/07/2018  Subjective:   Rhonda Bradley (DOB: 20-Apr-1966) is a 52 y.o. female who returns to the [facility] [place] on 02/07/2018 in re-evaluation of the following:  HPI: Rhonda Bradley returns to this clinic in reevaluation of her respiratory tract symptoms with a combination of atopic respiratory disease and reflux induced respiratory disease.  Her last visit to this clinic was her initial evaluation of 10 January 2018.  She is better.  She has much less cough and less throat clearing and less issues with drip and less issues with nasal congestion and bloody nasal discharge.  She has been consistently using her medical therapy which includes anti-inflammatory agents for her respiratory tract, aggressive therapy directed against reflux, and elimination of her alendronate.  For some reason she is only using her Nexium once a day.  Allergies as of 02/07/2018      Reactions   Erythromycin Nausea And Vomiting   Flagyl [metronidazole] Rash   Penicillins Rash      Medication List      albuterol 108 (90 Base) MCG/ACT inhaler Commonly known as:  PROVENTIL HFA;VENTOLIN HFA Inhale 2 puffs into the lungs every 4 (four) hours as needed for wheezing or shortness of breath.   atorvastatin 20 MG tablet Commonly known as:  LIPITOR Take 20 mg by mouth daily.   Azelastine-Fluticasone 137-50 MCG/ACT Susp Commonly known as:  DYMISTA Place 2 sprays into both nostrils 1 day or 1 dose.   calcium carbonate 1250 (500 Ca) MG tablet Commonly known as:  OS-CAL - dosed in mg of elemental calcium Take 2 tablets (1,000 mg of elemental calcium total) by mouth 2 (two) times daily with a meal.   citalopram 40 MG tablet Commonly known as:  CELEXA Take 40 mg by mouth daily.   diphenhydramine-acetaminophen 25-500 MG Tabs tablet Commonly known as:  TYLENOL PM Take 2 tablets by mouth  at bedtime as needed (sleep).   esomeprazole 40 MG capsule Commonly known as:  NEXIUM Take 1 capsule (40 mg total) by mouth 2 (two) times daily.   furosemide 40 MG tablet Commonly known as:  LASIX Take 80 mg by mouth daily. Patient takes as needed per patient   levothyroxine 100 MCG tablet Commonly known as:  SYNTHROID, LEVOTHROID TAKE 1 TABLET ONCE DAILY ON AN EMPTY STOMACH 30 MINUTES BEFORE BREAKFAST   liothyronine 5 MCG tablet Commonly known as:  CYTOMEL TAKE 1 TABLET BY MOUTH TWICE A DAY ON AN EMPTY STOMACH   loratadine 10 MG tablet Commonly known as:  CLARITIN Take 10 mg by mouth every morning.   losartan 25 MG tablet Commonly known as:  COZAAR Take 25 mg by mouth every morning.   meloxicam 15 MG tablet Commonly known as:  MOBIC 1 TABLET ONCE A DAY AS NEEDED ORALLY 30   metFORMIN 500 MG 24 hr tablet Commonly known as:  GLUCOPHAGE-XR 2 TABLETS WITH EVENING MEAL ONCE A DAY ORALLY 30 DAY(S)   montelukast 10 MG tablet Commonly known as:  SINGULAIR Take 1 tablet (10 mg total) by mouth at bedtime.   ranitidine 300 MG capsule Commonly known as:  ZANTAC Take 1 capsule (300 mg total) by mouth every evening.   tiZANidine 4 MG tablet Commonly known as:  ZANAFLEX       Past Medical History:  Diagnosis Date  . Anemia    as a child   . Anxiety   .  Asthma   . Complication of anesthesia    slow to wake up age 52, but since cholecystectomy and no problems   . Depression   . Family history of anesthesia complication    father slow to wake up   . Fibromyalgia   . GERD (gastroesophageal reflux disease)   . Gestational diabetes   . Headache   . Hypertension     Past Surgical History:  Procedure Laterality Date  . CESAREAN SECTION     x 3  . CHOLECYSTECTOMY    . THYROIDECTOMY N/A 09/19/2014   Procedure: THYROIDECTOMY;  Surgeon: Armandina Gemma, MD;  Location: [facility];  Service: General;  Laterality: N/A;    Review of systems negative except as noted in HPI / PMHx  or noted below:  Review of Systems  Constitutional: Negative.   HENT: Negative.   Eyes: Negative.   Respiratory: Negative.   Cardiovascular: Negative.   Gastrointestinal: Negative.   Genitourinary: Negative.   Musculoskeletal: Negative.   Skin: Negative.   Neurological: Negative.   Endo/Heme/Allergies: Negative.   Psychiatric/Behavioral: Negative.      Objective:   Vitals:   02/07/18 1656  BP: 114/76  Pulse: 76  Resp: 16          Physical Exam  Constitutional: She is well-developed, well-nourished, and in no distress.  HENT:  Head: Normocephalic.  Right Ear: Tympanic membrane, external ear and ear canal normal.  Left Ear: Tympanic membrane, external ear and ear canal normal.  Nose: Nose normal. No mucosal edema or rhinorrhea.  Mouth/Throat: Uvula is midline, oropharynx is clear and moist and mucous membranes are normal. No oropharyngeal exudate.  Eyes: Conjunctivae are normal.  Neck: Trachea normal. No tracheal tenderness present. No tracheal deviation present. No thyromegaly present.  Cardiovascular: Normal rate, regular rhythm, S1 normal, S2 normal and normal heart sounds.  No murmur heard. Pulmonary/Chest: Breath sounds normal. No stridor. No respiratory distress. She has no wheezes. She has no rales.  Musculoskeletal: She exhibits no edema.  Lymphadenopathy:       Head (right side): No tonsillar adenopathy present.       Head (left side): No tonsillar adenopathy present.    She has no cervical adenopathy.  Neurological: She is alert. Gait normal.  Skin: No rash noted. She is not diaphoretic. No erythema. Nails show no clubbing.  Psychiatric: Mood and affect normal.    Diagnostics:   The patient had an Asthma Control Test with the following results: ACT Total Score: 17.    Assessment and Plan:   1. Other allergic rhinitis   2. Asthma, mild intermittent, well-controlled   3. LPRD (laryngopharyngeal reflux disease)   4. Other osteoporosis without current  pathological fracture     1.  Continue to Treat inflammation:   A.  Dymista -1 spray each nostril twice a day  B.  Montelukast 10 mg -1 tablet once a day  2.  Continue to Treat reflux:   A.  Consolidate all caffeine and chocolate consumption  B.  Nexium 40 mg twice a day  C.  ranitidine 300 mg in evening  4.  Continue to Treat headache:   A.  Consolidate all caffeine and chocolate consumption  5.   RESTART Alendronate  6.  If needed:   A.  Nasal saline spray  B.  OTC antihistamine  C.  Pro Air HFA 2 puffs every 4-6 hours  7.  Return to clinic in 12 weeks or earlier if problem  Velva has had improvement  on her current therapy and she will continue to utilize therapy directed against respiratory tract inflammation and reflux.  Now we need to determine whether or not alendronate was contributing to some of her cough as a result of a a retrograde esophageal flow mechanism.  She will keep all other therapy the same and restart her alendronate and will see what happens over the course of the next month or so.  If she does well I will see her back in this clinic in 12 weeks at which point in time there should be an opportunity to consolidate her treatment.  Allena Katz, MD Allergy / Immunology [place]

## 2018-02-08 ENCOUNTER — Encounter: Payer: Self-pay | Admitting: Allergy and Immunology

## 2018-02-17 DIAGNOSIS — E890 Postprocedural hypothyroidism: Secondary | ICD-10-CM | POA: Diagnosis not present

## 2018-03-03 DIAGNOSIS — R52 Pain, unspecified: Secondary | ICD-10-CM | POA: Diagnosis not present

## 2018-03-03 DIAGNOSIS — J020 Streptococcal pharyngitis: Secondary | ICD-10-CM | POA: Diagnosis not present

## 2018-03-03 DIAGNOSIS — J029 Acute pharyngitis, unspecified: Secondary | ICD-10-CM | POA: Diagnosis not present

## 2018-03-28 ENCOUNTER — Encounter: Payer: Self-pay | Admitting: Allergy and Immunology

## 2018-03-28 ENCOUNTER — Ambulatory Visit (INDEPENDENT_AMBULATORY_CARE_PROVIDER_SITE_OTHER): Payer: 59 | Admitting: Allergy and Immunology

## 2018-03-28 VITALS — BP 154/80 | HR 84 | Resp 16

## 2018-03-28 DIAGNOSIS — R062 Wheezing: Secondary | ICD-10-CM | POA: Diagnosis not present

## 2018-03-28 DIAGNOSIS — M818 Other osteoporosis without current pathological fracture: Secondary | ICD-10-CM

## 2018-03-28 DIAGNOSIS — J3089 Other allergic rhinitis: Secondary | ICD-10-CM

## 2018-03-28 DIAGNOSIS — K219 Gastro-esophageal reflux disease without esophagitis: Secondary | ICD-10-CM

## 2018-03-28 DIAGNOSIS — J4540 Moderate persistent asthma, uncomplicated: Secondary | ICD-10-CM | POA: Diagnosis not present

## 2018-03-28 DIAGNOSIS — R05 Cough: Secondary | ICD-10-CM | POA: Diagnosis not present

## 2018-03-28 NOTE — Progress Notes (Signed)
Follow-up Note  Referring Provider: Lajean Manes, MD Primary Provider: Lajean Manes, MD Date of Office Visit: 03/28/2018  Subjective:   Rhonda Bradley (DOB: 01-16-1966) is a 52 y.o. female who returns to the [facility] on 03/28/2018 in re-evaluation of the following:  HPI: Rhonda Bradley returns to this clinic in reevaluation of her cough.  Her last visit to this clinic was 07 February 2018.  When I last saw her in this clinic she was doing much better regarding her cough and her throat clearing and drainage from her head.  At that point in time we asked her to reinitiate alendronate to work through the issue of whether or not this was a trigger for her cough.  Since restarting alendronate and she has redeveloped an unrelenting cough.  However, it should be noted that she had strep throat 2 weeks ago or so requiring an antibiotic and that event made her cough very active.  As well there has been a cat introduced into the house at the end of March although on previous skin testing she did not demonstrate hypersensitivity to cat.  She believes that her reflux is under good control.  She has been using a short acting bronchodilator 4 times a day which has done absolutely nothing regarding her cough.  This morning she visited with her primary care office who gave her nebulized albuterol and a sample of Symbicort.  Allergies as of 03/28/2018      Reactions   Erythromycin Nausea And Vomiting   Flagyl [metronidazole] Rash   Penicillins Rash      Medication List      albuterol 108 (90 Base) MCG/ACT inhaler Commonly known as:  PROVENTIL HFA;VENTOLIN HFA Inhale 2 puffs into the lungs every 4 (four) hours as needed for wheezing or shortness of breath.   alendronate 70 MG tablet Commonly known as:  FOSAMAX TAKE 1 TABLET WEEKLY   atorvastatin 20 MG tablet Commonly known as:  LIPITOR Take 20 mg by mouth daily.   Azelastine-Fluticasone 137-50 MCG/ACT Susp Commonly known  as:  DYMISTA Place 2 sprays into both nostrils 1 day or 1 dose.   calcium carbonate 1250 (500 Ca) MG tablet Commonly known as:  OS-CAL - dosed in mg of elemental calcium Take 2 tablets (1,000 mg of elemental calcium total) by mouth 2 (two) times daily with a meal.   citalopram 40 MG tablet Commonly known as:  CELEXA Take 40 mg by mouth daily.   diphenhydramine-acetaminophen 25-500 MG Tabs tablet Commonly known as:  TYLENOL PM Take 2 tablets by mouth at bedtime as needed (sleep).   esomeprazole 40 MG capsule Commonly known as:  NEXIUM Take 1 capsule (40 mg total) by mouth 2 (two) times daily.   furosemide 40 MG tablet Commonly known as:  LASIX Take 80 mg by mouth daily. Patient takes as needed per patient   levothyroxine 100 MCG tablet Commonly known as:  SYNTHROID, LEVOTHROID TAKE 1 TABLET ONCE DAILY ON AN EMPTY STOMACH 30 MINUTES BEFORE BREAKFAST   liothyronine 5 MCG tablet Commonly known as:  CYTOMEL TAKE 1 TABLET BY MOUTH TWICE A DAY ON AN EMPTY STOMACH   loratadine 10 MG tablet Commonly known as:  CLARITIN Take 10 mg by mouth every morning.   losartan 25 MG tablet Commonly known as:  COZAAR Take 25 mg by mouth every morning.   losartan 50 MG tablet Commonly known as:  COZAAR Take 50 mg by mouth daily.   meloxicam 15 MG  tablet Commonly known as:  MOBIC 1 TABLET ONCE A DAY AS NEEDED ORALLY 30   metFORMIN 500 MG 24 hr tablet Commonly known as:  GLUCOPHAGE-XR 2 TABLETS WITH EVENING MEAL ONCE A DAY ORALLY 30 DAY(S)   montelukast 10 MG tablet Commonly known as:  SINGULAIR Take 1 tablet (10 mg total) by mouth at bedtime.   ranitidine 300 MG capsule Commonly known as:  ZANTAC Take 1 capsule (300 mg total) by mouth every evening.   tiZANidine 4 MG tablet Commonly known as:  ZANAFLEX       Past Medical History:  Diagnosis Date  . Anemia    as a child   . Anxiety   . Asthma   . Complication of anesthesia    slow to wake up age 31, but since  cholecystectomy and no problems   . Depression   . Family history of anesthesia complication    father slow to wake up   . Fibromyalgia   . GERD (gastroesophageal reflux disease)   . Gestational diabetes   . Headache   . Hypertension     Past Surgical History:  Procedure Laterality Date  . CESAREAN SECTION     x 3  . CHOLECYSTECTOMY    . THYROIDECTOMY N/A 09/19/2014   Procedure: THYROIDECTOMY;  Surgeon: Armandina Gemma, MD;  Location: [facility];  Service: General;  Laterality: N/A;    Review of systems negative except as noted in HPI / PMHx or noted below:  Review of Systems  Constitutional: Negative.   HENT: Negative.   Eyes: Negative.   Respiratory: Negative.   Cardiovascular: Negative.   Gastrointestinal: Negative.   Genitourinary: Negative.   Musculoskeletal: Negative.   Skin: Negative.   Neurological: Negative.   Endo/Heme/Allergies: Negative.   Psychiatric/Behavioral: Negative.      Objective:   Vitals:   03/28/18 1620  BP: (!) 154/80  Pulse: 84  Resp: 16          Physical Exam  Constitutional:  Throat clearing  HENT:  Head: Normocephalic.  Right Ear: Tympanic membrane, external ear and ear canal normal.  Left Ear: Tympanic membrane, external ear and ear canal normal.  Nose: Nose normal. No mucosal edema or rhinorrhea.  Mouth/Throat: Uvula is midline, oropharynx is clear and moist and mucous membranes are normal. No oropharyngeal exudate.  Eyes: Conjunctivae are normal.  Neck: Trachea normal. No tracheal tenderness present. No tracheal deviation present. No thyromegaly present.  Cardiovascular: Normal rate, regular rhythm, S1 normal, S2 normal and normal heart sounds.  No murmur heard. Pulmonary/Chest: Breath sounds normal. No stridor. No respiratory distress. She has no wheezes. She has no rales.  Musculoskeletal: She exhibits no edema.  Lymphadenopathy:       Head (right side): No tonsillar adenopathy present.       Head (left side): No tonsillar  adenopathy present.    She has no cervical adenopathy.  Neurological: She is alert.  Skin: No rash noted. She is not diaphoretic. No erythema. Nails show no clubbing.    Diagnostics:    Spirometry was performed and demonstrated an FEV1 of 2.73 at 95 % of predicted.  The patient had an Asthma Control Test with the following results: ACT Total Score: 7.    Assessment and Plan:   1. Not well controlled moderate persistent asthma   2. Other allergic rhinitis   3. LPRD (laryngopharyngeal reflux disease)   4. Other osteoporosis without current pathological fracture     1.  Continue to Treat inflammation:  A.  Dymista -1 spray each nostril twice a day  B.  Montelukast 10 mg -1 tablet once a day  C.  Symbicort 160 - 2 inhalations two times per day with spacer  2.  Continue to Treat reflux:   A.  Consolidate all caffeine and chocolate consumption  B.  Nexium 40 mg twice a day  C.  ranitidine 300 mg in evening  4.  Continue to Treat headache:   A.  Consolidate all caffeine and chocolate consumption  5.   DISCONTINUE Alendronate  6.  If needed:   A.  Nasal saline spray  B.  OTC antihistamine  C.  Pro Air HFA 2 puffs every 4-6 hours  D. OTC Mucinex DM  7.  Return to clinic in 4 weeks or earlier if problem  Rhonda Bradley is redeveloped her cough.  It should be noted that she redeveloped her cough in the setting of restarting her alendronate.  I am going to keep her off this medication for the next 12 weeks as I suspect that this may be 1 of the triggers giving rise to her respiratory tract inflammation as she does have significant reflux disease and she may be refluxing this material into her airway.  As well, we will give her a trial of Symbicort but her previous attempts at utilizing inhaled steroids and other inhalers have not been particularly that effective in controlling her cough.  We will see what type of response we get with the therapy noted above over the course of the next  4 weeks.  If she continues to cough then we will need to image her airway above and beyond her recent chest x-ray and have ENT examine her [place].  Rhonda Katz, MD Allergy / Immunology [place]

## 2018-03-28 NOTE — Patient Instructions (Addendum)
  1.  Continue to Treat inflammation:   A.  Dymista -1 spray each nostril twice a day  B.  Montelukast 10 mg -1 tablet once a day  C.  Symbicort 160 - 2 inhalations two times per day with spacer  2.  Continue to Treat reflux:   A.  Consolidate all caffeine and chocolate consumption  B.  Nexium 40 mg twice a day  C.  ranitidine 300 mg in evening  4.  Continue to Treat headache:   A.  Consolidate all caffeine and chocolate consumption  5.   DISCONTINUE Alendronate  6.  If needed:   A.  Nasal saline spray  B.  OTC antihistamine  C.  Pro Air HFA 2 puffs every 4-6 hours  D. OTC Mucinex DM  7.  Return to clinic in 4 weeks or earlier if problem

## 2018-03-29 ENCOUNTER — Encounter: Payer: Self-pay | Admitting: Allergy and Immunology

## 2018-04-06 ENCOUNTER — Other Ambulatory Visit: Payer: Self-pay | Admitting: *Deleted

## 2018-04-06 MED ORDER — BUDESONIDE-FORMOTEROL FUMARATE 160-4.5 MCG/ACT IN AERO: INHALATION_SPRAY | RESPIRATORY_TRACT | 4 refills | Status: DC

## 2018-04-18 DIAGNOSIS — E7800 Pure hypercholesterolemia, unspecified: Secondary | ICD-10-CM | POA: Diagnosis not present

## 2018-04-18 DIAGNOSIS — E1169 Type 2 diabetes mellitus with other specified complication: Secondary | ICD-10-CM | POA: Diagnosis not present

## 2018-04-18 DIAGNOSIS — Z0000 Encounter for general adult medical examination without abnormal findings: Secondary | ICD-10-CM | POA: Diagnosis not present

## 2018-04-18 DIAGNOSIS — Z79899 Other long term (current) drug therapy: Secondary | ICD-10-CM | POA: Diagnosis not present

## 2018-04-18 DIAGNOSIS — Z23 Encounter for immunization: Secondary | ICD-10-CM | POA: Diagnosis not present

## 2018-05-02 ENCOUNTER — Encounter: Payer: Self-pay | Admitting: Allergy and Immunology

## 2018-05-02 ENCOUNTER — Ambulatory Visit (INDEPENDENT_AMBULATORY_CARE_PROVIDER_SITE_OTHER): Payer: 59 | Admitting: Allergy and Immunology

## 2018-05-02 VITALS — BP 130/80 | HR 72 | Resp 16

## 2018-05-02 DIAGNOSIS — J3089 Other allergic rhinitis: Secondary | ICD-10-CM

## 2018-05-02 DIAGNOSIS — M818 Other osteoporosis without current pathological fracture: Secondary | ICD-10-CM

## 2018-05-02 DIAGNOSIS — J4540 Moderate persistent asthma, uncomplicated: Secondary | ICD-10-CM

## 2018-05-02 DIAGNOSIS — K219 Gastro-esophageal reflux disease without esophagitis: Secondary | ICD-10-CM | POA: Diagnosis not present

## 2018-05-02 NOTE — Patient Instructions (Addendum)
  1.  Continue to Treat inflammation:   A.  Dymista -1 spray each nostril twice a day  B.  Montelukast 10 mg -1 tablet once a day  C.  Symbicort 160 - 2 inhalations two times per day with spacer  2.  Continue to Treat reflux:   A.  Consolidate all caffeine and chocolate consumption  B.  Nexium 40 mg twice a day  C.  ranitidine 300 mg in evening  4.  Continue to Treat headache:   A.  Consolidate all caffeine and chocolate consumption  5.  Restart alendronate  6.  If needed:   A.  Nasal saline spray  B.  OTC antihistamine  C.  Pro Air HFA 2 puffs every 4-6 hours  D. OTC Mucinex DM  7.  Return to clinic in 12 weeks or earlier if problem

## 2018-05-02 NOTE — Progress Notes (Signed)
Follow-up Note  Referring Provider: Lajean Manes, MD Primary Provider: Lajean Manes, MD Date of Office Visit: 05/02/2018  Subjective:   Rhonda Bradley (DOB: [birth year]) is a 52 y.o. female who returns to the [facility] on 05/02/2018 in re-evaluation of the following:  HPI: Rhonda Bradley returns to this clinic in reevaluation of her persistent respiratory tract symptoms with a combination of asthma and reflux induced respiratory disease and rhinitis as well as the possibility of alendronate induced airway irritation.  During her last visit of 28 March 2018 we had her discontinue alendronate once again as she was having significant problems with her cough and throat.  She has once again cleared up and has very little problem at this point in time.  She has minimal cough.  Occasionally she feels a spot or a dry place in her throat.  She has not been having any problems with her nose.  She has not been having any problems with her asthma.  She has been using [employer] twice a day on a consistent basis because there is some confusion about how to appropriately utilize this medication.  She continues to drink 1 cup of coffee per day.  She has been consistently using her other anti-inflammatory agents for her airway and therapy for reflux  Allergies as of 05/02/2018      Reactions   Erythromycin Nausea And Vomiting   Flagyl [metronidazole] Rash   Penicillins Rash      Medication List      albuterol 108 (90 Base) MCG/ACT inhaler Commonly known as:  PROVENTIL HFA;VENTOLIN HFA Inhale 2 puffs into the lungs every 4 (four) hours as needed for wheezing or shortness of breath.   atorvastatin 40 MG tablet Commonly known as:  LIPITOR Take 40 mg by mouth daily.   Azelastine-Fluticasone 137-50 MCG/ACT Susp Commonly known as:  DYMISTA Place 2 sprays into both nostrils 1 day or 1 dose.   budesonide-formoterol 160-4.5 MCG/ACT inhaler Commonly known as:  SYMBICORT Inhale two  puffs twice daily to prevent cough or wheeze. Rinse mouth after use.   calcium carbonate 1250 (500 Ca) MG tablet Commonly known as:  OS-CAL - dosed in mg of elemental calcium Take 2 tablets (1,000 mg of elemental calcium total) by mouth 2 (two) times daily with a meal.   diphenhydramine-acetaminophen 25-500 MG Tabs tablet Commonly known as:  TYLENOL PM Take 2 tablets by mouth at bedtime as needed (sleep).   esomeprazole 40 MG capsule Commonly known as:  NEXIUM Take 1 capsule (40 mg total) by mouth 2 (two) times daily.   furosemide 40 MG tablet Commonly known as:  LASIX Take 80 mg by mouth daily. Patient takes as needed per patient   levothyroxine 100 MCG tablet Commonly known as:  SYNTHROID, LEVOTHROID TAKE 1 TABLET ONCE DAILY ON AN EMPTY STOMACH 30 MINUTES BEFORE BREAKFAST   liothyronine 5 MCG tablet Commonly known as:  CYTOMEL TAKE 1 TABLET BY MOUTH TWICE A DAY ON AN EMPTY STOMACH   loratadine 10 MG tablet Commonly known as:  CLARITIN Take 10 mg by mouth every morning.   losartan 50 MG tablet Commonly known as:  COZAAR Take 50 mg by mouth daily.   meloxicam 15 MG tablet Commonly known as:  MOBIC 1 TABLET ONCE A DAY AS NEEDED ORALLY 30   metFORMIN 500 MG 24 hr tablet Commonly known as:  GLUCOPHAGE-XR 3 TABLETS WITH EVENING MEAL ONCE A DAY ORALLY 30 DAY(S)   montelukast 10 MG tablet  Commonly known as:  SINGULAIR Take 1 tablet (10 mg total) by mouth at bedtime.   ranitidine 300 MG capsule Commonly known as:  ZANTAC Take 1 capsule (300 mg total) by mouth every evening.   tiZANidine 4 MG tablet Commonly known as:  ZANAFLEX       Past Medical History:  Diagnosis Date  . Anemia    as a child   . Anxiety   . Asthma   . Complication of anesthesia    slow to wake up age 53, but since cholecystectomy and no problems   . Depression   . Family history of anesthesia complication    father slow to wake up   . Fibromyalgia   . GERD (gastroesophageal reflux  disease)   . Gestational diabetes   . Headache   . Hypertension     Past Surgical History:  Procedure Laterality Date  . CESAREAN SECTION     x 3  . CHOLECYSTECTOMY    . THYROIDECTOMY N/A 09/19/2014   Procedure: THYROIDECTOMY;  Surgeon: Armandina Gemma, MD;  Location: [facility];  Service: General;  Laterality: N/A;    Review of systems negative except as noted in HPI / PMHx or noted below:  Review of Systems  Constitutional: Negative.   HENT: Negative.   Eyes: Negative.   Respiratory: Negative.   Cardiovascular: Negative.   Gastrointestinal: Negative.   Genitourinary: Negative.   Musculoskeletal: Negative.   Skin: Negative.   Neurological: Negative.   Endo/Heme/Allergies: Negative.   Psychiatric/Behavioral: Negative.      Objective:   Vitals:   05/02/18 1712  BP: 130/80  Pulse: 72  Resp: 16          Physical Exam  HENT:  Head: Normocephalic.  Right Ear: Tympanic membrane, external ear and ear canal normal.  Left Ear: Tympanic membrane, external ear and ear canal normal.  Nose: Nose normal. No mucosal edema or rhinorrhea.  Mouth/Throat: Uvula is midline, oropharynx is clear and moist and mucous membranes are normal. No oropharyngeal exudate.  Eyes: Conjunctivae are normal.  Neck: Trachea normal. No tracheal tenderness present. No tracheal deviation present. No thyromegaly present.  Cardiovascular: Normal rate, regular rhythm, S1 normal, S2 normal and normal heart sounds.  No murmur heard. Pulmonary/Chest: Breath sounds normal. No stridor. No respiratory distress. She has no wheezes. She has no rales.  Musculoskeletal: She exhibits no edema.  Lymphadenopathy:       Head (right side): No tonsillar adenopathy present.       Head (left side): No tonsillar adenopathy present.    She has no cervical adenopathy.  Neurological: She is alert.  Skin: No rash noted. She is not diaphoretic. No erythema. Nails show no clubbing.    Diagnostics:    Spirometry was performed  and demonstrated an FEV1 of 2.65 at 93 % of predicted.  The patient had an Asthma Control Test with the following results: ACT Total Score: 15.    Assessment and Plan:   1. Asthma, moderate persistent, well-controlled   2. Other allergic rhinitis   3. LPRD (laryngopharyngeal reflux disease)   4. Other osteoporosis without current pathological fracture     1.  Continue to Treat inflammation:   A.  Dymista -1 spray each nostril twice a day  B.  Montelukast 10 mg -1 tablet once a day  C.  Symbicort 160 - 2 inhalations two times per day with spacer  2.  Continue to Treat reflux:   A.  Consolidate all caffeine and chocolate  consumption  B.  Nexium 40 mg twice a day  C.  ranitidine 300 mg in evening  4.  Continue to Treat headache:   A.  Consolidate all caffeine and chocolate consumption  5.  Restart alendronate  6.  If needed:   A.  Nasal saline spray  B.  OTC antihistamine  C.  Pro Air HFA 2 puffs every 4-6 hours  D. OTC Mucinex DM  7.  Return to clinic in 12 weeks or earlier if problem  At this point Jya will restart her alendronate and if she redevelops significant problems with cough and throat irritation in the face of alendronate use then I do not think that this would be a viable option to continue to treat her osteoporosis and she will need to get a substitute therapy.  We will maintain her on a large collection of therapy directed against reflux and respiratory tract inflammation as noted above.  I will see her back in this clinic in 12 weeks or earlier if there is a problem.  If she has a good 12-week interval we can attempt to consolidate some of her treatment.  Allena Katz, MD Allergy / Immunology [place]

## 2018-05-03 ENCOUNTER — Encounter: Payer: Self-pay | Admitting: Allergy and Immunology

## 2018-05-24 ENCOUNTER — Other Ambulatory Visit: Payer: Self-pay | Admitting: Allergy and Immunology

## 2018-07-25 ENCOUNTER — Encounter: Payer: Self-pay | Admitting: Allergy and Immunology

## 2018-07-25 ENCOUNTER — Ambulatory Visit (INDEPENDENT_AMBULATORY_CARE_PROVIDER_SITE_OTHER): Payer: 59 | Admitting: Allergy and Immunology

## 2018-07-25 VITALS — BP 114/86 | HR 101 | Resp 16 | Ht 67.0 in | Wt 213.8 lb

## 2018-07-25 DIAGNOSIS — J3089 Other allergic rhinitis: Secondary | ICD-10-CM

## 2018-07-25 DIAGNOSIS — J4540 Moderate persistent asthma, uncomplicated: Secondary | ICD-10-CM

## 2018-07-25 DIAGNOSIS — K219 Gastro-esophageal reflux disease without esophagitis: Secondary | ICD-10-CM | POA: Diagnosis not present

## 2018-07-25 MED ORDER — MONTELUKAST SODIUM 10 MG PO TABS: 10.0000 mg | ORAL_TABLET | Freq: Every day | ORAL | 5 refills | Status: DC

## 2018-07-25 MED ORDER — BUDESONIDE-FORMOTEROL FUMARATE 160-4.5 MCG/ACT IN AERO: INHALATION_SPRAY | RESPIRATORY_TRACT | 4 refills | Status: DC

## 2018-07-25 MED ORDER — ALBUTEROL SULFATE HFA 108 (90 BASE) MCG/ACT IN AERS: 2.0000 | INHALATION_SPRAY | RESPIRATORY_TRACT | 2 refills | Status: AC | PRN

## 2018-07-25 MED ORDER — AZELASTINE-FLUTICASONE 137-50 MCG/ACT NA SUSP: 2.0000 | NASAL | 5 refills | Status: DC

## 2018-07-25 MED ORDER — RANITIDINE HCL 300 MG PO CAPS: 300.0000 mg | ORAL_CAPSULE | Freq: Every evening | ORAL | 5 refills | Status: DC

## 2018-07-25 MED ORDER — ESOMEPRAZOLE MAGNESIUM 40 MG PO CPDR: 40.0000 mg | DELAYED_RELEASE_CAPSULE | Freq: Two times a day (BID) | ORAL | 1 refills | Status: DC

## 2018-07-25 NOTE — Patient Instructions (Signed)
  1.  Continue to Treat inflammation:   A.  Dymista -1 spray each nostril twice a day  B.  Montelukast 10 mg -1 tablet once a day  C.  Symbicort 160 - 2 inhalations two times per day with spacer  2.  Continue to Treat reflux:   A.  Consolidate all caffeine and chocolate consumption  B.  Nexium 40 mg twice a day  C.  ranitidine 300 mg in evening  4.  Continue to Treat headache:   A.  Consolidate all caffeine and chocolate consumption  5. If needed:   A.  Nasal saline spray  B.  OTC antihistamine  C.  Pro Air HFA 2 puffs every 4-6 hours  D. OTC Mucinex DM  6. Obtain fall flu vaccine  7.  Return to clinic in January 2020 or earlier if problem

## 2018-07-25 NOTE — Progress Notes (Signed)
Follow-up Note  Referring Provider: Lajean Manes, MD Primary Provider: Lajean Manes, MD Date of Office Visit: 07/25/2018  Subjective:   Rhonda Bradley (DOB: [birth year]) is a 52 y.o. female who returns to the [facility] on 07/25/2018 in re-evaluation of the following:  HPI: Rhonda Bradley returns to this clinic in reevaluation of her asthma and allergic rhinitis and laryngopharyngeal reflux.  Her last visit to this clinic was 02 May 2018.  Overall Redina believes that she is doing very well with her airway at this point in time.  She has been consistently using anti-inflammatory agents in the form of Dymista and montelukast and Symbicort and has not required the use of a short acting bronchodilator in a rescue mode.    Likewise, she believes that her reflux is under very good control on her current therapy which includes a proton pump inhibitor twice a day and an H2 receptor blocker.  She still continues to consume caffeine on a daily basis.  Her headaches are about the same.  She was able to restart alendronate without getting her throat all messed up.  Allergies as of 07/25/2018      Reactions   Erythromycin Nausea And Vomiting   Flagyl [metronidazole] Rash   Penicillins Rash      Medication List      albuterol 108 (90 Base) MCG/ACT inhaler Commonly known as:  PROVENTIL HFA;VENTOLIN HFA Inhale 2 puffs into the lungs every 4 (four) hours as needed for wheezing or shortness of breath.   Azelastine-Fluticasone 137-50 MCG/ACT Susp Place 2 sprays into both nostrils 1 day or 1 dose.   budesonide-formoterol 160-4.5 MCG/ACT inhaler Commonly known as:  SYMBICORT Inhale two puffs twice daily to prevent cough or wheeze. Rinse mouth after use.   calcium carbonate 1250 (500 Ca) MG tablet Commonly known as:  OS-CAL - dosed in mg of elemental calcium Take 2 tablets (1,000 mg of elemental calcium total) by mouth 2 (two) times daily with a meal.     diphenhydramine-acetaminophen 25-500 MG Tabs tablet Commonly known as:  TYLENOL PM Take 2 tablets by mouth at bedtime as needed (sleep).   esomeprazole 40 MG capsule Commonly known as:  NEXIUM TAKE 1 CAPSULE BY MOUTH TWICE A DAY   furosemide 40 MG tablet Commonly known as:  LASIX Take 80 mg by mouth daily. Patient takes as needed per patient   levothyroxine 100 MCG tablet Commonly known as:  SYNTHROID, LEVOTHROID TAKE 1 TABLET ONCE DAILY ON AN EMPTY STOMACH 30 MINUTES BEFORE BREAKFAST   liothyronine 5 MCG tablet Commonly known as:  CYTOMEL TAKE 1 TABLET BY MOUTH TWICE A DAY ON AN EMPTY STOMACH   loratadine 10 MG tablet Commonly known as:  CLARITIN Take 10 mg by mouth every morning.   losartan 50 MG tablet Commonly known as:  COZAAR Take 50 mg by mouth daily.   meloxicam 15 MG tablet Commonly known as:  MOBIC 1 TABLET ONCE A DAY AS NEEDED ORALLY 30   metFORMIN 500 MG 24 hr tablet Commonly known as:  GLUCOPHAGE-XR 3 TABLETS WITH EVENING MEAL ONCE A DAY ORALLY 30 DAY(S)   montelukast 10 MG tablet Commonly known as:  SINGULAIR Take 1 tablet (10 mg total) by mouth at bedtime.   ranitidine 300 MG capsule Commonly known as:  ZANTAC Take 1 capsule (300 mg total) by mouth every evening.   rosuvastatin 5 MG tablet Commonly known as:  CRESTOR   tiZANidine 4 MG tablet Commonly known as:  ZANAFLEX       Past Medical History:  Diagnosis Date  . Anemia    as a child   . Anxiety   . Asthma   . Complication of anesthesia    slow to wake up age 25, but since cholecystectomy and no problems   . Depression   . Family history of anesthesia complication    father slow to wake up   . Fibromyalgia   . GERD (gastroesophageal reflux disease)   . Gestational diabetes   . Headache   . Hypertension     Past Surgical History:  Procedure Laterality Date  . CESAREAN SECTION     x 3  . CHOLECYSTECTOMY    . THYROIDECTOMY N/A 09/19/2014   Procedure: THYROIDECTOMY;   Surgeon: Armandina Gemma, MD;  Location: [facility];  Service: General;  Laterality: N/A;    Review of systems negative except as noted in HPI / PMHx or noted below:  Review of Systems  Constitutional: Negative.   HENT: Negative.   Eyes: Negative.   Respiratory: Negative.   Cardiovascular: Negative.   Gastrointestinal: Negative.   Genitourinary: Negative.   Musculoskeletal: Negative.   Skin: Negative.   Neurological: Negative.   Endo/Heme/Allergies: Negative.   Psychiatric/Behavioral: Negative.      Objective:   Vitals:   07/25/18 1700  BP: 114/86  Pulse: (!) 101  Resp: 16  SpO2: 98%   Height: 5\' 7"  (170.2 cm)  Weight: 213 lb 12.8 oz (97 kg)   Physical Exam  HENT:  Head: Normocephalic.  Right Ear: Tympanic membrane, external ear and ear canal normal.  Left Ear: Tympanic membrane, external ear and ear canal normal.  Nose: Nose normal. No mucosal edema or rhinorrhea.  Mouth/Throat: Uvula is midline, oropharynx is clear and moist and mucous membranes are normal. No oropharyngeal exudate.  Eyes: Conjunctivae are normal.  Neck: Trachea normal. No tracheal tenderness present. No tracheal deviation present. No thyromegaly present.  Cardiovascular: Normal rate, regular rhythm, S1 normal, S2 normal and normal heart sounds.  No murmur heard. Pulmonary/Chest: Breath sounds normal. No stridor. No respiratory distress. She has no wheezes. She has no rales.  Musculoskeletal: She exhibits no edema.  Lymphadenopathy:       Head (right side): No tonsillar adenopathy present.       Head (left side): No tonsillar adenopathy present.    She has no cervical adenopathy.  Neurological: She is alert.  Skin: No rash noted. She is not diaphoretic. No erythema. Nails show no clubbing.    Diagnostics:    Spirometry was performed and demonstrated an FEV1 of 2.69 at 89 % of predicted.  The patient had an Asthma Control Test with the following results: ACT Total Score: 21.    Assessment and  Plan:   1. Asthma, moderate persistent, well-controlled   2. Other allergic rhinitis   3. LPRD (laryngopharyngeal reflux disease)     1.  Continue to Treat inflammation:   A.  Dymista -1 spray each nostril twice a day  B.  Montelukast 10 mg -1 tablet once a day  C.  Symbicort 160 - 2 inhalations two times per day with spacer  2.  Continue to Treat reflux:   A.  Consolidate all caffeine and chocolate consumption  B.  Nexium 40 mg twice a day  C.  ranitidine 300 mg in evening  4.  Continue to Treat headache:   A.  Consolidate all caffeine and chocolate consumption  5. If needed:   A.  Nasal saline spray  B.  OTC antihistamine  C.  Pro Air HFA 2 puffs every 4-6 hours  D. OTC Mucinex DM  6. Obtain fall flu vaccine  7.  Return to clinic in January 2020 or earlier if problem  Overall Vianey appears to be doing quite well with anti-inflammatory agents directed against her respiratory tract and therapy directed against reflux.  I did encourage her to modify her daily caffeine consumption as this will no doubt help her reflux and also help her chronic headaches.  She will continue on this plan and I will see her back in this clinic in January 2020 or earlier if there is a problem.  Allena Katz, MD Allergy / Immunology [place]

## 2018-07-26 ENCOUNTER — Encounter: Payer: Self-pay | Admitting: Allergy and Immunology

## 2018-07-28 DIAGNOSIS — M797 Fibromyalgia: Secondary | ICD-10-CM | POA: Diagnosis not present

## 2018-08-01 DIAGNOSIS — R1084 Generalized abdominal pain: Secondary | ICD-10-CM | POA: Diagnosis not present

## 2018-08-01 DIAGNOSIS — R0789 Other chest pain: Secondary | ICD-10-CM | POA: Diagnosis not present

## 2018-08-01 DIAGNOSIS — K219 Gastro-esophageal reflux disease without esophagitis: Secondary | ICD-10-CM | POA: Diagnosis not present

## 2018-08-01 DIAGNOSIS — I10 Essential (primary) hypertension: Secondary | ICD-10-CM | POA: Diagnosis not present

## 2018-08-10 ENCOUNTER — Other Ambulatory Visit: Payer: Self-pay | Admitting: Geriatric Medicine

## 2018-08-10 DIAGNOSIS — R1084 Generalized abdominal pain: Secondary | ICD-10-CM

## 2018-08-10 IMAGING — US US SOFT TISSUE HEAD/NECK
1 series · 14 of 14 positions shown · non-contrast
Comparison: None.

CLINICAL DATA: Palpable nontender region x1 month

EXAM:
ULTRASOUND OF HEAD/NECK SOFT TISSUES
TECHNIQUE: Ultrasound examination of the head and neck soft tissues was
performed in the area of clinical concern.

[Series 1: us soft tissue head/neck · 0.05mm/px · 14 of 14 slices shown]
[im 1/14]
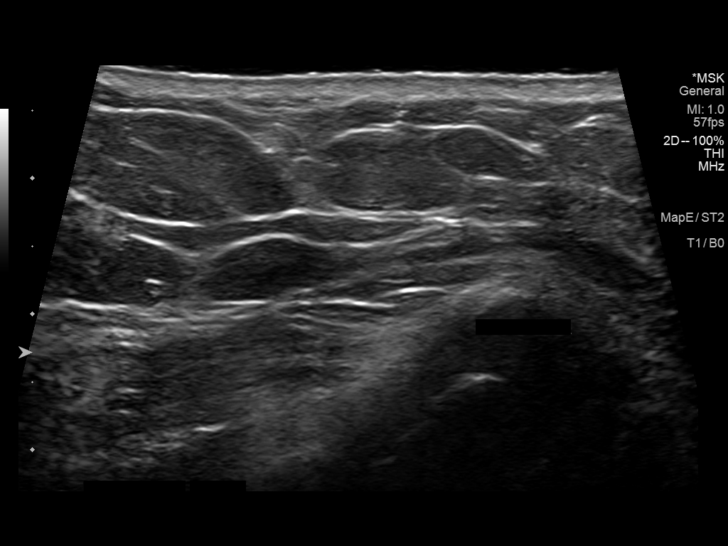
[im 2/14]
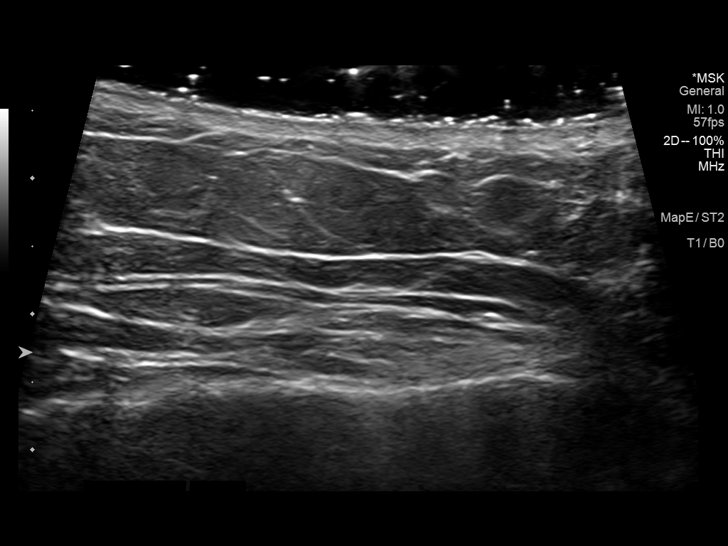
[im 3/14]
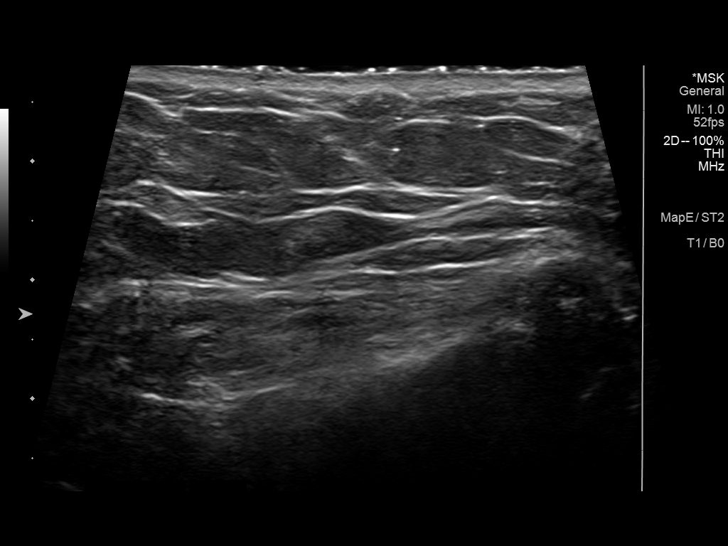
[im 4/14]
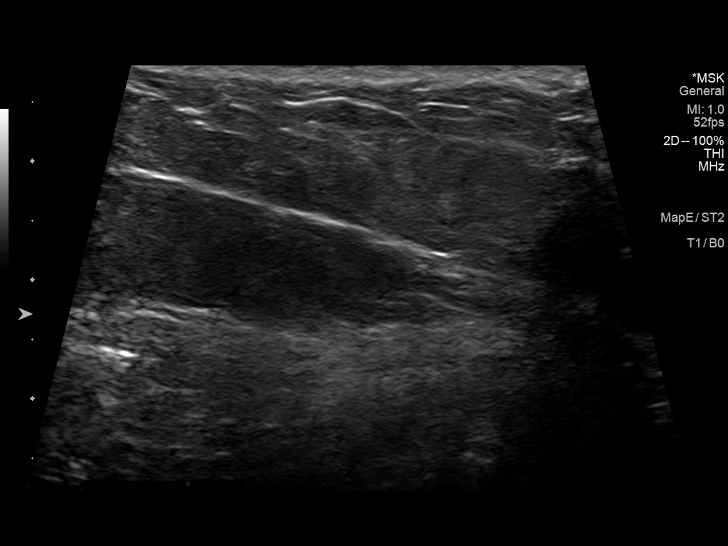
[im 5/14]
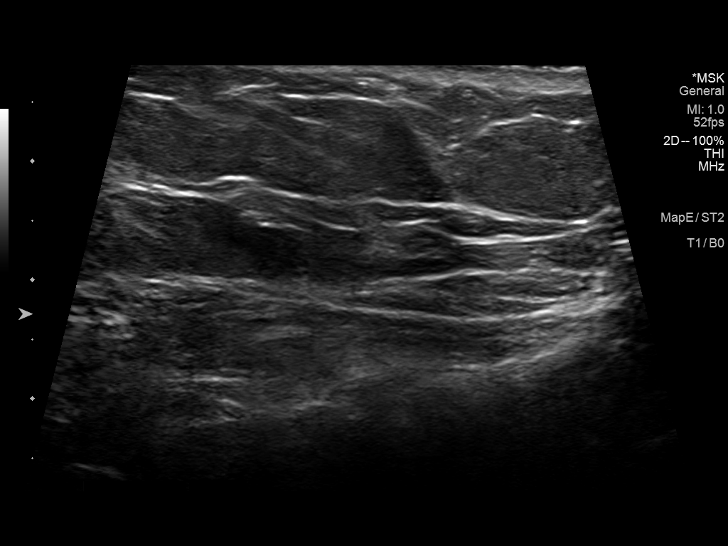
[im 6/14]
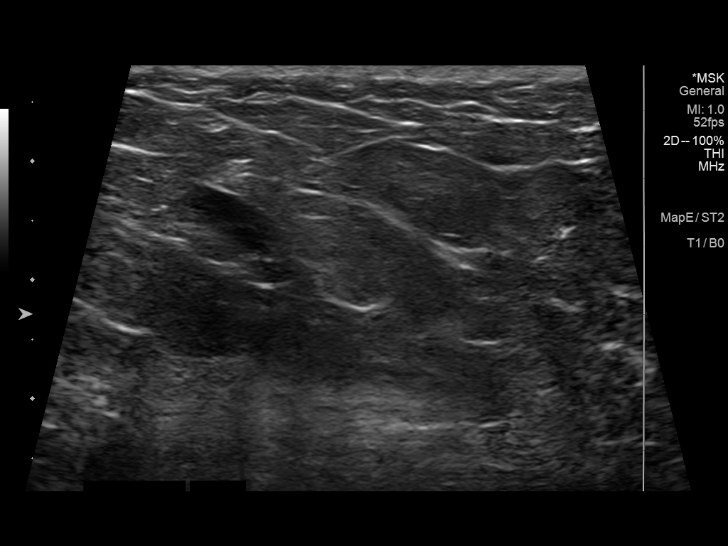
[im 7/14]
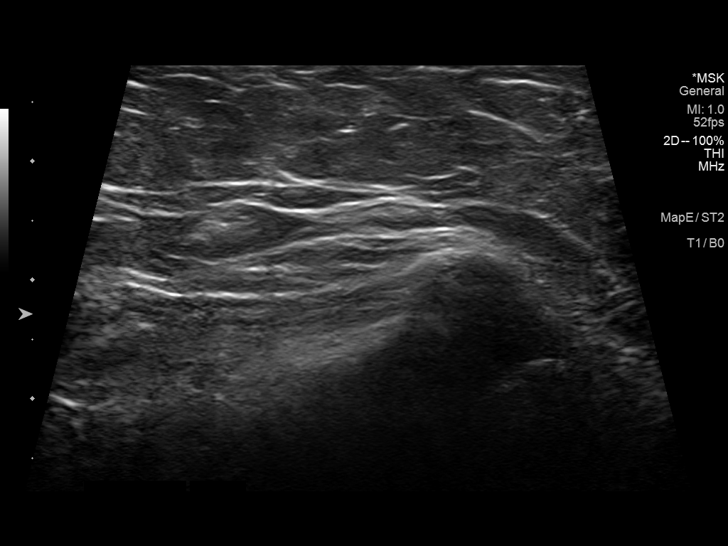
[im 8/14]
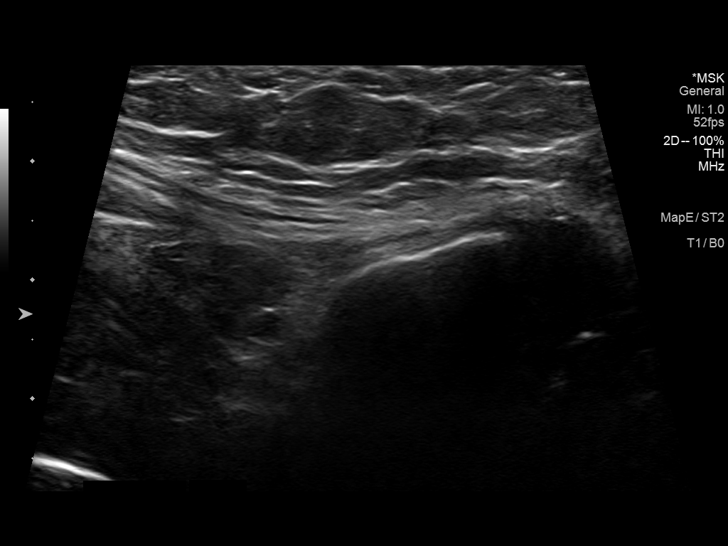
[im 9/14]
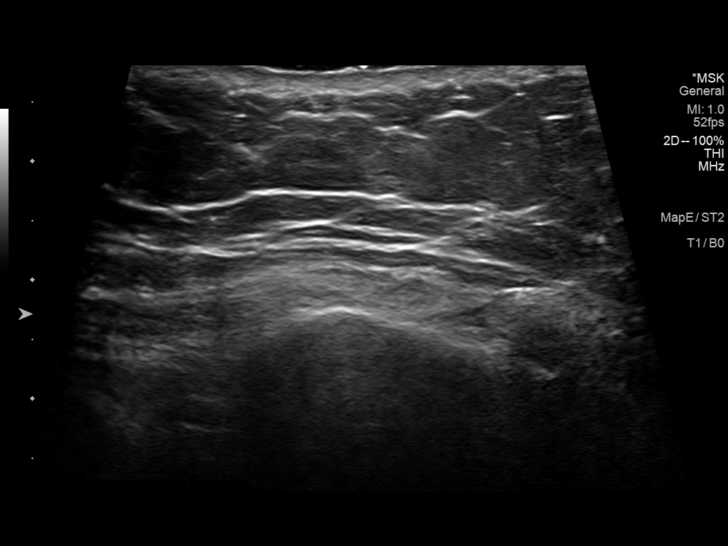
[im 10/14]
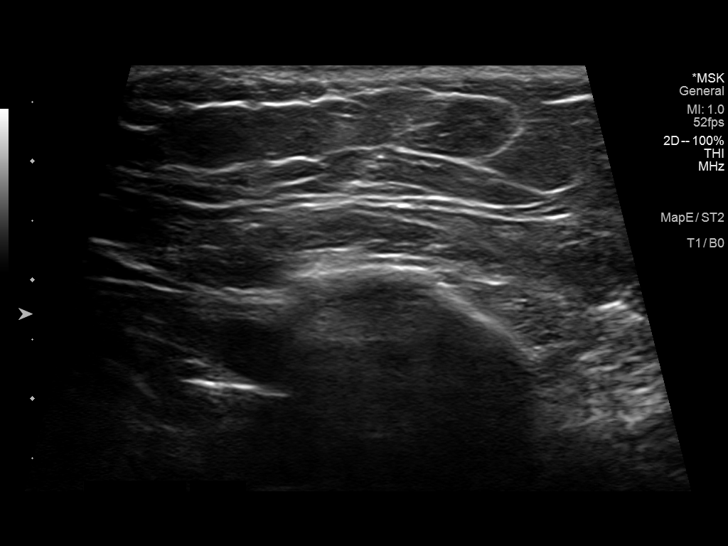
[im 11/14]
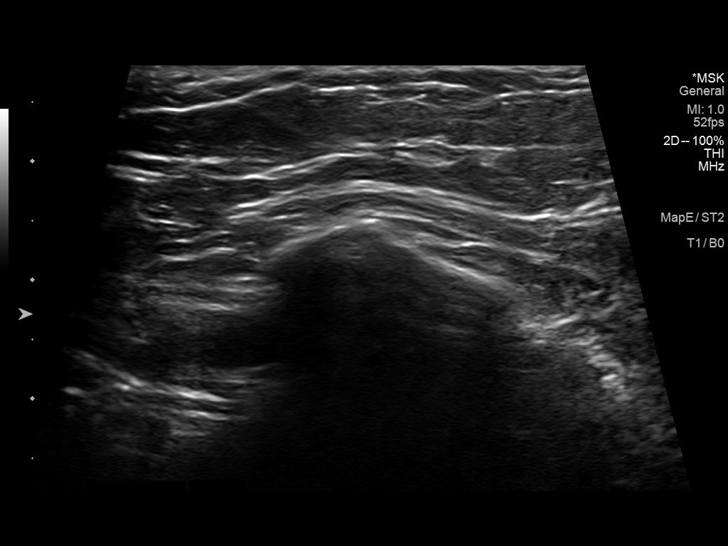
[im 12/14]
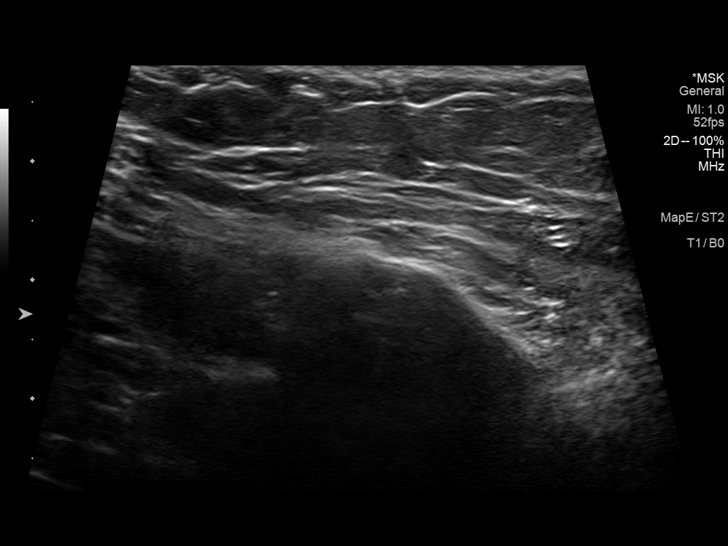
[im 13/14]
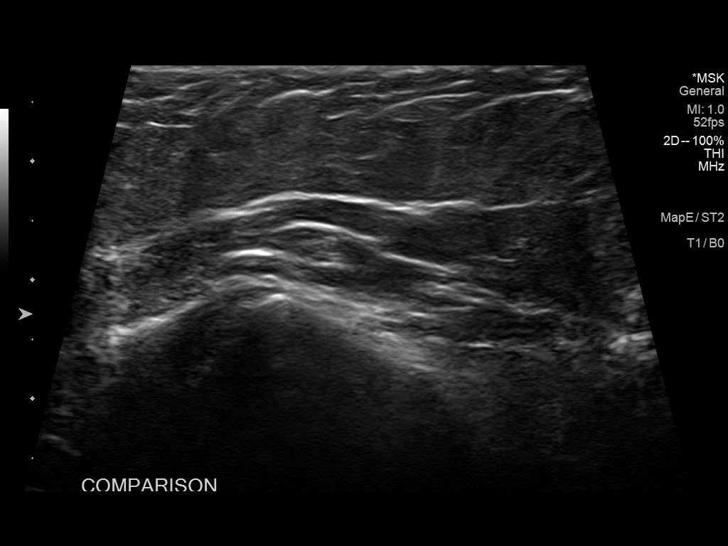
[im 14/14]
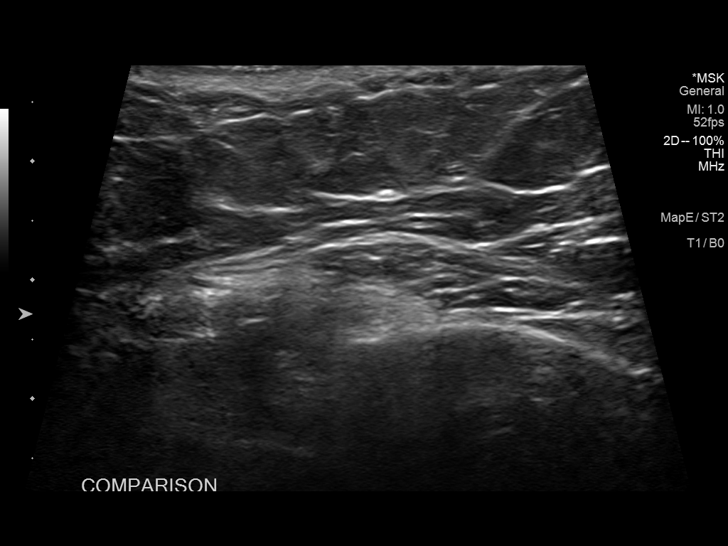

[14 of 14 positions shown; findings below may reference images not displayed]

FINDINGS: No mass, cyst, abscess, aneurysm, adenopathy, or other pathologic
findings in the region of concern.
IMPRESSION: No pathologic findings on ultrasound.

## 2018-08-17 IMAGING — DX DG CHEST 2V
2 series · 2 of 2 positions shown · non-contrast
Comparison: 03/29/2017.

CLINICAL DATA: Productive cough for the past 4 months.

EXAM:
CHEST  2 VIEW

[dg chest 2 view (1 of 2)]
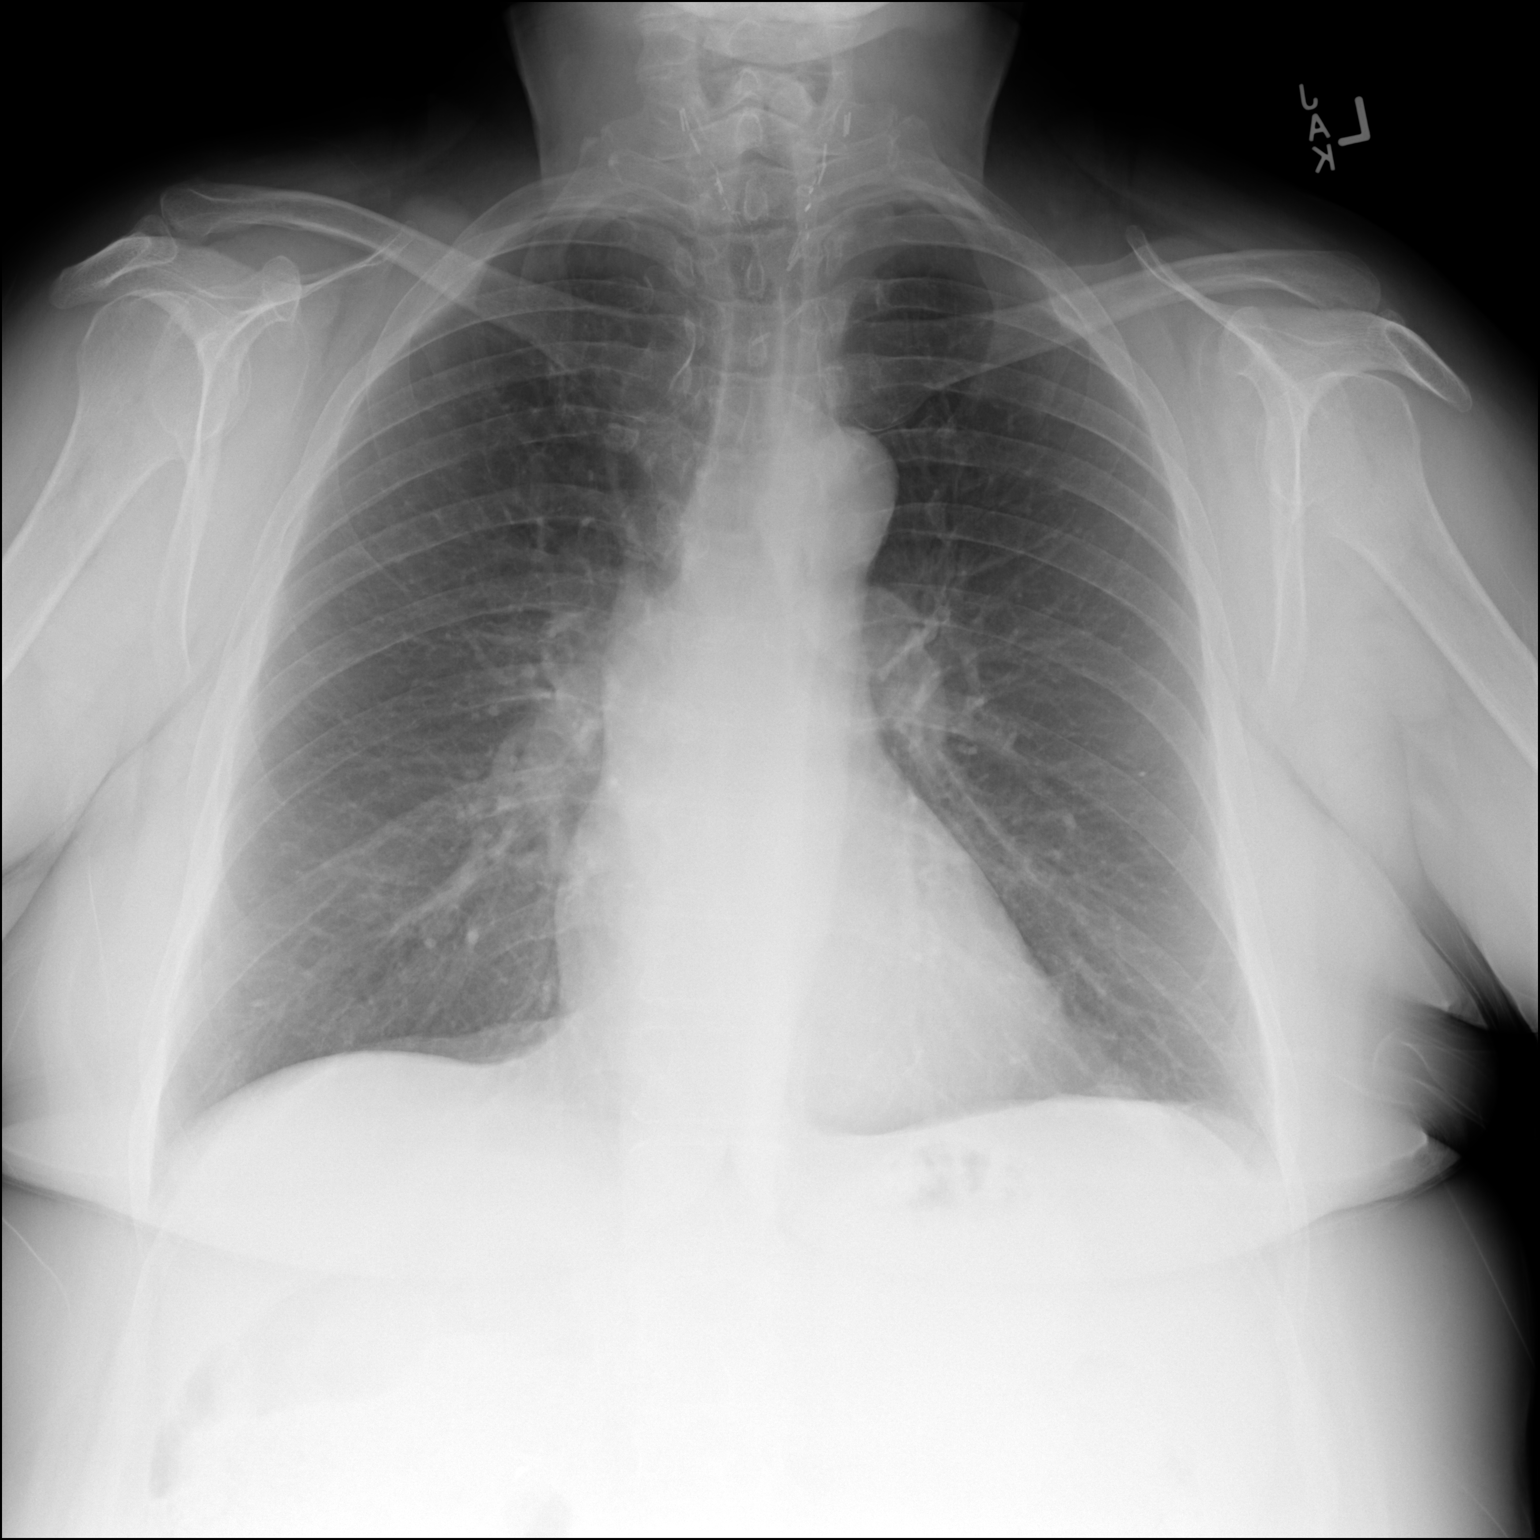

[dg chest 2 view (2 of 2)]
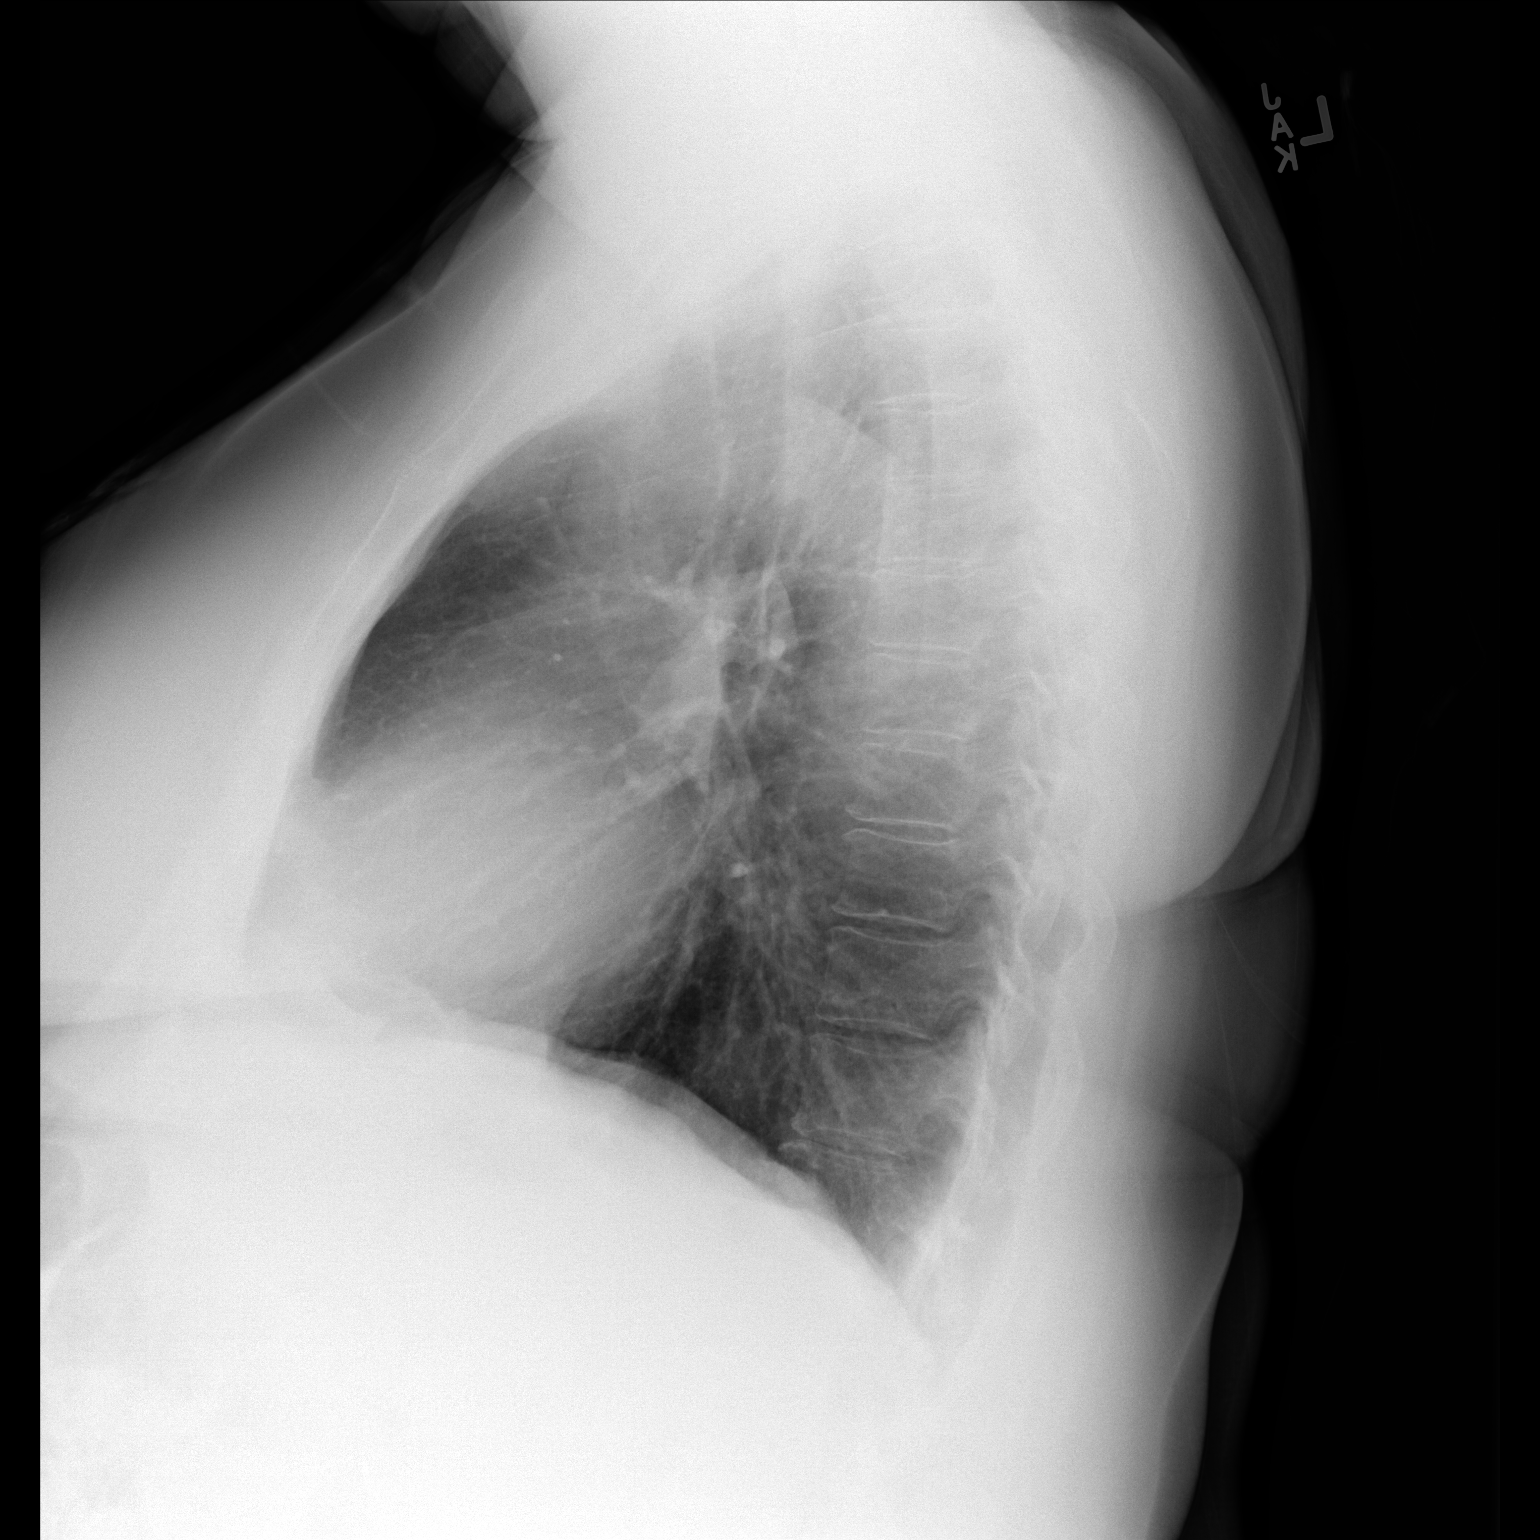

[2 of 2 positions shown; findings below may reference images not displayed]

FINDINGS: Normal sized heart. Clear lungs. Mild thoracic spine degenerative
changes.
IMPRESSION: No acute abnormality.

## 2018-08-17 IMAGING — DX DG CLAVICLE*R*
2 series · 2 of 2 positions shown · non-contrast
Comparison: None.

CLINICAL DATA: Proximal right clavicle pain and swelling for the
past month. No known injury.

EXAM:
RIGHT CLAVICLE - 2+ VIEWS

[dg clavicle right (1 of 2)]
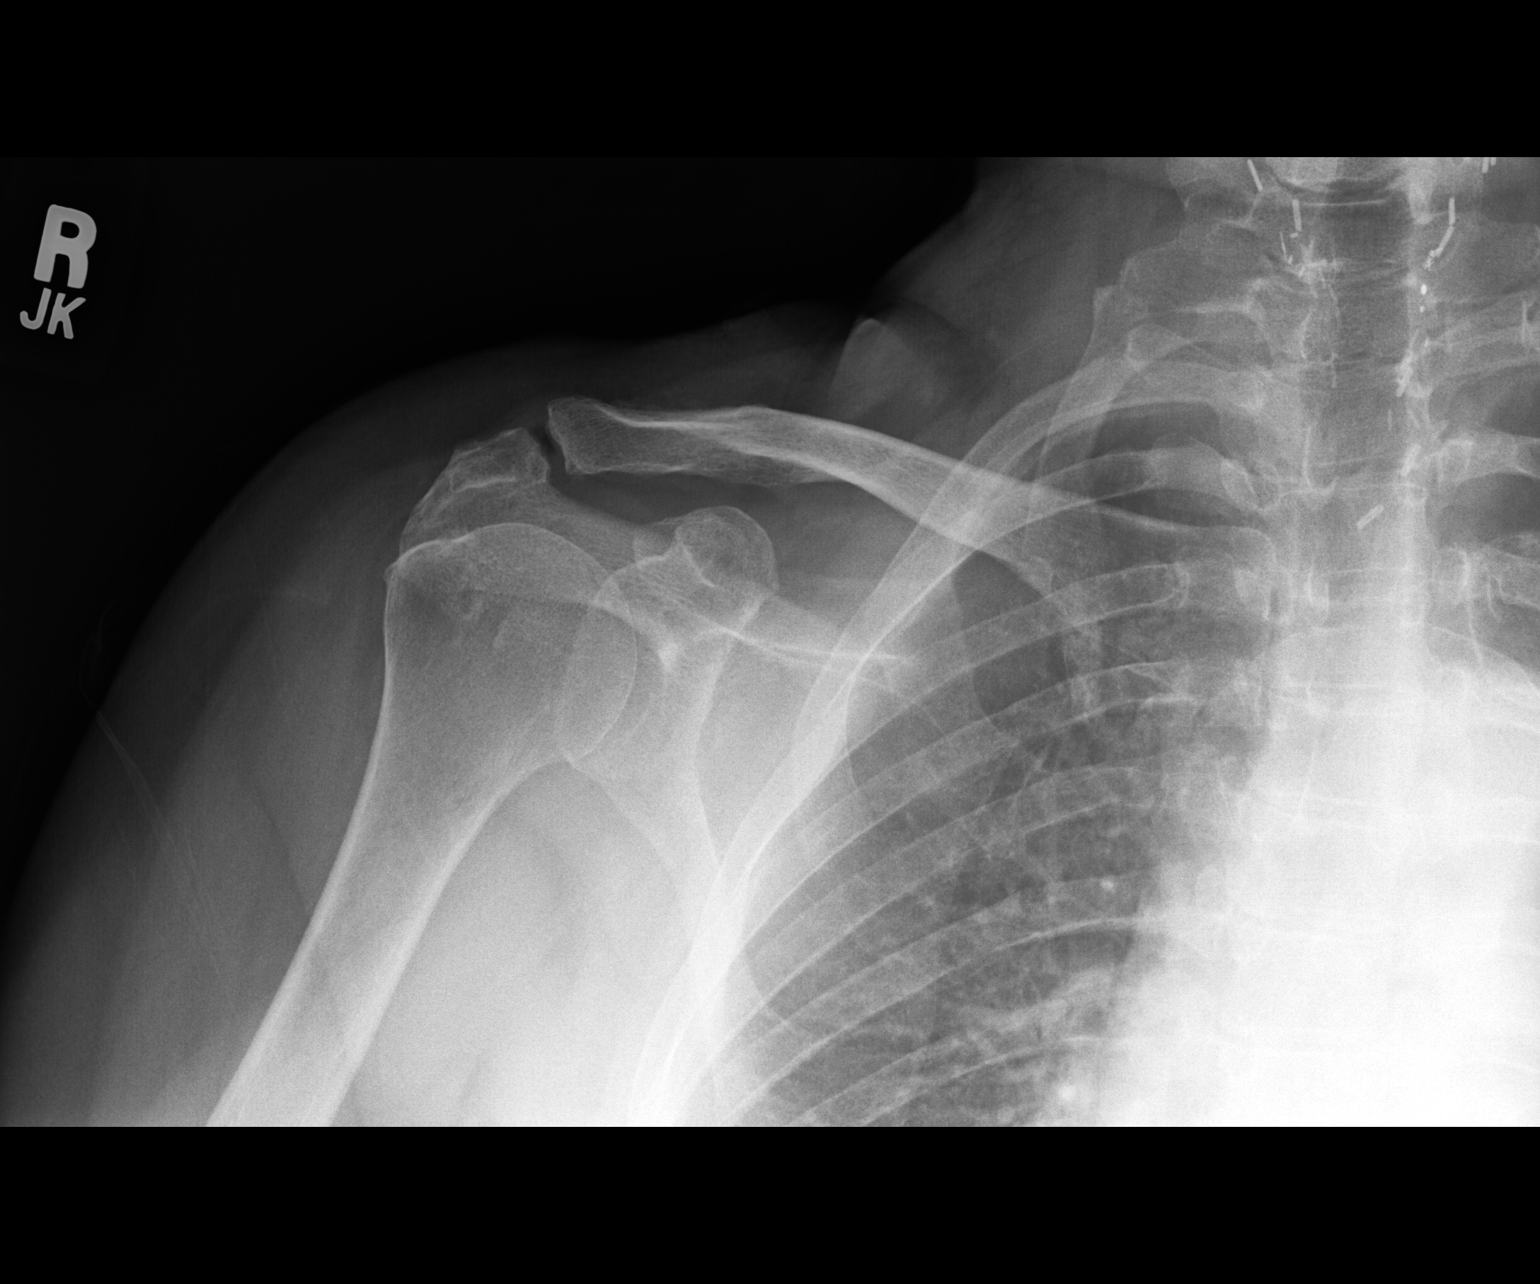

[dg clavicle right (2 of 2)]
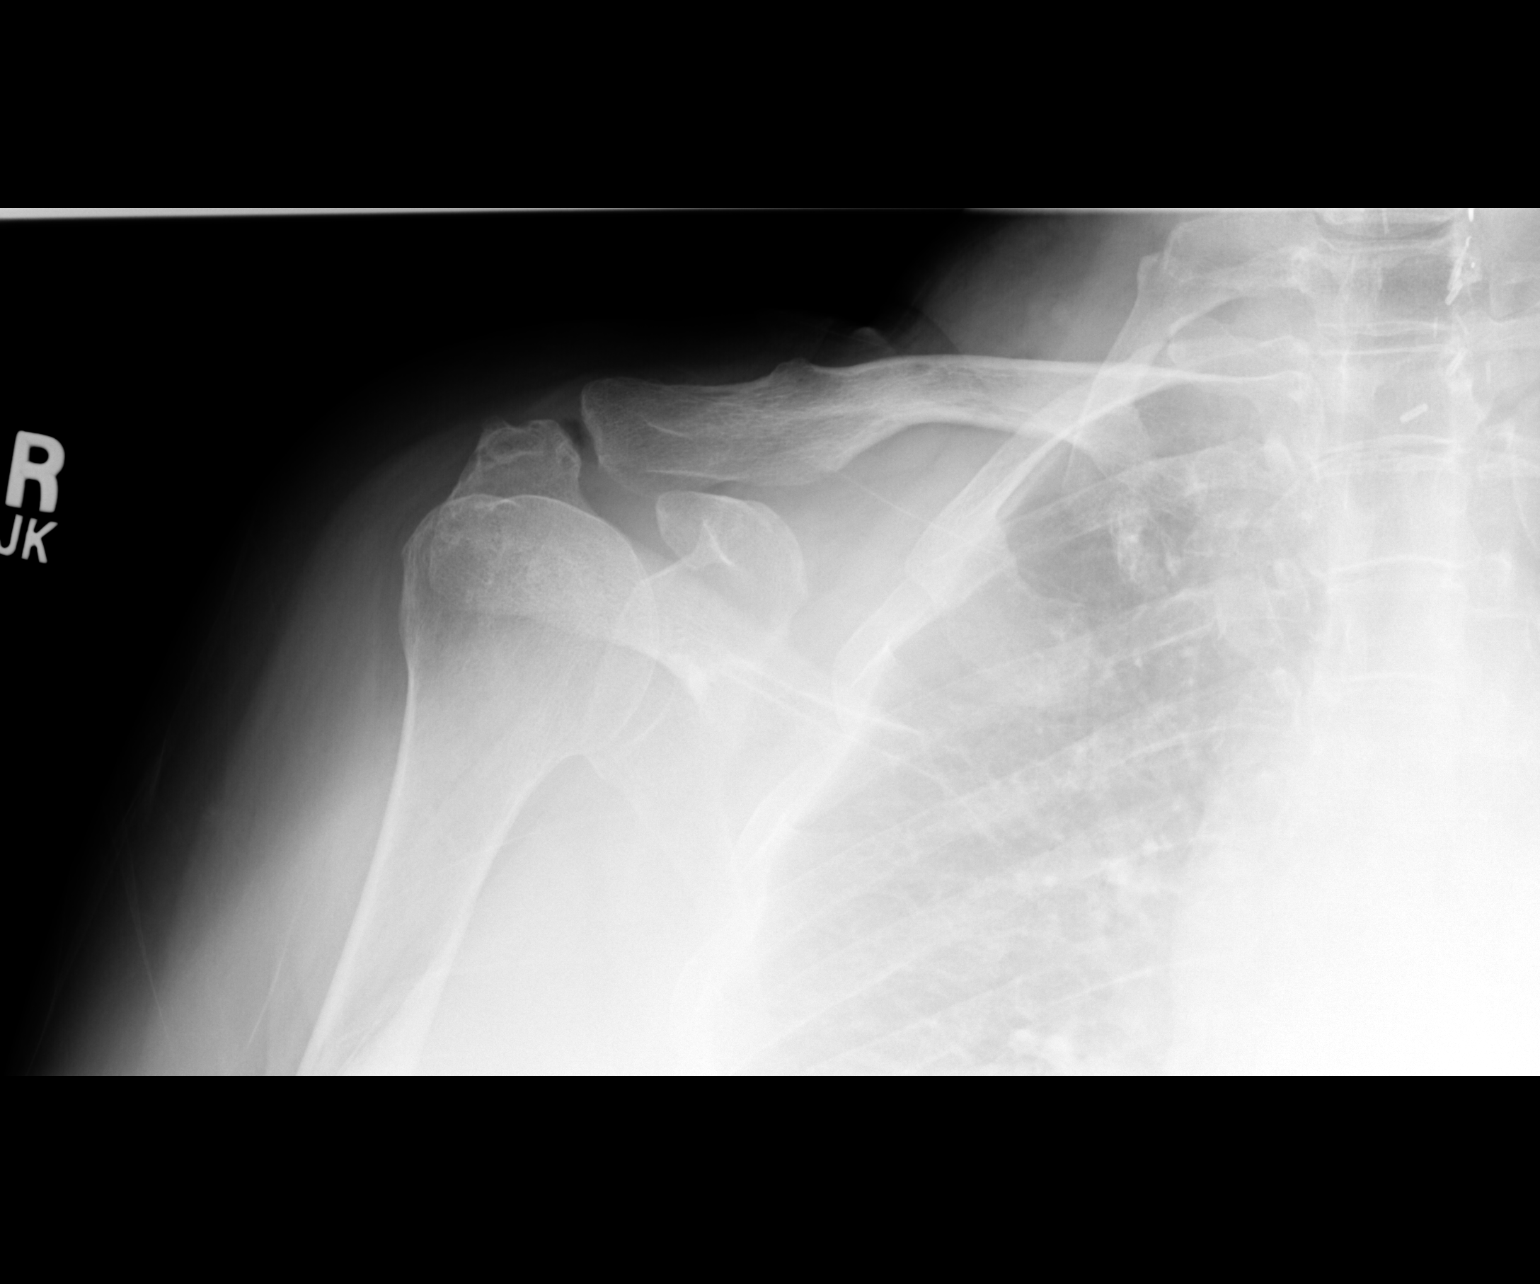

[2 of 2 positions shown; findings below may reference images not displayed]

FINDINGS: There is no evidence of fracture or other focal bone lesions. Soft
tissues are unremarkable.
IMPRESSION: Normal appearing clavicle.

## 2018-08-18 ENCOUNTER — Ambulatory Visit
Admission: RE | Admit: 2018-08-18 | Discharge: 2018-08-18 | Disposition: A | Discharge status: Home / Self Care | Payer: 59 | Source: Ambulatory Visit | Attending: Geriatric Medicine | Admitting: Geriatric Medicine

## 2018-08-18 DIAGNOSIS — R1084 Generalized abdominal pain: Secondary | ICD-10-CM

## 2018-08-18 DIAGNOSIS — K760 Fatty (change of) liver, not elsewhere classified: Secondary | ICD-10-CM | POA: Diagnosis not present

## 2018-08-30 DIAGNOSIS — E1169 Type 2 diabetes mellitus with other specified complication: Secondary | ICD-10-CM | POA: Diagnosis not present

## 2018-08-30 DIAGNOSIS — J019 Acute sinusitis, unspecified: Secondary | ICD-10-CM | POA: Diagnosis not present

## 2018-08-30 DIAGNOSIS — J209 Acute bronchitis, unspecified: Secondary | ICD-10-CM | POA: Diagnosis not present

## 2018-08-30 DIAGNOSIS — M545 Low back pain: Secondary | ICD-10-CM | POA: Diagnosis not present

## 2018-08-31 ENCOUNTER — Telehealth: Payer: Self-pay | Admitting: Allergy and Immunology

## 2018-08-31 NOTE — Telephone Encounter (Signed)
Called and informed patient of Dr. Bruna Potter recommendation. Patient stated understanding and will call if she has any problems.

## 2018-08-31 NOTE — Telephone Encounter (Signed)
Please inform patient that she can continue on the previously prescribed medication plan from our clinic and utilize antibiotic and if there is a problem in the face of the treatment then she can contact us for further evaluation and treatment.

## 2018-08-31 NOTE — Telephone Encounter (Signed)
Rhonda Bradley called in and states she has had some nasal congestion and some respiratory issues.  She was seen by her PCP yesterday and he diagnosed her with bronchitis.  He started her on an antibiotic yesterday, which she didn't know the name of, and he suggested she ask Dr. Neldon Mc if she needed to increase her nasal sprays and/or inhalers.  Please advise.

## 2018-09-04 DIAGNOSIS — E1165 Type 2 diabetes mellitus with hyperglycemia: Secondary | ICD-10-CM | POA: Diagnosis not present

## 2018-09-05 DIAGNOSIS — M542 Cervicalgia: Secondary | ICD-10-CM | POA: Diagnosis not present

## 2018-09-05 DIAGNOSIS — E1165 Type 2 diabetes mellitus with hyperglycemia: Secondary | ICD-10-CM | POA: Diagnosis not present

## 2018-09-20 DIAGNOSIS — M542 Cervicalgia: Secondary | ICD-10-CM | POA: Diagnosis not present

## 2018-09-28 DIAGNOSIS — M542 Cervicalgia: Secondary | ICD-10-CM | POA: Diagnosis not present

## 2018-10-02 DIAGNOSIS — E119 Type 2 diabetes mellitus without complications: Secondary | ICD-10-CM | POA: Diagnosis not present

## 2018-10-04 DIAGNOSIS — M542 Cervicalgia: Secondary | ICD-10-CM | POA: Diagnosis not present

## 2018-10-16 ENCOUNTER — Other Ambulatory Visit: Payer: Self-pay | Admitting: Geriatric Medicine

## 2018-10-16 DIAGNOSIS — Z1231 Encounter for screening mammogram for malignant neoplasm of breast: Secondary | ICD-10-CM

## 2018-10-17 DIAGNOSIS — M542 Cervicalgia: Secondary | ICD-10-CM | POA: Diagnosis not present

## 2018-10-18 DIAGNOSIS — E1169 Type 2 diabetes mellitus with other specified complication: Secondary | ICD-10-CM | POA: Diagnosis not present

## 2018-10-18 DIAGNOSIS — J4540 Moderate persistent asthma, uncomplicated: Secondary | ICD-10-CM | POA: Diagnosis not present

## 2018-10-18 DIAGNOSIS — I10 Essential (primary) hypertension: Secondary | ICD-10-CM | POA: Diagnosis not present

## 2018-10-19 DIAGNOSIS — M542 Cervicalgia: Secondary | ICD-10-CM | POA: Diagnosis not present

## 2018-10-25 DIAGNOSIS — M542 Cervicalgia: Secondary | ICD-10-CM | POA: Diagnosis not present

## 2018-11-07 DIAGNOSIS — E1169 Type 2 diabetes mellitus with other specified complication: Secondary | ICD-10-CM | POA: Diagnosis not present

## 2018-11-27 ENCOUNTER — Ambulatory Visit
Admission: RE | Admit: 2018-11-27 | Discharge: 2018-11-27 | Disposition: A | Discharge status: Home / Self Care | Payer: 59 | Source: Ambulatory Visit | Attending: Geriatric Medicine | Admitting: Geriatric Medicine

## 2018-11-27 DIAGNOSIS — Z1231 Encounter for screening mammogram for malignant neoplasm of breast: Secondary | ICD-10-CM

## 2018-12-01 DIAGNOSIS — N941 Unspecified dyspareunia: Secondary | ICD-10-CM | POA: Diagnosis not present

## 2018-12-01 DIAGNOSIS — R102 Pelvic and perineal pain: Secondary | ICD-10-CM | POA: Diagnosis not present

## 2018-12-01 DIAGNOSIS — N760 Acute vaginitis: Secondary | ICD-10-CM | POA: Diagnosis not present

## 2018-12-11 DIAGNOSIS — N941 Unspecified dyspareunia: Secondary | ICD-10-CM | POA: Diagnosis not present

## 2018-12-11 DIAGNOSIS — R102 Pelvic and perineal pain: Secondary | ICD-10-CM | POA: Diagnosis not present

## 2018-12-14 ENCOUNTER — Other Ambulatory Visit: Payer: Self-pay | Admitting: Allergy and Immunology

## 2018-12-14 NOTE — Telephone Encounter (Signed)
Courtesy refill  

## 2018-12-25 DIAGNOSIS — M542 Cervicalgia: Secondary | ICD-10-CM | POA: Diagnosis not present

## 2018-12-28 DIAGNOSIS — M542 Cervicalgia: Secondary | ICD-10-CM | POA: Diagnosis not present

## 2019-01-11 DIAGNOSIS — M810 Age-related osteoporosis without current pathological fracture: Secondary | ICD-10-CM | POA: Diagnosis not present

## 2019-01-11 DIAGNOSIS — R109 Unspecified abdominal pain: Secondary | ICD-10-CM | POA: Diagnosis not present

## 2019-01-11 DIAGNOSIS — N898 Other specified noninflammatory disorders of vagina: Secondary | ICD-10-CM | POA: Diagnosis not present

## 2019-01-12 ENCOUNTER — Other Ambulatory Visit: Payer: Self-pay | Admitting: Nurse Practitioner

## 2019-01-15 DIAGNOSIS — D225 Melanocytic nevi of trunk: Secondary | ICD-10-CM | POA: Diagnosis not present

## 2019-01-15 DIAGNOSIS — L814 Other melanin hyperpigmentation: Secondary | ICD-10-CM | POA: Diagnosis not present

## 2019-01-15 DIAGNOSIS — M542 Cervicalgia: Secondary | ICD-10-CM | POA: Diagnosis not present

## 2019-01-15 DIAGNOSIS — L821 Other seborrheic keratosis: Secondary | ICD-10-CM | POA: Diagnosis not present

## 2019-01-16 ENCOUNTER — Other Ambulatory Visit: Payer: Self-pay | Admitting: Nurse Practitioner

## 2019-01-16 DIAGNOSIS — M810 Age-related osteoporosis without current pathological fracture: Secondary | ICD-10-CM

## 2019-01-29 DIAGNOSIS — M542 Cervicalgia: Secondary | ICD-10-CM | POA: Diagnosis not present

## 2019-01-30 ENCOUNTER — Ambulatory Visit: Payer: 59 | Admitting: Allergy and Immunology

## 2019-01-30 VITALS — BP 130/76 | HR 98 | Resp 18

## 2019-01-30 DIAGNOSIS — J4540 Moderate persistent asthma, uncomplicated: Secondary | ICD-10-CM

## 2019-01-30 DIAGNOSIS — K219 Gastro-esophageal reflux disease without esophagitis: Secondary | ICD-10-CM

## 2019-01-30 DIAGNOSIS — B999 Unspecified infectious disease: Secondary | ICD-10-CM | POA: Diagnosis not present

## 2019-01-30 DIAGNOSIS — J3089 Other allergic rhinitis: Secondary | ICD-10-CM

## 2019-01-30 MED ORDER — MONTELUKAST SODIUM 10 MG PO TABS: 10.0000 mg | ORAL_TABLET | Freq: Every day | ORAL | 5 refills | Status: AC

## 2019-01-30 NOTE — Progress Notes (Signed)
Follow-up Note  Referring Provider: Lajean Manes, MD Primary Provider: Lajean Manes, MD Date of Office Visit: 01/30/2019  Subjective:   Rhonda Bradley (DOB: 16-May-1966) is a 53 y.o. female who returns to the [facility] on 01/30/2019 in re-evaluation of the following:  HPI: Rhonda Bradley returns to this clinic in reevaluation of her asthma and allergic rhinitis and LPR and headache.  I last saw her in this clinic on 25 July 2018.  Rhonda Bradley informs me that she required 3 antibiotics for episodes of coughing, nasal congestion, with laryngitis, without fever.  Usually these episodes will last a week or so.  She uses a short acting bronchodilator during this timeframe.  It does not sound like she has ugly nasal discharge or ugly sputum production or chest pain associated with these episodes.  If she is not in one of these "sinus" episode she does great.  She has very little airway issue.  She does not have a tremendous amount of nasal congestion or sneezing and she does not have a whole bunch of throat clearing or drainage although she occasionally does get raspy voice and she does not really have that much coughing and does not need to use a short acting bronchodilator.  She thinks that her reflux is under pretty good control at this point in time while using her proton pump inhibitor.  She discontinued her ranitidine.  She drinks 2 coffees per day.  She thinks her headaches are still occurring on almost a daily basis.  She is undergoing dry needling for her neck and shoulder for bulging disc and arthritis which is helping this issue.  Allergies as of 01/30/2019      Reactions   Erythromycin Nausea And Vomiting   Flagyl [metronidazole] Rash   Penicillins Rash      Medication List      albuterol 108 (90 Base) MCG/ACT inhaler Commonly known as:  PROVENTIL HFA;VENTOLIN HFA Inhale 2 puffs into the lungs every 4 (four) hours as needed for wheezing or shortness of  breath.   Azelastine-Fluticasone 137-50 MCG/ACT Susp Commonly known as:  DYMISTA Place 2 sprays into both nostrils 1 day or 1 dose.   budesonide-formoterol 160-4.5 MCG/ACT inhaler Commonly known as:  SYMBICORT INHALE TWO PUFFS TWICE DAILY TO PREVENT COUGH OR WHEEZE. RINSE MOUTH AFTER USE.   calcium carbonate 1250 (500 Ca) MG tablet Commonly known as:  OS-CAL - dosed in mg of elemental calcium Take 2 tablets (1,000 mg of elemental calcium total) by mouth 2 (two) times daily with a meal.   diphenhydramine-acetaminophen 25-500 MG Tabs tablet Commonly known as:  TYLENOL PM Take 2 tablets by mouth at bedtime as needed (sleep).   esomeprazole 40 MG capsule Commonly known as:  NEXIUM Take 1 capsule (40 mg total) by mouth 2 (two) times daily.   furosemide 40 MG tablet Commonly known as:  LASIX Take 80 mg by mouth daily. Patient takes as needed per patient   levothyroxine 100 MCG tablet Commonly known as:  SYNTHROID, LEVOTHROID TAKE 1 TABLET ONCE DAILY ON AN EMPTY STOMACH 30 MINUTES BEFORE BREAKFAST   liothyronine 5 MCG tablet Commonly known as:  CYTOMEL TAKE 1 TABLET BY MOUTH TWICE A DAY ON AN EMPTY STOMACH   loratadine 10 MG tablet Commonly known as:  CLARITIN Take 10 mg by mouth every morning.   losartan 50 MG tablet Commonly known as:  COZAAR Take 50 mg by mouth daily.   meloxicam 15 MG tablet Commonly known as:  MOBIC 1 TABLET ONCE A DAY AS NEEDED ORALLY 30   metFORMIN 500 MG 24 hr tablet Commonly known as:  GLUCOPHAGE-XR 3 TABLETS WITH EVENING MEAL ONCE A DAY ORALLY 30 DAY(S)   montelukast 10 MG tablet Commonly known as:  SINGULAIR Take 1 tablet (10 mg total) by mouth at bedtime.   ranitidine 300 MG capsule Commonly known as:  ZANTAC Take 1 capsule (300 mg total) by mouth every evening.   rosuvastatin 5 MG tablet Commonly known as:  CRESTOR   tiZANidine 4 MG tablet Commonly known as:  ZANAFLEX       Past Medical History:  Diagnosis Date  . Anemia     as a child   . Anxiety   . Asthma   . Complication of anesthesia    slow to wake up age 83, but since cholecystectomy and no problems   . Depression   . Family history of anesthesia complication    father slow to wake up   . Fibromyalgia   . GERD (gastroesophageal reflux disease)   . Gestational diabetes   . Headache   . Hypertension     Past Surgical History:  Procedure Laterality Date  . BREAST BIOPSY    . BREAST EXCISIONAL BIOPSY    . CESAREAN SECTION     x 3  . CHOLECYSTECTOMY    . THYROIDECTOMY N/A 09/19/2014   Procedure: THYROIDECTOMY;  Surgeon: Armandina Gemma, MD;  Location: [facility];  Service: General;  Laterality: N/A;    Review of systems negative except as noted in HPI / PMHx or noted below:  Review of Systems  Constitutional: Negative.   HENT: Negative.   Eyes: Negative.   Respiratory: Negative.   Cardiovascular: Negative.   Gastrointestinal: Negative.   Genitourinary: Negative.   Musculoskeletal: Negative.   Skin: Negative.   Neurological: Negative.   Endo/Heme/Allergies: Negative.   Psychiatric/Behavioral: Negative.      Objective:   Vitals:   01/30/19 1738  BP: 130/76  Pulse: 98  Resp: 18  SpO2: 97%          Physical Exam Constitutional:      Appearance: She is not diaphoretic.  HENT:     Head: Normocephalic.     Right Ear: Tympanic membrane, ear canal and external ear normal.     Left Ear: Tympanic membrane, ear canal and external ear normal.     Nose: Nose normal. No mucosal edema or rhinorrhea.     Mouth/Throat:     Pharynx: Uvula midline. No oropharyngeal exudate.  Eyes:     Conjunctiva/sclera: Conjunctivae normal.  Neck:     Thyroid: No thyromegaly.     Trachea: Trachea normal. No tracheal tenderness or tracheal deviation.  Cardiovascular:     Rate and Rhythm: Normal rate and regular rhythm.     Heart sounds: Normal heart sounds, S1 normal and S2 normal. No murmur.  Pulmonary:     Effort: No respiratory distress.     Breath  sounds: Normal breath sounds. No stridor. No wheezing or rales.  Lymphadenopathy:     Head:     Right side of head: No tonsillar adenopathy.     Left side of head: No tonsillar adenopathy.     Cervical: No cervical adenopathy.  Skin:    Findings: No erythema or rash.     Nails: There is no clubbing.   Neurological:     Mental Status: She is alert.     Diagnostics:    Spirometry was performed  and demonstrated an FEV1 of 2.55 at 74 % of predicted.  Assessment and Plan:   1. Asthma, moderate persistent, well-controlled   2. Other allergic rhinitis   3. LPRD (laryngopharyngeal reflux disease)   4. Recurrent infections     1.  Continue to Treat inflammation:   A.  Dymista -1 spray each nostril twice a day  B.  Montelukast 10 mg -1 tablet once a day  C.  Symbicort 160 - 2 inhalations two times per day with spacer  2.  Continue to Treat reflux:   A.  Consolidate all caffeine and chocolate consumption  B.  Nexium 40 mg twice a day  3.  Continue to Treat headache:   A.  Consolidate all caffeine and chocolate consumption  4. If needed:   A.  Nasal saline spray  B.  OTC antihistamine  C.  Pro Air HFA 2 puffs every 4-6 hours  D. OTC Mucinex DM  5.  Blood - CBC w/diff, IgA/G/M, Area 2 profile.  Further evaluation?  6.  Return to clinic in summer 2020 or earlier if problem  Ilyse appears to still have recurrent episodes of "sinusitis" that required the administration of antibiotics.  She has had 3 of these episodes over the course of the past 6 months.  I will obtain some blood test looking at the ability of her immune system to mount a B-cell immune response and also to further investigate for possible atopic disease.  She will maintain treatment with  anti-inflammatory agents for her airway and therapy directed against reflux at this point.  I asked her to consider further consolidating her caffeine use as this is probably contributing to some of her reflux and also her  headaches.  I will contact her with the results of her blood test once they are available for review.  I will see her back in this clinic in summer 2020 or earlier if there is a problem.  Allena Katz, MD Allergy / Immunology [place]

## 2019-01-30 NOTE — Patient Instructions (Addendum)
  1.  Continue to Treat inflammation:   A.  Dymista -1 spray each nostril twice a day  B.  Montelukast 10 mg -1 tablet once a day  C.  Symbicort 160 - 2 inhalations two times per day with spacer  2.  Continue to Treat reflux:   A.  Consolidate all caffeine and chocolate consumption  B.  Nexium 40 mg twice a day  3.  Continue to Treat headache:   A.  Consolidate all caffeine and chocolate consumption  4. If needed:   A.  Nasal saline spray  B.  OTC antihistamine  C.  Pro Air HFA 2 puffs every 4-6 hours  D. OTC Mucinex DM  5.  Blood - CBC w/diff, IgA/G/M, Area 2 profile.  Further evaluation?  6.  Return to clinic in summer 2020 or earlier if problem

## 2019-01-31 ENCOUNTER — Encounter: Payer: Self-pay | Admitting: Allergy and Immunology

## 2019-02-02 LAB — CBC WITH DIFFERENTIAL/PLATELET
Basophils Absolute: 0.1 10*3/uL (ref 0.0–0.2)
Basos: 1 %
EOS (ABSOLUTE): 0.1 10*3/uL (ref 0.0–0.4)
Eos: 1 %
Hematocrit: 43.2 % (ref 34.0–46.6)
Hemoglobin: 14.7 g/dL (ref 11.1–15.9)
Immature Grans (Abs): 0 10*3/uL (ref 0.0–0.1)
Immature Granulocytes: 0 %
Lymphocytes Absolute: 3.5 10*3/uL — ABNORMAL HIGH (ref 0.7–3.1)
Lymphs: 43 %
MCH: 28.6 pg (ref 26.6–33.0)
MCHC: 34 g/dL (ref 31.5–35.7)
MCV: 84 fL (ref 79–97)
Monocytes Absolute: 0.6 10*3/uL (ref 0.1–0.9)
Monocytes: 7 %
Neutrophils Absolute: 3.9 10*3/uL (ref 1.4–7.0)
Neutrophils: 48 %
Platelets: 363 10*3/uL (ref 150–450)
RBC: 5.14 x10E6/uL (ref 3.77–5.28)
RDW: 13.3 % (ref 11.7–15.4)
WBC: 8.1 10*3/uL (ref 3.4–10.8)

## 2019-02-02 LAB — ALLERGENS W/TOTAL IGE AREA 2

## 2019-02-02 LAB — IGG, IGA, IGM
IgA/Immunoglobulin A, Serum: 165 mg/dL (ref 87–352)
IgG (Immunoglobin G), Serum: 776 mg/dL (ref 700–1600)
IgM (Immunoglobulin M), Srm: 41 mg/dL (ref 26–217)

## 2019-02-19 DIAGNOSIS — E890 Postprocedural hypothyroidism: Secondary | ICD-10-CM | POA: Diagnosis not present

## 2019-02-19 DIAGNOSIS — R52 Pain, unspecified: Secondary | ICD-10-CM | POA: Diagnosis not present

## 2019-02-19 DIAGNOSIS — M62838 Other muscle spasm: Secondary | ICD-10-CM | POA: Diagnosis not present

## 2019-03-12 DIAGNOSIS — R1084 Generalized abdominal pain: Secondary | ICD-10-CM | POA: Diagnosis not present

## 2019-03-12 DIAGNOSIS — R399 Unspecified symptoms and signs involving the genitourinary system: Secondary | ICD-10-CM | POA: Diagnosis not present

## 2019-03-19 ENCOUNTER — Other Ambulatory Visit: Payer: 59

## 2019-03-28 ENCOUNTER — Telehealth: Payer: Self-pay

## 2019-03-28 NOTE — Telephone Encounter (Signed)
Due to current COVID 19 pandemic, our office is severely reducing in office visits for at least the next 2 weeks, in order to minimize the risk to our patients and healthcare providers.   I called pt to offer her a sooner appt with Dr. Jannifer Franklin as a virtual visit. No answer, left a message asking her to call me back. If pt calls back, please discuss this with her and obtain consent, and schedule her for a sooner virtual visit.

## 2019-03-28 NOTE — Telephone Encounter (Signed)
Pt states she wants to keep her appt she has

## 2019-03-30 ENCOUNTER — Other Ambulatory Visit: Payer: Self-pay

## 2019-03-30 MED ORDER — AZELASTINE-FLUTICASONE 137-50 MCG/ACT NA SUSP: 2.0000 | Freq: Every day | NASAL | 5 refills | Status: DC

## 2019-04-09 ENCOUNTER — Other Ambulatory Visit: Payer: Self-pay

## 2019-04-09 MED ORDER — BUDESONIDE-FORMOTEROL FUMARATE 160-4.5 MCG/ACT IN AERO: INHALATION_SPRAY | RESPIRATORY_TRACT | 1 refills | Status: DC

## 2019-04-11 DIAGNOSIS — I10 Essential (primary) hypertension: Secondary | ICD-10-CM | POA: Diagnosis not present

## 2019-04-11 DIAGNOSIS — M797 Fibromyalgia: Secondary | ICD-10-CM | POA: Diagnosis not present

## 2019-04-17 NOTE — Telephone Encounter (Signed)
Due to current COVID 19 pandemic, our office is severely reducing in office visits until further notice, in order to minimize the risk to our patients and healthcare providers.   Called patient to follow-up on a virtual visit for her 5/14 appointment. Patient declined a virtual visit and stated she would rather come in the office. I explained that we are taking necessary precautions to keep patients and staff safe by only allowing just a few patients to come back into the office. Patient understands that when she arrives she will need to pull her car up to the entrance and wait for direction from a staff member. She is aware that she will have her temp taken and be asked screening questions about COVID-19. She is aware that RN will call her prior to appointment.

## 2019-04-19 ENCOUNTER — Other Ambulatory Visit: Payer: Self-pay

## 2019-04-19 ENCOUNTER — Ambulatory Visit (INDEPENDENT_AMBULATORY_CARE_PROVIDER_SITE_OTHER): Payer: 59 | Admitting: Neurology

## 2019-04-19 ENCOUNTER — Encounter: Payer: Self-pay | Admitting: Neurology

## 2019-04-19 VITALS — BP 132/82 | HR 89 | Temp 97.9°F | Ht 67.0 in | Wt 206.0 lb

## 2019-04-19 DIAGNOSIS — R413 Other amnesia: Secondary | ICD-10-CM | POA: Diagnosis not present

## 2019-04-19 DIAGNOSIS — G4762 Sleep related leg cramps: Secondary | ICD-10-CM

## 2019-04-19 DIAGNOSIS — M797 Fibromyalgia: Secondary | ICD-10-CM | POA: Diagnosis not present

## 2019-04-19 NOTE — Progress Notes (Signed)
Reason for visit: Fibromyalgia, muscle cramps  Referring physician: Dr. Whitmore [name] is a 53 y.o. female  History of present illness:  Rhonda Bradley is a 53 year old right-handed white female with a history of fibromyalgia that dates back greater than 11 or 12 years.  The patient has had generalized neuromuscular pain including the neck, shoulders, back, hips, and pain below the knees as well.  The patient has tenderness to touch below the knees, she has trigger points throughout the body.  She has had gradual worsening of her generalized neuromuscular pain over several years.  The patient is having relatively frequent leg cramps that may occur primarily at night, occasionally during the day.  She is on Crestor currently.  The patient takes tizanidine at night which helps her get to sleep but she will wake up many times during the night with discomfort or with muscle cramps.  She has had increasing fatigue, she is not sleeping well, she does report some slight gait instability but no falls.  She denies issues controlling the bowels or the bladder.  She does have some difficulty with focusing and concentrating.  She reports no true focal weakness of the extremities.  She has had a recent MRI of the cervical spine and thoracic spine that did not show spinal cord lesions, she read an article recently and now is concerned that she has multiple sclerosis.  She comes to this office for further evaluation.  Past Medical History:  Diagnosis Date  . Anemia    as a child   . Anxiety   . Asthma   . Complication of anesthesia    slow to wake up age 43, but since cholecystectomy and no problems   . Depression   . Family history of anesthesia complication    father slow to wake up   . Fibromyalgia   . GERD (gastroesophageal reflux disease)   . Gestational diabetes   . Headache   . Hypertension     Past Surgical History:  Procedure Laterality Date  . BREAST BIOPSY    . BREAST  EXCISIONAL BIOPSY    . CESAREAN SECTION     x 3  . CHOLECYSTECTOMY    . THYROIDECTOMY N/A 09/19/2014   Procedure: THYROIDECTOMY;  Surgeon: Armandina Gemma, MD;  Location: [facility];  Service: General;  Laterality: N/A;    Family History  Problem Relation Age of Onset  . Breast cancer Maternal Aunt   . Allergic rhinitis Mother   . Asthma Mother   . Eczema Father   . Angioedema Maternal Grandfather   . Urticaria Maternal Grandfather     Social history:  reports that she has never smoked. She has never used smokeless tobacco. She reports current alcohol use. She reports that she does not use drugs.  Medications:  Prior to Admission medications   Medication Sig Start Date End Date Taking? Authorizing Provider  albuterol (PROVENTIL HFA;VENTOLIN HFA) 108 (90 Base) MCG/ACT inhaler Inhale 2 puffs into the lungs every 4 (four) hours as needed for wheezing or shortness of breath. 07/25/18  Yes Kozlow, Donnamarie Poag, MD  Azelastine-Fluticasone (DYMISTA) 137-50 MCG/ACT SUSP Place 2 sprays into both nostrils daily. 03/30/19  Yes Kozlow, Donnamarie Poag, MD  budesonide-formoterol (SYMBICORT) 160-4.5 MCG/ACT inhaler INHALE TWO PUFFS TWICE DAILY TO PREVENT COUGH OR WHEEZE. RINSE MOUTH AFTER USE. 04/09/19  Yes Kozlow, Donnamarie Poag, MD  calcium carbonate (OS-CAL - DOSED IN MG OF ELEMENTAL CALCIUM) 1250 MG tablet Take 2 tablets (1,000 mg  of elemental calcium total) by mouth 2 (two) times daily with a meal. 09/20/14  Yes Gerkin, Sherren Mocha, MD  diphenhydramine-acetaminophen (TYLENOL PM) 25-500 MG TABS Take 2 tablets by mouth at bedtime as needed (sleep).   Yes [provider]  esomeprazole (NEXIUM) 40 MG capsule Take 1 capsule (40 mg total) by mouth 2 (two) times daily. 07/25/18  Yes Kozlow, Donnamarie Poag, MD  furosemide (LASIX) 40 MG tablet Take 80 mg by mouth daily. Patient takes as needed per patient    Yes [provider]  irbesartan (AVAPRO) 75 MG tablet Take 75 mg by mouth daily. 03/16/19  Yes [provider]   levothyroxine (SYNTHROID, LEVOTHROID) 100 MCG tablet TAKE 1 TABLET ONCE DAILY ON AN EMPTY STOMACH 30 MINUTES BEFORE BREAKFAST 12/04/17  Yes [provider]  liothyronine (CYTOMEL) 5 MCG tablet TAKE 1 TABLET BY MOUTH TWICE A DAY ON AN EMPTY STOMACH 12/16/17  Yes [provider]  loratadine (CLARITIN) 10 MG tablet Take 10 mg by mouth every morning.   Yes [provider]  meloxicam (MOBIC) 15 MG tablet 1 TABLET ONCE A DAY AS NEEDED ORALLY 30 12/26/17  Yes [provider]  metFORMIN (GLUCOPHAGE-XR) 500 MG 24 hr tablet 3 TABLETS WITH EVENING MEAL ONCE A DAY ORALLY 30 DAY(S) 11/04/17  Yes [provider]  montelukast (SINGULAIR) 10 MG tablet Take 1 tablet (10 mg total) by mouth at bedtime. 01/30/19  Yes Kozlow, Donnamarie Poag, MD  rosuvastatin (CRESTOR) 5 MG tablet  07/25/18  Yes [provider]  tiZANidine (ZANAFLEX) 4 MG tablet  01/09/18  Yes [provider]  losartan (COZAAR) 50 MG tablet Take 50 mg by mouth daily. 03/13/18   [provider]  PREMARIN vaginal cream PLEASE SEE ATTACHED FOR DETAILED DIRECTIONS 02/26/19   [provider]  ranitidine (ZANTAC) 300 MG capsule Take 1 capsule (300 mg total) by mouth every evening. Patient not taking: Reported on 04/19/2019 07/25/18   Jiles Prows, MD      Allergies  Allergen Reactions  . Erythromycin Nausea And Vomiting  . Flagyl [Metronidazole] Rash  . Penicillins Rash    ROS:  Out of a complete 14 system review of symptoms, the patient complains only of the following symptoms, and all other reviewed systems are negative.  Fatigue Chest pain, palpitations of the heart, swelling in the legs Ringing in the ears Birthmarks Blurred vision, eye pain Shortness of breath, cough, wheezing Blood in the stools, constipation  Anemia, easy bruising, swollen lymph nodes Feeling hot, flushing, heat intolerance Joint pain, muscle cramps, aching muscles Skin sensitivity, frequent infections  Headache, numbness, weakness, dizziness, tremor Anxiety, not enough sleep, decreased energy, disinterest in activities, racing thoughts Sleepiness   Blood pressure 132/82, pulse 89, temperature 97.9 F (36.6 C), temperature source Oral, height 5\' 7"  (1.702 m), weight 206 lb (93.4 kg).  Physical Exam  General: The patient is alert and cooperative at the time of the examination.  The patient is moderately obese.  Eyes: Pupils are equal, round, and reactive to light. Discs are flat bilaterally.  Neck: The neck is supple, no carotid bruits are noted.  Respiratory: The respiratory examination is clear.  Cardiovascular: The cardiovascular examination reveals a regular rate and rhythm, no obvious murmurs or rubs are noted.  Skin: Extremities are without significant edema.  Neurologic Exam  Mental status: The patient is alert and oriented x 3 at the time of the examination. The patient has apparent normal recent and remote memory, with an apparently normal  attention span and concentration ability.  Cranial nerves: Facial symmetry is present. There is good sensation of the face to pinprick and soft touch bilaterally. The strength of the facial muscles and the muscles to head turning and shoulder shrug are normal bilaterally. Speech is well enunciated, no aphasia or dysarthria is noted. Extraocular movements are full. Visual fields are full. The tongue is midline, and the patient has symmetric elevation of the soft palate. No obvious hearing deficits are noted.  Motor: The motor testing reveals 5 over 5 strength of all 4 extremities. Good symmetric motor tone is noted throughout.  Sensory: Sensory testing is intact to pinprick, soft touch, vibration sensation, and position sense on all 4 extremities. No evidence of extinction is noted.  Coordination: Cerebellar testing reveals good finger-nose-finger and heel-to-shin bilaterally.  Gait and station: Gait is normal. Tandem gait is normal.  Romberg is negative. No drift is seen.  Reflexes: Deep tendon reflexes are symmetric and normal bilaterally. Toes are downgoing bilaterally.   Assessment/Plan:  1.  Fibromyalgia  2.  Nocturnal leg cramps  The patient does report some issues with focusing and memory which may be related to fibromyalgia.  I do not believe that she has demyelinating disease but she is concerned about this issue.  We will check MRI of the brain.  The patient will try magnesium supplementation for the nocturnal leg cramps, if this is not effective we may try baclofen in the future.  She will follow-up here if needed.  Rhonda Alexanders MD 04/19/2019 3:18 PM  [facility] [address] [place] [employer], [place] [address]  Phone [phone] Fax [phone]

## 2019-04-20 ENCOUNTER — Telehealth: Payer: Self-pay | Admitting: Neurology

## 2019-04-20 NOTE — Telephone Encounter (Signed)
Pt called stating that she did not remember if provider asked her about dizziness on her appt. and she wanted to inform us that she does experience dizziness almost daily and she has experienced numbness on the L side of head where it will go complety numb momentarily. The last time she experienced the numbness was about 2 weeks ago. Please advise.

## 2019-04-23 NOTE — Telephone Encounter (Signed)
I Called the patient.  Patient is having intermittent episodes of left facial numbness and some dizziness, we will be checking MRI of the brain.  I will contact her when I get the results.

## 2019-04-25 ENCOUNTER — Telehealth: Payer: Self-pay | Admitting: Neurology

## 2019-04-25 NOTE — Telephone Encounter (Signed)
Pt has called stating that when she saw Dr Jannifer Franklin last week she was told he would order a MRI for her and she has not heard anything. Please call

## 2019-04-25 NOTE — Telephone Encounter (Signed)
Patient returned my call she is scheduled for 05/02/19 at [facility].

## 2019-04-25 NOTE — Telephone Encounter (Signed)
LVM for pt to call back about scheduling mri. [employer] Auth: [employer] via [facility]

## 2019-04-25 NOTE — Telephone Encounter (Signed)
Error

## 2019-04-25 NOTE — Telephone Encounter (Signed)
Noted  

## 2019-04-25 NOTE — Telephone Encounter (Signed)
The order is in there I am a week behind I will work on it as soon as I can.

## 2019-05-02 ENCOUNTER — Other Ambulatory Visit: Payer: 59

## 2019-05-02 ENCOUNTER — Other Ambulatory Visit: Payer: Self-pay

## 2019-05-02 ENCOUNTER — Ambulatory Visit (INDEPENDENT_AMBULATORY_CARE_PROVIDER_SITE_OTHER): Payer: 59

## 2019-05-02 DIAGNOSIS — Z0000 Encounter for general adult medical examination without abnormal findings: Secondary | ICD-10-CM | POA: Diagnosis not present

## 2019-05-02 DIAGNOSIS — R413 Other amnesia: Secondary | ICD-10-CM

## 2019-05-03 ENCOUNTER — Telehealth: Payer: Self-pay | Admitting: Neurology

## 2019-05-03 NOTE — Telephone Encounter (Signed)
I called the patient.  MRI of the brain was normal.   MRI brain 05/03/19:   IMPRESSION:   Normal MRI brain (without).

## 2019-05-08 ENCOUNTER — Ambulatory Visit: Payer: 59 | Admitting: Allergy and Immunology

## 2019-05-11 ENCOUNTER — Other Ambulatory Visit: Payer: Self-pay

## 2019-05-11 ENCOUNTER — Ambulatory Visit
Admission: RE | Admit: 2019-05-11 | Discharge: 2019-05-11 | Disposition: A | Discharge status: Home / Self Care | Payer: 59 | Source: Ambulatory Visit | Attending: Nurse Practitioner | Admitting: Nurse Practitioner

## 2019-05-11 DIAGNOSIS — M810 Age-related osteoporosis without current pathological fracture: Secondary | ICD-10-CM

## 2019-07-05 ENCOUNTER — Ambulatory Visit (INDEPENDENT_AMBULATORY_CARE_PROVIDER_SITE_OTHER): Payer: 59 | Admitting: Bariatrics

## 2019-07-05 ENCOUNTER — Encounter (INDEPENDENT_AMBULATORY_CARE_PROVIDER_SITE_OTHER): Payer: Self-pay | Admitting: Bariatrics

## 2019-07-05 ENCOUNTER — Other Ambulatory Visit: Payer: Self-pay

## 2019-07-05 VITALS — BP 114/70 | HR 65 | Temp 98.0°F | Ht 66.0 in | Wt 203.0 lb

## 2019-07-05 DIAGNOSIS — R0602 Shortness of breath: Secondary | ICD-10-CM | POA: Diagnosis not present

## 2019-07-05 DIAGNOSIS — I10 Essential (primary) hypertension: Secondary | ICD-10-CM

## 2019-07-05 DIAGNOSIS — E038 Other specified hypothyroidism: Secondary | ICD-10-CM | POA: Diagnosis not present

## 2019-07-05 DIAGNOSIS — Z9189 Other specified personal risk factors, not elsewhere classified: Secondary | ICD-10-CM

## 2019-07-05 DIAGNOSIS — F3289 Other specified depressive episodes: Secondary | ICD-10-CM | POA: Diagnosis not present

## 2019-07-05 DIAGNOSIS — Z0289 Encounter for other administrative examinations: Secondary | ICD-10-CM

## 2019-07-05 DIAGNOSIS — E669 Obesity, unspecified: Secondary | ICD-10-CM

## 2019-07-05 DIAGNOSIS — R5383 Other fatigue: Secondary | ICD-10-CM | POA: Diagnosis not present

## 2019-07-05 DIAGNOSIS — K760 Fatty (change of) liver, not elsewhere classified: Secondary | ICD-10-CM

## 2019-07-05 DIAGNOSIS — E7849 Other hyperlipidemia: Secondary | ICD-10-CM

## 2019-07-05 DIAGNOSIS — E119 Type 2 diabetes mellitus without complications: Secondary | ICD-10-CM

## 2019-07-05 DIAGNOSIS — Z6832 Body mass index (BMI) 32.0-32.9, adult: Secondary | ICD-10-CM

## 2019-07-05 DIAGNOSIS — E559 Vitamin D deficiency, unspecified: Secondary | ICD-10-CM

## 2019-07-05 NOTE — Progress Notes (Signed)
.  Office: [phone]  /  Fax: [phone]   HPI:   Chief Complaint: OBESITY  Rhonda Bradley (MR# [id number]) is a 53 y.o. female who presents on 07/05/2019 for obesity evaluation and treatment. Current BMI is Body mass index is 32.77 kg/m.Rhonda Bradley has struggled with obesity for years and has been unsuccessful in either losing weight or maintaining long term weight loss. Rhonda Bradley attended our information session and states she is currently in the action stage of change and ready to dedicate time achieving and maintaining a healthier weight.   Rhonda Bradley states that she likes to cook, but her cooking is limited due to fatigue. She craves sweets and states she dislikes seafood.  Rhonda Bradley states her family eats meals together she thinks her family will eat healthier with her her desired weight loss is 18-23 lbs she has been heavy most of her life she started gaining weight after fibromyalgia started  her heaviest weight ever was 243 lbs. she is a picky eater and doesn't like to eat healthier foods  she has significant food cravings issues  she wakes up frquently in the middle of the night to eat  she frequently makes poor food choices she has problems with excessive hunger  she frequently eats larger portions than normal    Fatigue Rhonda Bradley feels her energy is lower than it should be. This has worsened with weight gain and has worsened recently. Rhonda Bradley admits to daytime somnolence and admits to waking up still tired. Patient is at risk for obstructive sleep apnea. Patent has a history of symptoms of daytime fatigue and morning headache. Patient generally gets 4-6 hours of sleep per night, and states half the time they sleep well and the other half they do not sleep well. Snoring is not present. Apneic episodes are not present. Rhonda Bradley has been diagnosed with both Chronic Fatigue Syndrome and Fibromyalgia. Epworth Sleepiness Score is 12.  Dyspnea on exertion Rhonda Bradley notes  increasing shortness of breath with exercising and seems to be worsening over time with weight gain. She notes getting out of breath sooner with activity than she used to. This has gotten worse recently. [name] denies orthopnea.  Hypertension Rhonda Bradley is a 53 y.o. female with hypertension and is on medication.  Rhonda Bradley denies chest pain or shortness of breath on exertion. She is working weight loss to help control her blood pressure with the goal of decreasing her risk of heart attack and stroke. Rhonda Bradley's blood pressure is currently controlled.  At risk for cardiovascular disease Rhonda Bradley is at a higher than average risk for cardiovascular disease due to obesity. She currently denies any chest pain.  Hypercholesterolemia Rhonda Bradley is taking Crestor. She denies myalgias.  Hypothyroidism Rhonda Bradley has a diagnosis of hypothyroidism. She is taking Synthroid.  Diabetes II Rhonda Bradley has a diagnosis of diabetes type II and is taking metformin. Rhonda Bradley states fasting blood sugars range between 150 and 170; 2-hour postprandials range between 200 and 300 as of 05/02/2019. Last A1c was reported to be 7.9. She has been working on intensive lifestyle modifications including diet, exercise, and weight loss to help control her blood glucose levels.  Vitamin D deficiency Rhonda Bradley has a diagnosis of Vitamin D deficiency. Her last Vitamin D level was reported to be 36.9. She is currently taking OTC Vit D and denies nausea, vomiting or muscle weakness.  Fatty Liver Rhonda Bradley had an abdominal ultrasound on 08/18/2018 which revealed diffuse hepatic steatosis.  Depression with emotional eating behaviors Rhonda Bradley is struggling with emotional  eating and using food for comfort to the extent that it is negatively impacting her health. She often snacks when she is not hungry. Rhonda Bradley sometimes feels she is out of control and then feels guilty that she made poor food choices. She has  been working on behavior modification techniques to help reduce her emotional eating and has been somewhat successful. She shows no sign of suicidal or homicidal ideations.  Depression Screen Rhonda Bradley's Food and Mood (modified PHQ-9) score was 9. Depression screen PHQ 2/9 07/05/2019  Decreased Interest 1  Down, Depressed, Hopeless 1  PHQ - 2 Score 2  Altered sleeping 0  Tired, decreased energy 1  Change in appetite 3  Feeling bad or failure about yourself  0  Trouble concentrating 3  Moving slowly or fidgety/restless 0  Suicidal thoughts 0  PHQ-9 Score 9  Difficult doing work/chores Extremely dIfficult   ASSESSMENT AND PLAN:  Other fatigue - Plan: EKG 12-Lead  Shortness of breath on exertion  Essential hypertension  Other specified hypothyroidism  Type 2 diabetes mellitus without complication, without long-term current use of insulin (HCC) - Plan: Microalbumin / creatinine urine ratio, Comprehensive metabolic panel, C-peptide  Vitamin D deficiency - Plan: VITAMIN D 25 Hydroxy (Vit-D Deficiency, Fractures)  Fatty liver  Other hyperlipidemia - Plan: Lipid Panel With LDL/HDL Ratio  Other depression - with emotional eating  At risk for heart disease  Class 1 obesity with serious comorbidity and body mass index (BMI) of 32.0 to 32.9 in adult, unspecified obesity type  PLAN:  Fatigue Rhonda Bradley was informed that her fatigue may be related to obesity, depression or many other causes. Labs will be ordered, and in the meanwhile [name] has agreed to work on diet, exercise and weight loss to help with fatigue. Proper sleep hygiene was discussed including the need for 7-8 hours of quality sleep each night. A sleep study was not ordered based on symptoms and Epworth score.  Dyspnea on exertion Rhonda Bradley's shortness of breath appears to be obesity related and exercise induced. She has been instructed to increase her exercise. She has agreed to work on weight loss and gradually  increase exercise to treat her exercise induced shortness of breath. If [name] follows our instructions and loses weight without improvement of her shortness of breath, we will plan to refer to pulmonology. We will monitor this condition regularly. Thelia agrees to this plan.  Hypertension We discussed sodium restriction, working on healthy weight loss, and a regular exercise program as the means to achieve improved blood pressure control. Rhonda Bradley agreed with this plan and agreed to follow up as directed. We will continue to monitor her blood pressure as well as her progress with the above lifestyle modifications. She will continue her medications as prescribed and will watch for signs of hypotension as she continues her lifestyle modifications.  Cardiovascular risk counseling Sapir was given extended (15 minutes) coronary artery disease prevention counseling today. She is 53 y.o. female and has risk factors for heart disease including obesity. We discussed intensive lifestyle modifications today with an emphasis on specific weight loss instructions and strategies. Pt was also informed of the importance of increasing exercise and decreasing saturated fats to help prevent heart disease.  Hypercholesterolemia Rhonda Bradley will continue Crestor and will follow-up as directed to monitor her progress.  Hypothyroidism Rhonda Bradley was informed of the importance of good thyroid control to help with weight loss efforts. She was also informed that supertheraputic thyroid levels are dangerous and will not improve weight loss results. Rhonda Bradley  will continue Synthroid and follow-up as directed to monitor her progress.  Diabetes II Arley has been given extensive diabetes education by myself today including ideal fasting and post-prandial blood glucose readings, individual ideal HgA1c goals  and hypoglycemia prevention. We discussed the importance of good blood sugar control to decrease the likelihood of  diabetic complications such as nephropathy, neuropathy, limb loss, blindness, coronary artery disease, and death. We discussed the importance of intensive lifestyle modification including diet, exercise and weight loss as the first line treatment for diabetes. Jennise agrees to continue her diabetes medications, will check FBS and 2-hour postprandials, and will follow-up at the agreed upon time.  Vitamin D Deficiency Adeleine was informed that low Vitamin D levels contributes to fatigue and are associated with obesity, breast, and colon cancer. She agrees to continue to take prescription Vit D @ 50,000 IU every week and will follow-up for routine testing of Vitamin D, at least 2-3 times per year. She was informed of the risk of over-replacement of Vitamin D and agrees to not increase her dose unless she discusses this with [place] first. Rhonda Bradley agrees to follow-up with our clinic in 2 weeks.  Fatty Liver Rhonda Bradley will continue diet and exercise, exercising 150 minutes a week (cardio and resistance) for 5-10% weight loss.  Depression with Emotional Eating Behaviors We discussed behavior modification techniques today to help Rhonda Bradley deal with her emotional eating and depression. She will be referred to Dr. Mallie Mussel, our bariatric psychologist.  Depression Screen Rhonda Bradley had a mildly positive depression screening. Depression is commonly associated with obesity and often results in emotional eating behaviors. We will monitor this closely and work on CBT to help improve the non-hunger eating patterns.   Obesity Rhonda Bradley is currently in the action stage of change and her goal is to continue with weight loss efforts.  She has agreed to follow the Category 3 plan. Rhonda Bradley will work on meal planning and intentional eating. She was given handouts on Fruit and Journaling. Coleta has been instructed to work up to a goal of 150 minutes of combined cardio and strengthening exercise per week for weight  loss and overall health benefits. We discussed the following Behavioral Modification Strategies today: increasing lean protein intake, decreasing simple carbohydrates, increasing vegetables, increase H20 intake, decrease eating out, no skipping meals, work on meal planning and easy cooking plans, and keeping healthy foods in the home.   Rhonda Bradley has agreed to follow-up with our clinic in 2 weeks. She was informed of the importance of frequent follow-up visits to maximize her success with intensive lifestyle modifications for her multiple health conditions. She was informed we would discuss her lab results at her next visit unless there is a critical issue that needs to be addressed sooner. Rhonda Bradley agreed to keep her next visit at the agreed upon time to discuss these results.  ALLERGIES: Allergies  Allergen Reactions   Erythromycin Nausea And Vomiting   Prednisone    Flagyl [Metronidazole] Rash   Penicillins Rash    MEDICATIONS: Current Outpatient Medications on File Prior to Visit  Medication Sig Dispense Refill   albuterol (PROVENTIL HFA;VENTOLIN HFA) 108 (90 Base) MCG/ACT inhaler Inhale 2 puffs into the lungs every 4 (four) hours as needed for wheezing or shortness of breath. 1 Inhaler 2   Azelastine-Fluticasone (DYMISTA) 137-50 MCG/ACT SUSP Place 2 sprays into both nostrils daily. 1 Bottle 5   Black Pepper-Turmeric (TURMERIC COMPLEX/BLACK PEPPER PO) Take by mouth.     budesonide-formoterol (SYMBICORT) 160-4.5 MCG/ACT inhaler INHALE TWO  PUFFS TWICE DAILY TO PREVENT COUGH OR WHEEZE. RINSE MOUTH AFTER USE. 30.6 g 1   calcium carbonate (OS-CAL - DOSED IN MG OF ELEMENTAL CALCIUM) 1250 MG tablet Take 2 tablets (1,000 mg of elemental calcium total) by mouth 2 (two) times daily with a meal. 60 tablet 0   Cholecalciferol (VITAMIN D3) 25 MCG (1000 UT) CAPS Take by mouth.     COLLAGEN PO Take by mouth.     cyclobenzaprine (FLEXERIL) 5 MG tablet Take 5 mg by mouth 3 (three) times  daily as needed for muscle spasms.     diphenhydramine-acetaminophen (TYLENOL PM) 25-500 MG TABS Take 2 tablets by mouth at bedtime as needed (sleep).     esomeprazole (NEXIUM) 40 MG capsule Take 1 capsule (40 mg total) by mouth 2 (two) times daily. 180 capsule 1   furosemide (LASIX) 40 MG tablet Take 80 mg by mouth daily. Patient takes as needed per patient      glucosamine-chondroitin 500-400 MG tablet Take 1 tablet by mouth 3 (three) times daily.     irbesartan (AVAPRO) 75 MG tablet Take 75 mg by mouth daily.     levothyroxine (SYNTHROID, LEVOTHROID) 100 MCG tablet TAKE 1 TABLET ONCE DAILY ON AN EMPTY STOMACH 30 MINUTES BEFORE BREAKFAST  2   liothyronine (CYTOMEL) 5 MCG tablet TAKE 1 TABLET BY MOUTH TWICE A DAY ON AN EMPTY STOMACH  5   loratadine (CLARITIN) 10 MG tablet Take 10 mg by mouth every morning.     magnesium gluconate (MAGONATE) 500 MG tablet Take 500 mg by mouth 2 (two) times daily.     meloxicam (MOBIC) 15 MG tablet 1 TABLET ONCE A DAY AS NEEDED ORALLY 30  3   metFORMIN (GLUCOPHAGE-XR) 500 MG 24 hr tablet 3 TABLETS WITH EVENING MEAL ONCE A DAY ORALLY 30 DAY(S)  11   montelukast (SINGULAIR) 10 MG tablet Take 1 tablet (10 mg total) by mouth at bedtime. 30 tablet 5   PREMARIN vaginal cream PLEASE SEE ATTACHED FOR DETAILED DIRECTIONS     ranitidine (ZANTAC) 300 MG capsule Take 1 capsule (300 mg total) by mouth every evening. 30 capsule 5   rosuvastatin (CRESTOR) 5 MG tablet      simethicone (MYLICON) 720 MG chewable tablet Chew 125 mg by mouth every 6 (six) hours as needed for flatulence.     tiZANidine (ZANAFLEX) 4 MG tablet      losartan (COZAAR) 50 MG tablet Take 50 mg by mouth daily.  2   No current facility-administered medications on file prior to visit.     PAST MEDICAL HISTORY: Past Medical History:  Diagnosis Date   Anemia    as a child    Anxiety    Asthma    Back pain    Chronic fatigue syndrome    Complication of anesthesia    slow to  wake up age 53, but since cholecystectomy and no problems    Constipation    Depression    Diabetes ([place])    Family history of anesthesia complication    father slow to wake up    Fatty liver    Fibromyalgia    GERD (gastroesophageal reflux disease)    Gestational diabetes    Headache    High blood pressure    High cholesterol    Hypertension    IBS (irritable bowel syndrome)    Joint pain    Lactose intolerance    Osteopenia    SOB (shortness of breath)  PAST SURGICAL HISTORY: Past Surgical History:  Procedure Laterality Date   BREAST BIOPSY     BREAST EXCISIONAL BIOPSY     CESAREAN SECTION     x 3   CHOLECYSTECTOMY     THYROIDECTOMY N/A 09/19/2014   Procedure: THYROIDECTOMY;  Surgeon: Armandina Gemma, MD;  Location: [facility];  Service: General;  Laterality: N/A;    SOCIAL HISTORY: Social History   Tobacco Use   Smoking status: Never Smoker   Smokeless tobacco: Never Used  Substance Use Topics   Alcohol use: Yes    Comment: socially    Drug use: No    FAMILY HISTORY: Family History  Problem Relation Age of Onset   Breast cancer Maternal Aunt    Allergic rhinitis Mother    Asthma Mother    High blood pressure Mother    High Cholesterol Mother    Depression Mother    Anxiety disorder Mother    Alcoholism Mother    Eczema Father    High blood pressure Father    High Cholesterol Father    Heart disease Father    Depression Father    Liver disease Father    Alcoholism Father    AAA (abdominal aortic aneurysm) Father    Angioedema Maternal Grandfather    Urticaria Maternal Grandfather    ROS: Review of Systems  Constitutional: Positive for malaise/fatigue.  HENT: Positive for sinus pain.        Positive for decreased hearing. Positive for ringing in the ears. Positive for hoarseness. Positive for swollen glands. Positive for neck pain.  Eyes: Positive for pain.       Positive for wearing glasses or  contacts. Positive for floaters.  Respiratory: Positive for cough, shortness of breath (with activity) and wheezing.   Cardiovascular: Positive for palpitations.  Gastrointestinal: Positive for constipation, diarrhea, heartburn and nausea. Negative for vomiting.  Musculoskeletal: Positive for back pain and joint pain. Negative for myalgias.       Negative for muscle weakness. Positive for muscle stiffness.  Skin:       Positive for skin dryness.  Neurological: Positive for dizziness, tremors and headaches.       Positive for calf/leg pain with walking. Positive for leg cramping.  Endo/Heme/Allergies:       Positive for heat intolerance.  Psychiatric/Behavioral: Positive for depression (emotional eating). Negative for suicidal ideas. The patient has insomnia.        Negative for homicidal ideas. Positive for stress.   PHYSICAL EXAM: Blood pressure 114/70, pulse 65, temperature 98 F (36.7 C), temperature source Oral, height 5\' 6"  (1.676 m), weight 203 lb (92.1 kg), SpO2 96 %. Body mass index is 32.77 kg/m. Physical Exam Vitals signs reviewed.  Constitutional:      Appearance: Normal appearance. She is well-developed. She is obese.  HENT:     Head: Normocephalic and atraumatic.     Nose: Nose normal.  Eyes:     General: No scleral icterus. Neck:     Musculoskeletal: Normal range of motion.  Cardiovascular:     Rate and Rhythm: Normal rate and regular rhythm.  Pulmonary:     Effort: Pulmonary effort is normal. No respiratory distress.  Abdominal:     Palpations: Abdomen is soft.     Tenderness: There is no abdominal tenderness.  Musculoskeletal: Normal range of motion.     Comments: Range of motion normal in all four extremities.  Skin:    General: Skin is warm and dry.  Neurological:  Mental Status: She is alert and oriented to person, place, and time.     Coordination: Coordination normal.  Psychiatric:        Mood and Affect: Mood and affect normal.         Behavior: Behavior normal.   RECENT LABS AND TESTS: BMET    Component Value Date/Time   NA 136 (L) 09/20/2014 0445   K 4.1 09/20/2014 0445   CL 99 09/20/2014 0445   CO2 26 09/20/2014 0445   GLUCOSE 192 (H) 09/20/2014 0445   BUN 8 09/20/2014 0445   CREATININE 0.60 09/20/2014 0445   CALCIUM 9.7 10/07/2014 1557   GFRNONAA >90 09/20/2014 0445   GFRAA >90 09/20/2014 0445   No results found for: HGBA1C No results found for: INSULIN CBC    Component Value Date/Time   WBC 8.1 01/30/2019 1808   WBC 9.2 09/12/2014 1515   RBC 5.14 01/30/2019 1808   RBC 4.88 09/12/2014 1515   HGB 14.7 01/30/2019 1808   HCT 43.2 01/30/2019 1808   PLT 363 01/30/2019 1808   MCV 84 01/30/2019 1808   MCH 28.6 01/30/2019 1808   MCH 29.9 09/12/2014 1515   MCHC 34.0 01/30/2019 1808   MCHC 33.7 09/12/2014 1515   RDW 13.3 01/30/2019 1808   LYMPHSABS 3.5 (H) 01/30/2019 1808   MONOABS 0.5 09/05/2010 2335   EOSABS 0.1 01/30/2019 1808   BASOSABS 0.1 01/30/2019 1808   Iron/TIBC/Ferritin/ %Sat No results found for: IRON, TIBC, FERRITIN, IRONPCTSAT Lipid Panel  No results found for: CHOL, TRIG, HDL, CHOLHDL, VLDL, LDLCALC, LDLDIRECT Hepatic Function Panel     Component Value Date/Time   PROT 6.1 09/07/2010 0635   ALBUMIN 3.2 (L) 09/07/2010 0635   AST 80 (H) 09/07/2010 0635   ALT 207 (H) 09/07/2010 0635   ALKPHOS 86 09/07/2010 0635   BILITOT 1.0 09/07/2010 0635   No results found for: TSH No results found for: Vitamin D, 25-Hydroxy  ECG shows sinus rhythm with a rate of 70 BPM, low voltage in precordial leads most likely related to body habitus. Otherwise normal.   INDIRECT CALORIMETER done today shows a VO2 of 284 and a REE of 1980. Her calculated basal metabolic rate is 9562 thus her basal metabolic rate is better than expected.  OBESITY BEHAVIORAL INTERVENTION VISIT  Today's visit was #1   Starting weight: 203 lbs Starting date: 07/05/2019 Today's weight:  203 lbs Today's date:  07/05/2019 Total lbs lost to date: 0    07/05/2019  Height 5\' 6"  (1.676 m)  Weight 203 lb (92.1 kg)  BMI (Calculated) 32.78  BLOOD PRESSURE - SYSTOLIC 130  BLOOD PRESSURE - DIASTOLIC 70  Waist Measurement  46 inches   Body Fat % 48.7 %  Total Body Water (lbs) 73 lbs  RMR 1980   ASK: We discussed the diagnosis of obesity with Rhonda Bradley today and Rhonda Bradley agreed to give [place] permission to discuss obesity behavioral modification therapy today.  ASSESS: Anila has the diagnosis of obesity and her BMI today is 32.9. Talayia is in the action stage of change.   ADVISE: Tameeka was educated on the multiple health risks of obesity as well as the benefit of weight loss to improve her health. She was advised of the need for long term treatment and the importance of lifestyle modifications to improve her current health and to decrease her risk of future health problems.  AGREE: Multiple dietary modification options and treatment options were discussed and  Taeya agreed to follow the  recommendations documented in the above note.  ARRANGE: Marlei was educated on the importance of frequent visits to treat obesity as outlined per CMS and USPSTF guidelines and agreed to schedule her next follow up appointment today.  Rhonda Bradley, am acting as Location manager for [employer], DO   I have reviewed the above documentation for accuracy and completeness, and I agree with the above. -Jearld Lesch, DO

## 2019-07-06 LAB — COMPREHENSIVE METABOLIC PANEL
ALT: 56 IU/L — ABNORMAL HIGH (ref 0–32)
AST: 26 IU/L (ref 0–40)
Albumin/Globulin Ratio: 2.4 — ABNORMAL HIGH (ref 1.2–2.2)
Albumin: 5.1 g/dL — ABNORMAL HIGH (ref 3.8–4.9)
Alkaline Phosphatase: 59 IU/L (ref 39–117)
BUN/Creatinine Ratio: 18 (ref 9–23)
BUN: 12 mg/dL (ref 6–24)
Bilirubin Total: 0.5 mg/dL (ref 0.0–1.2)
CO2: 22 mmol/L (ref 20–29)
Calcium: 10.1 mg/dL (ref 8.7–10.2)
Chloride: 99 mmol/L (ref 96–106)
Creatinine, Ser: 0.65 mg/dL (ref 0.57–1.00)
GFR calc Af Amer: 116 mL/min/{1.73_m2} (ref >59)
GFR calc non Af Amer: 101 mL/min/{1.73_m2} (ref >59)
Globulin, Total: 2.1 g/dL (ref 1.5–4.5)
Glucose: 167 mg/dL — ABNORMAL HIGH (ref 65–99)
Potassium: 4.7 mmol/L (ref 3.5–5.2)
Sodium: 142 mmol/L (ref 134–144)
Total Protein: 7.2 g/dL (ref 6.0–8.5)

## 2019-07-06 LAB — LIPID PANEL WITH LDL/HDL RATIO
Cholesterol, Total: 172 mg/dL (ref 100–199)
HDL: 42 mg/dL (ref >39)
LDL Calculated: 105 mg/dL — ABNORMAL HIGH (ref 0–99)
LDl/HDL Ratio: 2.5 ratio (ref 0.0–3.2)
Triglycerides: 124 mg/dL (ref 0–149)
VLDL Cholesterol Cal: 25 mg/dL (ref 5–40)

## 2019-07-06 LAB — VITAMIN D 25 HYDROXY (VIT D DEFICIENCY, FRACTURES): Vit D, 25-Hydroxy: 47.1 ng/mL (ref 30.0–100.0)

## 2019-07-06 LAB — C-PEPTIDE: C-Peptide: 4.8 ng/mL — ABNORMAL HIGH (ref 1.1–4.4)
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
## 2019-07-12 ENCOUNTER — Other Ambulatory Visit: Payer: Self-pay

## 2019-07-12 ENCOUNTER — Ambulatory Visit (INDEPENDENT_AMBULATORY_CARE_PROVIDER_SITE_OTHER): Payer: 59 | Admitting: Psychology

## 2019-07-12 DIAGNOSIS — F3289 Other specified depressive episodes: Secondary | ICD-10-CM

## 2019-07-19 ENCOUNTER — Ambulatory Visit (INDEPENDENT_AMBULATORY_CARE_PROVIDER_SITE_OTHER): Payer: 59 | Admitting: Bariatrics

## 2019-07-19 ENCOUNTER — Other Ambulatory Visit: Payer: Self-pay

## 2019-07-19 ENCOUNTER — Encounter (INDEPENDENT_AMBULATORY_CARE_PROVIDER_SITE_OTHER): Payer: Self-pay | Admitting: Bariatrics

## 2019-07-19 ENCOUNTER — Other Ambulatory Visit (INDEPENDENT_AMBULATORY_CARE_PROVIDER_SITE_OTHER): Payer: Self-pay | Admitting: Bariatrics

## 2019-07-19 ENCOUNTER — Encounter (INDEPENDENT_AMBULATORY_CARE_PROVIDER_SITE_OTHER): Payer: Self-pay

## 2019-07-19 VITALS — BP 105/68 | HR 85 | Temp 97.6°F | Ht 66.0 in | Wt 203.0 lb

## 2019-07-19 DIAGNOSIS — E669 Obesity, unspecified: Secondary | ICD-10-CM

## 2019-07-19 DIAGNOSIS — E119 Type 2 diabetes mellitus without complications: Secondary | ICD-10-CM | POA: Diagnosis not present

## 2019-07-19 DIAGNOSIS — R945 Abnormal results of liver function studies: Secondary | ICD-10-CM

## 2019-07-19 DIAGNOSIS — E559 Vitamin D deficiency, unspecified: Secondary | ICD-10-CM | POA: Diagnosis not present

## 2019-07-19 DIAGNOSIS — Z9189 Other specified personal risk factors, not elsewhere classified: Secondary | ICD-10-CM | POA: Diagnosis not present

## 2019-07-19 DIAGNOSIS — E038 Other specified hypothyroidism: Secondary | ICD-10-CM

## 2019-07-19 DIAGNOSIS — F3289 Other specified depressive episodes: Secondary | ICD-10-CM

## 2019-07-19 DIAGNOSIS — R7989 Other specified abnormal findings of blood chemistry: Secondary | ICD-10-CM

## 2019-07-19 DIAGNOSIS — Z6832 Body mass index (BMI) 32.0-32.9, adult: Secondary | ICD-10-CM

## 2019-07-19 MED ORDER — BUPROPION HCL ER (SR) 150 MG PO TB12: 150.0000 mg | ORAL_TABLET | Freq: Every day | ORAL | 0 refills | Status: DC

## 2019-07-20 LAB — MICROALBUMIN / CREATININE URINE RATIO
Creatinine, Urine: 8.8 mg/dL
Microalbumin, Urine: <3 ug/mL

## 2019-07-24 NOTE — Progress Notes (Signed)
Office: [phone]  /  Fax: [phone]   HPI:   Chief Complaint: OBESITY Rhonda Bradley is here to discuss her progress with her obesity treatment plan. She is on the Category 3 plan and is following her eating plan approximately 70 % of the time. She states she is doing pool exercises 20 minutes 5 to 6 times per week. Rhonda Bradley has remained the same weight. She has had multiple celebrations. She struggled with the plan slightly. Rhonda Bradley struggles with pain (eating for energy). Her weight is 206 lb (93.4 kg) today and she has maintained weight since her last visit. She has lost 0 lbs since starting treatment with [place].  Diabetes II Rhonda Bradley has a diagnosis of diabetes type II. Bevan is taking metformin and she denies any hypoglycemic episodes. Her last C-peptide was at 4.8 and last A1c was at 7.9 She has been working on intensive lifestyle modifications including diet, exercise, and weight loss to help control her blood glucose levels.  Elevated LFTs Chalon has a diagnosis of elevated LFTs. Her ALT is slightly elevated at [place] had an ultrasound in September 2019 which showed diffusely increased echogenicity.   Hypothyroidism Rhonda Bradley has a diagnosis of hypothyroidism. She is currently taking Synthroid. She denies hot or cold intolerance or palpitations.  Depression with emotional eating behaviors Rhonda Bradley is struggling with emotional eating and using food for comfort to the extent that it is negatively impacting her health. She often snacks when she is not hungry. Rhonda Bradley sometimes feels she is out of control and then feels guilty that she made poor food choices. She has been working on behavior modification techniques to help reduce her emotional eating and has been somewhat successful. She shows no sign of suicidal or homicidal ideations.  Vitamin D deficiency Rhonda Bradley has a diagnosis of vitamin D deficiency. She is currently taking vit D and denies nausea, vomiting  or muscle weakness.  At risk for osteopenia and osteoporosis Rhonda Bradley is at higher risk of osteopenia and osteoporosis due to vitamin D deficiency.   ASSESSMENT AND PLAN:  Type 2 diabetes mellitus without complication, without long-term current use of insulin (HCC)  Elevated LFTs  Other specified hypothyroidism  Vitamin D deficiency  Other depression - Plan: buPROPion (WELLBUTRIN SR) 150 MG 12 hr tablet  At risk for osteoporosis  Class 1 obesity with serious comorbidity and body mass index (BMI) of 32.0 to 32.9 in adult, unspecified obesity type  PLAN:  Diabetes II Rhonda Bradley has been given extensive diabetes education by myself today including ideal fasting and post-prandial blood glucose readings, individual ideal Hgb A1c goals and hypoglycemia prevention. We discussed the importance of good blood sugar control to decrease the likelihood of diabetic complications such as nephropathy, neuropathy, limb loss, blindness, coronary artery disease, and death. We discussed the importance of intensive lifestyle modification including diet, exercise and weight loss as the first line treatment for diabetes. Hindy agrees to continue her diabetes medications and will follow up at the agreed upon time.  Elevated LFTs We discussed the likely diagnosis of non alcoholic fatty liver disease today and how this condition is obesity related. Rhonda Bradley was educated on her risk of developing NASH or even liver failure and the only proven treatment for NAFLD was weight loss. Rhonda Bradley agreed to continue with her weight loss and decrease to at least 10% and increase exercise.   Hypothyroidism Rhonda Bradley was informed of the importance of good thyroid control to help with weight loss efforts. She was also informed that supertherapeutic thyroid levels  are dangerous and will not improve weight loss results. Rhonda Bradley will continue her medications as prescribed and follow up as directed.  Depression with  Emotional Eating Behaviors We discussed behavior modification techniques today to help Rhonda Bradley deal with her emotional eating and depression. She has agreed to start Wellbutrin SR 150 mg daily #30 with no refills and follow up as directed.  Vitamin D Deficiency Rhonda Bradley was informed that low vitamin D levels contributes to fatigue and are associated with obesity, breast, and colon cancer. She agrees to continue OTC vitamin D @2 ,000 IU daily and she will follow up for routine testing of vitamin D, at least 2-3 times per year. She was informed of the risk of over-replacement of vitamin D and agrees to not increase her dose unless she discusses this with [place] first. Rhonda Bradley agrees to follow up as directed.  Obesity Rhonda Bradley is currently in the action stage of change. As such, her goal is to continue with weight loss efforts She has agreed to follow the Category 3 plan Rhonda Bradley has been instructed to work up to a goal of 150 minutes of combined cardio and strengthening exercise per week for weight loss and overall health benefits. We discussed the following Behavioral Modification Strategies today: planning for success, increase H2O intake, no skipping meals, keeping healthy foods in the home, increasing lean protein intake, decreasing simple carbohydrates, increasing vegetables, decrease eating out,  work on meal planning and intentional eating and "From Eat to Thin Thinking".  Rhonda Bradley has agreed to follow up with our clinic in 2 weeks. She was informed of the importance of frequent follow up visits to maximize her success with intensive lifestyle modifications for her multiple health conditions.  ALLERGIES: Allergies  Allergen Reactions  . Erythromycin Nausea And Vomiting  . Prednisone   . Flagyl [Metronidazole] Rash  . Penicillins Rash    MEDICATIONS: Current Outpatient Medications on File Prior to Visit  Medication Sig Dispense Refill  . albuterol (PROVENTIL HFA;VENTOLIN HFA) 108  (90 Base) MCG/ACT inhaler Inhale 2 puffs into the lungs every 4 (four) hours as needed for wheezing or shortness of breath. 1 Inhaler 2  . Azelastine-Fluticasone (DYMISTA) 137-50 MCG/ACT SUSP Place 2 sprays into both nostrils daily. 1 Bottle 5  . Black Pepper-Turmeric (TURMERIC COMPLEX/BLACK PEPPER PO) Take by mouth.    . budesonide-formoterol (SYMBICORT) 160-4.5 MCG/ACT inhaler INHALE TWO PUFFS TWICE DAILY TO PREVENT COUGH OR WHEEZE. RINSE MOUTH AFTER USE. 30.6 g 1  . calcium carbonate (OS-CAL - DOSED IN MG OF ELEMENTAL CALCIUM) 1250 MG tablet Take 2 tablets (1,000 mg of elemental calcium total) by mouth 2 (two) times daily with a meal. 60 tablet 0  . Cholecalciferol (VITAMIN D3) 25 MCG (1000 UT) CAPS Take by mouth.    . COLLAGEN PO Take by mouth.    . cyclobenzaprine (FLEXERIL) 5 MG tablet Take 5 mg by mouth 3 (three) times daily as needed for muscle spasms.    . diphenhydramine-acetaminophen (TYLENOL PM) 25-500 MG TABS Take 2 tablets by mouth at bedtime as needed (sleep).    [name] esomeprazole (NEXIUM) 40 MG capsule Take 1 capsule (40 mg total) by mouth 2 (two) times daily. 180 capsule 1  . furosemide (LASIX) 40 MG tablet Take 80 mg by mouth daily. Patient takes as needed per patient     . glucosamine-chondroitin 500-400 MG tablet Take 1 tablet by mouth 3 (three) times daily.    . irbesartan (AVAPRO) 75 MG tablet Take 75 mg by mouth daily.    [name]  levothyroxine (SYNTHROID, LEVOTHROID) 100 MCG tablet TAKE 1 TABLET ONCE DAILY ON AN EMPTY STOMACH 30 MINUTES BEFORE BREAKFAST  2  . liothyronine (CYTOMEL) 5 MCG tablet TAKE 1 TABLET BY MOUTH TWICE A DAY ON AN EMPTY STOMACH  5  . loratadine (CLARITIN) 10 MG tablet Take 10 mg by mouth every morning.    [name] losartan (COZAAR) 50 MG tablet Take 50 mg by mouth daily.  2  . magnesium gluconate (MAGONATE) 500 MG tablet Take 500 mg by mouth 2 (two) times daily.    . meloxicam (MOBIC) 15 MG tablet 1 TABLET ONCE A DAY AS NEEDED ORALLY 30  3  . metFORMIN (GLUCOPHAGE-XR)  500 MG 24 hr tablet 3 TABLETS WITH EVENING MEAL ONCE A DAY ORALLY 30 DAY(S)  11  . montelukast (SINGULAIR) 10 MG tablet Take 1 tablet (10 mg total) by mouth at bedtime. 30 tablet 5  . PREMARIN vaginal cream PLEASE SEE ATTACHED FOR DETAILED DIRECTIONS    . ranitidine (ZANTAC) 300 MG capsule Take 1 capsule (300 mg total) by mouth every evening. 30 capsule 5  . rosuvastatin (CRESTOR) 5 MG tablet     . simethicone (MYLICON) 349 MG chewable tablet Chew 125 mg by mouth every 6 (six) hours as needed for flatulence.    [name] tiZANidine (ZANAFLEX) 4 MG tablet      No current facility-administered medications on file prior to visit.     PAST MEDICAL HISTORY: Past Medical History:  Diagnosis Date  . Anemia    as a child   . Anxiety   . Asthma   . Back pain   . Chronic fatigue syndrome   . Complication of anesthesia    slow to wake up age 19, but since cholecystectomy and no problems   . Constipation   . Depression   . Diabetes ([place])   . Family history of anesthesia complication    father slow to wake up   . Fatty liver   . Fibromyalgia   . GERD (gastroesophageal reflux disease)   . Gestational diabetes   . Headache   . High blood pressure   . High cholesterol   . Hypertension   . IBS (irritable bowel syndrome)   . Joint pain   . Lactose intolerance   . Osteopenia   . SOB (shortness of breath)     PAST SURGICAL HISTORY: Past Surgical History:  Procedure Laterality Date  . BREAST BIOPSY    . BREAST EXCISIONAL BIOPSY    . CESAREAN SECTION     x 3  . CHOLECYSTECTOMY    . THYROIDECTOMY N/A 09/19/2014   Procedure: THYROIDECTOMY;  Surgeon: Armandina Gemma, MD;  Location: [facility];  Service: General;  Laterality: N/A;    SOCIAL HISTORY: Social History   Tobacco Use  . Smoking status: Never Smoker  . Smokeless tobacco: Never Used  Substance Use Topics  . Alcohol use: Yes    Comment: socially   . Drug use: No    FAMILY HISTORY: Family History  Problem Relation Age of Onset  .  Breast cancer Maternal Aunt   . Allergic rhinitis Mother   . Asthma Mother   . High blood pressure Mother   . High Cholesterol Mother   . Depression Mother   . Anxiety disorder Mother   . Alcoholism Mother   . Eczema Father   . High blood pressure Father   . High Cholesterol Father   . Heart disease Father   . Depression Father   .  Liver disease Father   . Alcoholism Father   . AAA (abdominal aortic aneurysm) Father   . Angioedema Maternal Grandfather   . Urticaria Maternal Grandfather     ROS: Review of Systems  Constitutional: Negative for weight loss.  Cardiovascular: Negative for palpitations.  Gastrointestinal: Negative for nausea and vomiting.  Musculoskeletal:       Negative for muscle weakness  Endo/Heme/Allergies:       Negative for heat or cold intolerance Negative for hypoglycemia  Psychiatric/Behavioral: Positive for depression. Negative for suicidal ideas.    PHYSICAL EXAM: Blood pressure 105/68, pulse 85, temperature 97.6 F (36.4 C), temperature source Oral, height 5\' 6"  (1.676 m), weight 206 lb (93.4 kg), SpO2 95 %. Body mass index is 33.25 kg/m. Physical Exam Vitals signs reviewed.  Constitutional:      Appearance: Normal appearance. She is well-developed. She is obese.  Cardiovascular:     Rate and Rhythm: Normal rate.  Pulmonary:     Effort: Pulmonary effort is normal.  Musculoskeletal: Normal range of motion.  Skin:    General: Skin is warm and dry.  Neurological:     Mental Status: She is alert and oriented to person, place, and time.  Psychiatric:        Mood and Affect: Mood normal.        Behavior: Behavior normal.        Thought Content: Thought content does not include homicidal or suicidal ideation.     RECENT LABS AND TESTS: BMET    Component Value Date/Time   NA 142 07/05/2019 1048   K 4.7 07/05/2019 1048   CL 99 07/05/2019 1048   CO2 22 07/05/2019 1048   GLUCOSE 167 (H) 07/05/2019 1048   GLUCOSE 192 (H) 09/20/2014 0445    BUN 12 07/05/2019 1048   CREATININE 0.65 07/05/2019 1048   CALCIUM 10.1 07/05/2019 1048   GFRNONAA 101 07/05/2019 1048   GFRAA 116 07/05/2019 1048   No results found for: HGBA1C No results found for: INSULIN CBC    Component Value Date/Time   WBC 8.1 01/30/2019 1808   WBC 9.2 09/12/2014 1515   RBC 5.14 01/30/2019 1808   RBC 4.88 09/12/2014 1515   HGB 14.7 01/30/2019 1808   HCT 43.2 01/30/2019 1808   PLT 363 01/30/2019 1808   MCV 84 01/30/2019 1808   MCH 28.6 01/30/2019 1808   MCH 29.9 09/12/2014 1515   MCHC 34.0 01/30/2019 1808   MCHC 33.7 09/12/2014 1515   RDW 13.3 01/30/2019 1808   LYMPHSABS 3.5 (H) 01/30/2019 1808   MONOABS 0.5 09/05/2010 2335   EOSABS 0.1 01/30/2019 1808   BASOSABS 0.1 01/30/2019 1808   Iron/TIBC/Ferritin/ %Sat No results found for: IRON, TIBC, FERRITIN, IRONPCTSAT Lipid Panel     Component Value Date/Time   CHOL 172 07/05/2019 1048   TRIG 124 07/05/2019 1048   HDL 42 07/05/2019 1048   LDLCALC 105 (H) 07/05/2019 1048   Hepatic Function Panel     Component Value Date/Time   PROT 7.2 07/05/2019 1048   ALBUMIN 5.1 (H) 07/05/2019 1048   AST 26 07/05/2019 1048   ALT 56 (H) 07/05/2019 1048   ALKPHOS 59 07/05/2019 1048   BILITOT 0.5 07/05/2019 1048   No results found for: TSH    OBESITY BEHAVIORAL INTERVENTION VISIT  Today's visit was # 2   Starting weight: 203 lbs Starting date: 07/05/2019 Today's weight : 203 lbs Today's date: 07/19/2019 Total lbs lost to date: 0    07/19/2019  Height  5\' 6"  (1.676 m)  Weight 203 lb (92.1 kg)  BMI (Calculated) 32.78  BLOOD PRESSURE - SYSTOLIC 111  BLOOD PRESSURE - DIASTOLIC 68   Body Fat % 55.2 %  Total Body Water (lbs) 78.6 lbs    ASK: We discussed the diagnosis of obesity with Alcus Dad today and Tove agreed to give [place] permission to discuss obesity behavioral modification therapy today.  ASSESS: Akera has the diagnosis of obesity and her BMI today is 32.78 Uriah is in  the action stage of change   ADVISE: Giulianna was educated on the multiple health risks of obesity as well as the benefit of weight loss to improve her health. She was advised of the need for long term treatment and the importance of lifestyle modifications to improve her current health and to decrease her risk of future health problems.  AGREE: Multiple dietary modification options and treatment options were discussed and  Liane agreed to follow the recommendations documented in the above note.  ARRANGE: Macenzie was educated on the importance of frequent visits to treat obesity as outlined per CMS and USPSTF guidelines and agreed to schedule her next follow up appointment today.  Corey Skains, am acting as Location manager for [employer]. Owens Shark, DO  I have reviewed the above documentation for accuracy and completeness, and I agree with the above. -Jearld Lesch, DO

## 2019-08-06 ENCOUNTER — Encounter (INDEPENDENT_AMBULATORY_CARE_PROVIDER_SITE_OTHER): Payer: Self-pay | Admitting: Bariatrics

## 2019-08-06 ENCOUNTER — Other Ambulatory Visit: Payer: Self-pay

## 2019-08-06 ENCOUNTER — Ambulatory Visit (INDEPENDENT_AMBULATORY_CARE_PROVIDER_SITE_OTHER): Payer: 59 | Admitting: Bariatrics

## 2019-08-06 VITALS — BP 96/68 | HR 97 | Temp 97.0°F | Ht 66.0 in | Wt 199.0 lb

## 2019-08-06 DIAGNOSIS — E119 Type 2 diabetes mellitus without complications: Secondary | ICD-10-CM

## 2019-08-06 DIAGNOSIS — R7989 Other specified abnormal findings of blood chemistry: Secondary | ICD-10-CM

## 2019-08-06 DIAGNOSIS — E559 Vitamin D deficiency, unspecified: Secondary | ICD-10-CM | POA: Diagnosis not present

## 2019-08-06 DIAGNOSIS — Z9189 Other specified personal risk factors, not elsewhere classified: Secondary | ICD-10-CM

## 2019-08-06 DIAGNOSIS — R945 Abnormal results of liver function studies: Secondary | ICD-10-CM | POA: Diagnosis not present

## 2019-08-06 DIAGNOSIS — E669 Obesity, unspecified: Secondary | ICD-10-CM

## 2019-08-06 DIAGNOSIS — F3289 Other specified depressive episodes: Secondary | ICD-10-CM | POA: Diagnosis not present

## 2019-08-06 DIAGNOSIS — E6609 Other obesity due to excess calories: Secondary | ICD-10-CM

## 2019-08-06 DIAGNOSIS — Z6832 Body mass index (BMI) 32.0-32.9, adult: Secondary | ICD-10-CM

## 2019-08-06 DIAGNOSIS — E66811 Obesity, class 1: Secondary | ICD-10-CM

## 2019-08-06 MED ORDER — BUPROPION HCL ER (SR) 200 MG PO TB12: 200.0000 mg | ORAL_TABLET | Freq: Every day | ORAL | 0 refills | Status: DC

## 2019-08-07 NOTE — Progress Notes (Signed)
Office: [phone]  /  Fax: [phone]   HPI:   Chief Complaint: OBESITY Rhonda Bradley is here to discuss her progress with her obesity treatment plan. She is on the  follow the Category 3 plan and is following her eating plan approximately 90 % of the time. She states she is walking 20 to 30 minutes 3 times per week and swimming 20 to 30 minutes 3 times per week. Rhonda Bradley is down 4 pounds. She did not have a hard time. Rhonda Bradley is getting adequate water. Her weight is 199 lb (90.3 kg) today and has had a weight loss of 4 pounds over a period of 2 weeks since her last visit. She has lost 4 lbs since starting treatment with [place].  Diabetes II Rhonda Bradley has a diagnosis of diabetes type II. Rhonda Bradley states she has occasional high blood sugar to 190. Last A1c was at 7.9 Rhonda Bradley is taking metformin currently. She has been working on intensive lifestyle modifications including diet, exercise, and weight loss to help control her blood glucose levels.  Elevated LFTs Rhonda Bradley has a diagnosis of elevated LFTs. She denies abdominal pain.  Vitamin D deficiency Rhonda Bradley has a diagnosis of vitamin D deficiency. Rhonda Bradley increased vit D to 1,000 units daily and she denies nausea, vomiting or muscle weakness.  Depression with emotional eating behaviors Rhonda Bradley is struggling with emotional eating and using food for comfort to the extent that it is negatively impacting her health. She often snacks when she is not hungry. Rhonda Bradley sometimes feels she is out of control and then feels guilty that she made poor food choices. Rhonda Bradley is taking Wellbutrin without any side effects. She has been working on behavior modification techniques to help reduce her emotional eating and has been somewhat successful. She shows no sign of suicidal or homicidal ideations.  ASSESSMENT AND PLAN:  Type 2 diabetes mellitus without complication, without long-term current use of insulin (HCC)  Elevated LFTs  Vitamin  D deficiency  Other depression - with emotional eating  - Plan: buPROPion (WELLBUTRIN SR) 200 MG 12 hr tablet  At risk for osteoporosis  Class 1 obesity with serious comorbidity and body mass index (BMI) of 32.0 to 32.9 in adult, unspecified obesity type  PLAN:  Diabetes II Rhonda Bradley has been given extensive diabetes education by myself today including ideal fasting and post-prandial blood glucose readings, individual ideal Hgb A1c goals  and hypoglycemia prevention. We discussed the importance of good blood sugar control to decrease the likelihood of diabetic complications such as nephropathy, neuropathy, limb loss, blindness, coronary artery disease, and death. We discussed the importance of intensive lifestyle modification including diet, exercise and weight loss as the first line treatment for diabetes. Rhonda Bradley will work on increasing lean protein and decreasing simple carbohydrates in her diet. She will continue her diabetes medications and she will check her fasting blood sugars and 2 hour post prandial blood sugars. Rhonda Bradley will continue her exercise and she will follow up at the agreed upon time.  Elevated LFTs We discussed weight loss of 5 to 10 percent of her body weight at least and resistance exercise and cardio exercise of up to 150 minutes. Rhonda Bradley agreed to continue with her weight loss efforts with healthier diet and exercise as an essential part of her treatment plan.  Vitamin D Deficiency Rhonda Bradley was informed that low vitamin D levels contributes to fatigue and are associated with obesity, breast, and colon cancer. She will continue to take OTC vitamin D and she will follow up for  routine testing of vitamin D, at least 2-3 times per year. She was informed of the risk of over-replacement of vitamin D and agrees to not increase her dose unless she discusses this with [place] first.  Depression with Emotional Eating Behaviors We discussed behavior modification techniques today  to help Rhonda Bradley deal with her emotional eating and depression. She has agreed to change Wellbutrin SR to 200 mg daily #30 with no refills and discontinue 150 mg. Rhonda Bradley agrees to follow up as directed.  Obesity Rhonda Bradley is currently in the action stage of change. As such, her goal is to continue with weight loss efforts She has agreed to follow the Category 3 plan Rhonda Bradley will continue her exercise regimen for weight loss and overall health benefits. We discussed the following Behavioral Modification Strategies today: planning for success, increase H2O intake, no skipping meals, keeping healthy foods in the home, increasing lean protein intake, decreasing simple carbohydrates, increasing vegetables, decrease eating out and work on meal planning and intentional eating  Rhonda Bradley has agreed to follow up with our clinic in 2 weeks. She was informed of the importance of frequent follow up visits to maximize her success with intensive lifestyle modifications for her multiple health conditions.  ALLERGIES: Allergies  Allergen Reactions  . Erythromycin Nausea And Vomiting  . Prednisone   . Flagyl [Metronidazole] Rash  . Penicillins Rash    MEDICATIONS: Current Outpatient Medications on File Prior to Visit  Medication Sig Dispense Refill  . albuterol (PROVENTIL HFA;VENTOLIN HFA) 108 (90 Base) MCG/ACT inhaler Inhale 2 puffs into the lungs every 4 (four) hours as needed for wheezing or shortness of breath. (Patient not taking: Reported on 08/06/2019) 1 Inhaler 2  . Azelastine-Fluticasone (DYMISTA) 137-50 MCG/ACT SUSP Place 2 sprays into both nostrils daily. (Patient not taking: Reported on 08/06/2019) 1 Bottle 5  . Black Pepper-Turmeric (TURMERIC COMPLEX/BLACK PEPPER PO) Take by mouth.    . budesonide-formoterol (SYMBICORT) 160-4.5 MCG/ACT inhaler INHALE TWO PUFFS TWICE DAILY TO PREVENT COUGH OR WHEEZE. RINSE MOUTH AFTER USE. (Patient not taking: Reported on 08/06/2019) 30.6 g 1  . calcium  carbonate (OS-CAL - DOSED IN MG OF ELEMENTAL CALCIUM) 1250 MG tablet Take 2 tablets (1,000 mg of elemental calcium total) by mouth 2 (two) times daily with a meal. (Patient not taking: Reported on 08/06/2019) 60 tablet 0  . Cholecalciferol (VITAMIN D3) 25 MCG (1000 UT) CAPS Take by mouth.    . COLLAGEN PO Take by mouth.    . cyclobenzaprine (FLEXERIL) 5 MG tablet Take 5 mg by mouth 3 (three) times daily as needed for muscle spasms.    . diphenhydramine-acetaminophen (TYLENOL PM) 25-500 MG TABS Take 2 tablets by mouth at bedtime as needed (sleep).    [name] esomeprazole (NEXIUM) 40 MG capsule Take 1 capsule (40 mg total) by mouth 2 (two) times daily. (Patient not taking: Reported on 08/06/2019) 180 capsule 1  . furosemide (LASIX) 40 MG tablet Take 80 mg by mouth daily. Patient takes as needed per patient     . glucosamine-chondroitin 500-400 MG tablet Take 1 tablet by mouth 3 (three) times daily.    . irbesartan (AVAPRO) 75 MG tablet Take 75 mg by mouth daily.    [name] levothyroxine (SYNTHROID, LEVOTHROID) 100 MCG tablet TAKE 1 TABLET ONCE DAILY ON AN EMPTY STOMACH 30 MINUTES BEFORE BREAKFAST  2  . liothyronine (CYTOMEL) 5 MCG tablet TAKE 1 TABLET BY MOUTH TWICE A DAY ON AN EMPTY STOMACH  5  . loratadine (CLARITIN) 10 MG tablet Take 10  mg by mouth every morning.    [name] losartan (COZAAR) 50 MG tablet Take 50 mg by mouth daily.  2  . magnesium gluconate (MAGONATE) 500 MG tablet Take 500 mg by mouth 2 (two) times daily.    . meloxicam (MOBIC) 15 MG tablet 1 TABLET ONCE A DAY AS NEEDED ORALLY 30  3  . metFORMIN (GLUCOPHAGE-XR) 500 MG 24 hr tablet 3 TABLETS WITH EVENING MEAL ONCE A DAY ORALLY 30 DAY(S)  11  . montelukast (SINGULAIR) 10 MG tablet Take 1 tablet (10 mg total) by mouth at bedtime. (Patient not taking: Reported on 08/06/2019) 30 tablet 5  . PREMARIN vaginal cream PLEASE SEE ATTACHED FOR DETAILED DIRECTIONS    . ranitidine (ZANTAC) 300 MG capsule Take 1 capsule (300 mg total) by mouth every evening.  (Patient not taking: Reported on 08/06/2019) 30 capsule 5  . rosuvastatin (CRESTOR) 5 MG tablet     . simethicone (MYLICON) 0000000 MG chewable tablet Chew 125 mg by mouth every 6 (six) hours as needed for flatulence.    [name] tiZANidine (ZANAFLEX) 4 MG tablet      No current facility-administered medications on file prior to visit.     PAST MEDICAL HISTORY: Past Medical History:  Diagnosis Date  . Anemia    as a child   . Anxiety   . Asthma   . Back pain   . Chronic fatigue syndrome   . Complication of anesthesia    slow to wake up age 22, but since cholecystectomy and no problems   . Constipation   . Depression   . Diabetes ([place])   . Family history of anesthesia complication    father slow to wake up   . Fatty liver   . Fibromyalgia   . GERD (gastroesophageal reflux disease)   . Gestational diabetes   . Headache   . High blood pressure   . High cholesterol   . Hypertension   . IBS (irritable bowel syndrome)   . Joint pain   . Lactose intolerance   . Osteopenia   . SOB (shortness of breath)     PAST SURGICAL HISTORY: Past Surgical History:  Procedure Laterality Date  . BREAST BIOPSY    . BREAST EXCISIONAL BIOPSY    . CESAREAN SECTION     x 3  . CHOLECYSTECTOMY    . THYROIDECTOMY N/A 09/19/2014   Procedure: THYROIDECTOMY;  Surgeon: Armandina Gemma, MD;  Location: [facility];  Service: General;  Laterality: N/A;    SOCIAL HISTORY: Social History   Tobacco Use  . Smoking status: Never Smoker  . Smokeless tobacco: Never Used  Substance Use Topics  . Alcohol use: Yes    Comment: socially   . Drug use: No    FAMILY HISTORY: Family History  Problem Relation Age of Onset  . Breast cancer Maternal Aunt   . Allergic rhinitis Mother   . Asthma Mother   . High blood pressure Mother   . High Cholesterol Mother   . Depression Mother   . Anxiety disorder Mother   . Alcoholism Mother   . Eczema Father   . High blood pressure Father   . High Cholesterol Father   . Heart  disease Father   . Depression Father   . Liver disease Father   . Alcoholism Father   . AAA (abdominal aortic aneurysm) Father   . Angioedema Maternal Grandfather   . Urticaria Maternal Grandfather     ROS: Review of Systems  Constitutional: Positive  for weight loss.  Gastrointestinal: Negative for abdominal pain, nausea and vomiting.  Musculoskeletal:       Negative for muscle weakness  Psychiatric/Behavioral: Positive for depression. Negative for suicidal ideas.    PHYSICAL EXAM: Blood pressure 96/68, pulse 97, temperature (!) 97 F (36.1 C), temperature source Oral, height 5\' 6"  (1.676 m), weight 199 lb (90.3 kg), SpO2 95 %. Body mass index is 32.12 kg/m. Physical Exam Vitals signs reviewed.  Constitutional:      Appearance: Normal appearance. She is well-developed. She is obese.  Cardiovascular:     Rate and Rhythm: Normal rate.  Pulmonary:     Effort: Pulmonary effort is normal.  Musculoskeletal: Normal range of motion.  Skin:    General: Skin is warm and dry.  Neurological:     Mental Status: She is alert and oriented to person, place, and time.  Psychiatric:        Mood and Affect: Mood normal.        Behavior: Behavior normal.        Thought Content: Thought content does not include homicidal or suicidal ideation.     RECENT LABS AND TESTS: BMET    Component Value Date/Time   NA 142 07/05/2019 1048   K 4.7 07/05/2019 1048   CL 99 07/05/2019 1048   CO2 22 07/05/2019 1048   GLUCOSE 167 (H) 07/05/2019 1048   GLUCOSE 192 (H) 09/20/2014 0445   BUN 12 07/05/2019 1048   CREATININE 0.65 07/05/2019 1048   CALCIUM 10.1 07/05/2019 1048   GFRNONAA 101 07/05/2019 1048   GFRAA 116 07/05/2019 1048   No results found for: HGBA1C No results found for: INSULIN CBC    Component Value Date/Time   WBC 8.1 01/30/2019 1808   WBC 9.2 09/12/2014 1515   RBC 5.14 01/30/2019 1808   RBC 4.88 09/12/2014 1515   HGB 14.7 01/30/2019 1808   HCT 43.2 01/30/2019 1808   PLT  363 01/30/2019 1808   MCV 84 01/30/2019 1808   MCH 28.6 01/30/2019 1808   MCH 29.9 09/12/2014 1515   MCHC 34.0 01/30/2019 1808   MCHC 33.7 09/12/2014 1515   RDW 13.3 01/30/2019 1808   LYMPHSABS 3.5 (H) 01/30/2019 1808   MONOABS 0.5 09/05/2010 2335   EOSABS 0.1 01/30/2019 1808   BASOSABS 0.1 01/30/2019 1808   Iron/TIBC/Ferritin/ %Sat No results found for: IRON, TIBC, FERRITIN, IRONPCTSAT Lipid Panel     Component Value Date/Time   CHOL 172 07/05/2019 1048   TRIG 124 07/05/2019 1048   HDL 42 07/05/2019 1048   LDLCALC 105 (H) 07/05/2019 1048   Hepatic Function Panel     Component Value Date/Time   PROT 7.2 07/05/2019 1048   ALBUMIN 5.1 (H) 07/05/2019 1048   AST 26 07/05/2019 1048   ALT 56 (H) 07/05/2019 1048   ALKPHOS 59 07/05/2019 1048   BILITOT 0.5 07/05/2019 1048   No results found for: TSH    Ref. Range 07/05/2019 10:48  Vitamin D, 25-Hydroxy Latest Ref Range: 30.0 - 100.0 ng/mL 47.1    OBESITY BEHAVIORAL INTERVENTION VISIT  Today's visit was # 3   Starting weight: 203 lbs Starting date: 07/05/2019 Today's weight : 199 lbs  Today's date: 08/06/2019 Total lbs lost to date: 4    08/06/2019  Height 5\' 6"  (1.676 m)  Weight 199 lb (90.3 kg)  BMI (Calculated) 32.13  BLOOD PRESSURE - SYSTOLIC 96  BLOOD PRESSURE - DIASTOLIC 68   Body Fat % 0000000 %  Total Body Water (lbs)  77.8 lbs    ASK: We discussed the diagnosis of obesity with Rhonda Bradley today and Rhonda Bradley agreed to give [place] permission to discuss obesity behavioral modification therapy today.  ASSESS: Rhonda Bradley has the diagnosis of obesity and her BMI today is 32.13 Rhonda Bradley is in the action stage of change   ADVISE: Rhonda Bradley was educated on the multiple health risks of obesity as well as the benefit of weight loss to improve her health. She was advised of the need for long term treatment and the importance of lifestyle modifications to improve her current health and to decrease her risk of future  health problems.  AGREE: Multiple dietary modification options and treatment options were discussed and  [place] agreed to follow the recommendations documented in the above note.  ARRANGE: Rhonda Bradley was educated on the importance of frequent visits to treat obesity as outlined per CMS and USPSTF guidelines and agreed to schedule her next follow up appointment today.  Corey Skains, am acting as Location manager for [employer]. Owens Shark, DO  I have reviewed the above documentation for accuracy and completeness, and I agree with the above. -Jearld Lesch, DO

## 2019-08-08 DIAGNOSIS — E6609 Other obesity due to excess calories: Secondary | ICD-10-CM | POA: Insufficient documentation

## 2019-08-08 DIAGNOSIS — Z6832 Body mass index (BMI) 32.0-32.9, adult: Secondary | ICD-10-CM | POA: Insufficient documentation

## 2019-08-23 ENCOUNTER — Encounter (INDEPENDENT_AMBULATORY_CARE_PROVIDER_SITE_OTHER): Payer: Self-pay

## 2019-08-27 ENCOUNTER — Ambulatory Visit (INDEPENDENT_AMBULATORY_CARE_PROVIDER_SITE_OTHER): Payer: 59 | Admitting: Bariatrics

## 2019-08-27 ENCOUNTER — Other Ambulatory Visit: Payer: Self-pay

## 2019-08-27 ENCOUNTER — Encounter (INDEPENDENT_AMBULATORY_CARE_PROVIDER_SITE_OTHER): Payer: Self-pay | Admitting: Bariatrics

## 2019-08-27 VITALS — BP 124/77 | HR 100 | Temp 97.9°F | Ht 66.0 in | Wt 198.0 lb

## 2019-08-27 DIAGNOSIS — Z6832 Body mass index (BMI) 32.0-32.9, adult: Secondary | ICD-10-CM

## 2019-08-27 DIAGNOSIS — E669 Obesity, unspecified: Secondary | ICD-10-CM

## 2019-08-27 DIAGNOSIS — E119 Type 2 diabetes mellitus without complications: Secondary | ICD-10-CM | POA: Diagnosis not present

## 2019-08-27 DIAGNOSIS — F3289 Other specified depressive episodes: Secondary | ICD-10-CM | POA: Diagnosis not present

## 2019-08-27 DIAGNOSIS — E559 Vitamin D deficiency, unspecified: Secondary | ICD-10-CM

## 2019-08-29 ENCOUNTER — Encounter (INDEPENDENT_AMBULATORY_CARE_PROVIDER_SITE_OTHER): Payer: Self-pay | Admitting: Bariatrics

## 2019-08-29 NOTE — Progress Notes (Signed)
Office: [phone]  /  Fax: [phone]   HPI:   Chief Complaint: OBESITY Rhonda Bradley is here to discuss her progress with her obesity treatment plan. She is on the Category 3 plan and is following her eating plan approximately 50% of the time. She states she is walking 30 minutes 3 times per week and doing weights 15 minutes 2 times per week. Rhonda Bradley is down 1 lb and doing well overall. She reports getting adequate water intake.  Her weight is 198 lb (89.8 kg) today and has had a weight loss of 1 pound over a period of 3 weeks since her last visit. She has lost 5 lbs since starting treatment with [place].  Depression with emotional eating behaviors Rhonda Bradley is struggling with emotional eating and using food for comfort to the extent that it is negatively impacting her health. She often snacks when she is not hungry. Rhonda Bradley sometimes feels she is out of control and then feels guilty that she made poor food choices. She has been working on behavior modification techniques to help reduce her emotional eating and has been somewhat successful. Rhonda Bradley is taking Wellbutrin and reports increased energy and focus. She shows no sign of suicidal or homicidal ideations.  Depression screen PHQ 2/9 07/05/2019  Decreased Interest 1  Down, Depressed, Hopeless 1  PHQ - 2 Score 2  Altered sleeping 0  Tired, decreased energy 1  Change in appetite 3  Feeling bad or failure about yourself  0  Trouble concentrating 3  Moving slowly or fidgety/restless 0  Suicidal thoughts 0  PHQ-9 Score 9  Difficult doing work/chores Extremely dIfficult   Vitamin D deficiency Rhonda Bradley has a diagnosis of Vitamin D deficiency. She is currently taking OTC Vit D and denies nausea, vomiting or muscle weakness.  Diabetes Mellitus without complications, no long-term complications Rhonda Bradley has a diagnosis of diabetes mellitus. Rhonda Bradley does not report checking blood sugars. Last A1c was reported to be 7.9. She has been  working on intensive lifestyle modifications including diet, exercise, and weight loss to help control her blood glucose levels.  ASSESSMENT AND PLAN:  Vitamin D deficiency  Type 2 diabetes mellitus without complication, without long-term current use of insulin (HCC)  Other depression  Class 1 obesity with serious comorbidity and body mass index (BMI) of 32.0 to 32.9 in adult, unspecified obesity type  PLAN:  Depression with Emotional Eating Behaviors We discussed behavior modification techniques today to help Rhonda Bradley deal with her emotional eating and depression. Rhonda Bradley will continue Wellbutrin and agrees to follow-up as directed.  Vitamin D Deficiency Rhonda Bradley was informed that low Vitamin D levels contributes to fatigue and are associated with obesity, breast, and colon cancer. She agrees to continue taking OTC Vit D 2,000 IU and will follow-up for routine testing of Vitamin D, at least 2-3 times per year. She was informed of the risk of over-replacement of Vitamin D and agrees to not increase her dose unless she discusses this with [place] first. Rhonda Bradley agrees to follow-up with our clinic in 2 weeks.  Diabetes Mellitus without complications, no long-term complications Rhonda Bradley has been given extensive diabetes education by myself today including ideal fasting and post-prandial blood glucose readings, individual ideal HgA1c goals  and hypoglycemia prevention. We discussed the importance of good blood sugar control to decrease the likelihood of diabetic complications such as nephropathy, neuropathy, limb loss, blindness, coronary artery disease, and death. We discussed the importance of intensive lifestyle modification including diet, exercise and weight loss as the first line  treatment for diabetes. Rhonda Bradley agrees to continue metformin and will follow-up at the agreed upon time.  I spent > than 50% of the 15 minute visit on counseling as documented in the note.  Obesity Rhonda Bradley  is currently in the action stage of change. As such, her goal is to continue with weight loss efforts. She has agreed to follow the Category 3 plan. Rhonda Bradley will work on meal planning, intentional eating, and increasing her protein intake. Rhonda Bradley has been instructed to work up to a goal of 150 minutes of combined cardio and strengthening exercise per week for weight loss and overall health benefits. We discussed the following Behavioral Modification Strategies today: increasing lean protein intake, decreasing simple carbohydrates, increasing vegetables, increase H20 intake, decrease eating out, no skipping meals, work on meal planning and easy cooking plans, keeping healthy foods in the home, and planning for success.  Rhonda Bradley has agreed to follow-up with our clinic in 2 weeks. She was informed of the importance of frequent follow-up visits to maximize her success with intensive lifestyle modifications for her multiple health conditions.  ALLERGIES: Allergies  Allergen Reactions  . Erythromycin Nausea And Vomiting  . Prednisone   . Flagyl [Metronidazole] Rash  . Penicillins Rash    MEDICATIONS: Current Outpatient Medications on File Prior to Visit  Medication Sig Dispense Refill  . albuterol (PROVENTIL HFA;VENTOLIN HFA) 108 (90 Base) MCG/ACT inhaler Inhale 2 puffs into the lungs every 4 (four) hours as needed for wheezing or shortness of breath. 1 Inhaler 2  . Azelastine-Fluticasone (DYMISTA) 137-50 MCG/ACT SUSP Place 2 sprays into both nostrils daily. 1 Bottle 5  . Black Pepper-Turmeric (TURMERIC COMPLEX/BLACK PEPPER PO) Take by mouth.    . budesonide-formoterol (SYMBICORT) 160-4.5 MCG/ACT inhaler INHALE TWO PUFFS TWICE DAILY TO PREVENT COUGH OR WHEEZE. RINSE MOUTH AFTER USE. 30.6 g 1  . buPROPion (WELLBUTRIN SR) 200 MG 12 hr tablet Take 1 tablet (200 mg total) by mouth daily. 30 tablet 0  . calcium carbonate (OS-CAL - DOSED IN MG OF ELEMENTAL CALCIUM) 1250 MG tablet Take 2 tablets  (1,000 mg of elemental calcium total) by mouth 2 (two) times daily with a meal. 60 tablet 0  . Cholecalciferol (VITAMIN D3) 25 MCG (1000 UT) CAPS Take by mouth.    . COLLAGEN PO Take by mouth.    . cyclobenzaprine (FLEXERIL) 5 MG tablet Take 5 mg by mouth 3 (three) times daily as needed for muscle spasms.    . diphenhydramine-acetaminophen (TYLENOL PM) 25-500 MG TABS Take 2 tablets by mouth at bedtime as needed (sleep).    [name] esomeprazole (NEXIUM) 40 MG capsule Take 1 capsule (40 mg total) by mouth 2 (two) times daily. 180 capsule 1  . furosemide (LASIX) 40 MG tablet Take 80 mg by mouth daily. Patient takes as needed per patient     . glucosamine-chondroitin 500-400 MG tablet Take 1 tablet by mouth 3 (three) times daily.    . irbesartan (AVAPRO) 75 MG tablet Take 75 mg by mouth daily.    [name] levothyroxine (SYNTHROID, LEVOTHROID) 100 MCG tablet TAKE 1 TABLET ONCE DAILY ON AN EMPTY STOMACH 30 MINUTES BEFORE BREAKFAST  2  . liothyronine (CYTOMEL) 5 MCG tablet TAKE 1 TABLET BY MOUTH TWICE A DAY ON AN EMPTY STOMACH  5  . loratadine (CLARITIN) 10 MG tablet Take 10 mg by mouth every morning.    [name] losartan (COZAAR) 50 MG tablet Take 50 mg by mouth daily.  2  . magnesium gluconate (MAGONATE) 500 MG tablet Take  500 mg by mouth 2 (two) times daily.    . meloxicam (MOBIC) 15 MG tablet 1 TABLET ONCE A DAY AS NEEDED ORALLY 30  3  . metFORMIN (GLUCOPHAGE-XR) 500 MG 24 hr tablet 3 TABLETS WITH EVENING MEAL ONCE A DAY ORALLY 30 DAY(S)  11  . montelukast (SINGULAIR) 10 MG tablet Take 1 tablet (10 mg total) by mouth at bedtime. 30 tablet 5  . PREMARIN vaginal cream PLEASE SEE ATTACHED FOR DETAILED DIRECTIONS    . ranitidine (ZANTAC) 300 MG capsule Take 1 capsule (300 mg total) by mouth every evening. 30 capsule 5  . rosuvastatin (CRESTOR) 5 MG tablet     . simethicone (MYLICON) 0000000 MG chewable tablet Chew 125 mg by mouth every 6 (six) hours as needed for flatulence.    [name] tiZANidine (ZANAFLEX) 4 MG tablet      No  current facility-administered medications on file prior to visit.     PAST MEDICAL HISTORY: Past Medical History:  Diagnosis Date  . Anemia    as a child   . Anxiety   . Asthma   . Back pain   . Chronic fatigue syndrome   . Complication of anesthesia    slow to wake up age 63, but since cholecystectomy and no problems   . Constipation   . Depression   . Diabetes ([facility])   . Family history of anesthesia complication    father slow to wake up   . Fatty liver   . Fibromyalgia   . GERD (gastroesophageal reflux disease)   . Gestational diabetes   . Headache   . High blood pressure   . High cholesterol   . Hypertension   . IBS (irritable bowel syndrome)   . Joint pain   . Lactose intolerance   . Osteopenia   . SOB (shortness of breath)     PAST SURGICAL HISTORY: Past Surgical History:  Procedure Laterality Date  . BREAST BIOPSY    . BREAST EXCISIONAL BIOPSY    . CESAREAN SECTION     x 3  . CHOLECYSTECTOMY    . THYROIDECTOMY N/A 09/19/2014   Procedure: THYROIDECTOMY;  Surgeon: Armandina Gemma, MD;  Location: [facility];  Service: General;  Laterality: N/A;    SOCIAL HISTORY: Social History   Tobacco Use  . Smoking status: Never Smoker  . Smokeless tobacco: Never Used  Substance Use Topics  . Alcohol use: Yes    Comment: socially   . Drug use: No    FAMILY HISTORY: Family History  Problem Relation Age of Onset  . Breast cancer Maternal Aunt   . Allergic rhinitis Mother   . Asthma Mother   . High blood pressure Mother   . High Cholesterol Mother   . Depression Mother   . Anxiety disorder Mother   . Alcoholism Mother   . Eczema Father   . High blood pressure Father   . High Cholesterol Father   . Heart disease Father   . Depression Father   . Liver disease Father   . Alcoholism Father   . AAA (abdominal aortic aneurysm) Father   . Angioedema Maternal Grandfather   . Urticaria Maternal Grandfather    ROS: Review of Systems  Gastrointestinal: Negative for  nausea and vomiting.  Musculoskeletal:       Negative for muscle weakness.  Psychiatric/Behavioral: Positive for depression (emotional eating). Negative for suicidal ideas.       Negative for homicidal ideas.   PHYSICAL EXAM: Blood pressure 124/77,  pulse 100, temperature 97.9 F (36.6 C), temperature source Oral, height 5\' 6"  (1.676 m), weight 198 lb (89.8 kg), SpO2 96 %. Body mass index is 31.96 kg/m. Physical Exam Vitals signs reviewed.  Constitutional:      Appearance: Normal appearance. She is obese.  Cardiovascular:     Rate and Rhythm: Normal rate.     Pulses: Normal pulses.  Pulmonary:     Effort: Pulmonary effort is normal.     Breath sounds: Normal breath sounds.  Musculoskeletal: Normal range of motion.  Skin:    General: Skin is warm and dry.  Neurological:     Mental Status: She is alert and oriented to person, place, and time.  Psychiatric:        Behavior: Behavior normal.   RECENT LABS AND TESTS: BMET    Component Value Date/Time   NA 142 07/05/2019 1048   K 4.7 07/05/2019 1048   CL 99 07/05/2019 1048   CO2 22 07/05/2019 1048   GLUCOSE 167 (H) 07/05/2019 1048   GLUCOSE 192 (H) 09/20/2014 0445   BUN 12 07/05/2019 1048   CREATININE 0.65 07/05/2019 1048   CALCIUM 10.1 07/05/2019 1048   GFRNONAA 101 07/05/2019 1048   GFRAA 116 07/05/2019 1048   No results found for: HGBA1C No results found for: INSULIN CBC    Component Value Date/Time   WBC 8.1 01/30/2019 1808   WBC 9.2 09/12/2014 1515   RBC 5.14 01/30/2019 1808   RBC 4.88 09/12/2014 1515   HGB 14.7 01/30/2019 1808   HCT 43.2 01/30/2019 1808   PLT 363 01/30/2019 1808   MCV 84 01/30/2019 1808   MCH 28.6 01/30/2019 1808   MCH 29.9 09/12/2014 1515   MCHC 34.0 01/30/2019 1808   MCHC 33.7 09/12/2014 1515   RDW 13.3 01/30/2019 1808   LYMPHSABS 3.5 (H) 01/30/2019 1808   MONOABS 0.5 09/05/2010 2335   EOSABS 0.1 01/30/2019 1808   BASOSABS 0.1 01/30/2019 1808   Iron/TIBC/Ferritin/ %Sat No results  found for: IRON, TIBC, FERRITIN, IRONPCTSAT Lipid Panel     Component Value Date/Time   CHOL 172 07/05/2019 1048   TRIG 124 07/05/2019 1048   HDL 42 07/05/2019 1048   LDLCALC 105 (H) 07/05/2019 1048   Hepatic Function Panel     Component Value Date/Time   PROT 7.2 07/05/2019 1048   ALBUMIN 5.1 (H) 07/05/2019 1048   AST 26 07/05/2019 1048   ALT 56 (H) 07/05/2019 1048   ALKPHOS 59 07/05/2019 1048   BILITOT 0.5 07/05/2019 1048   No results found for: TSH  Results for HAWLEY, FRYMYER (MRN [id number]) as of 08/29/2019 08:49  Ref. Range 07/05/2019 10:48  Vitamin D, 25-Hydroxy Latest Ref Range: 30.0 - 100.0 ng/mL 47.1   OBESITY BEHAVIORAL INTERVENTION VISIT  Today's visit was #4  Starting weight: 203 lbs Starting date: 07/05/2019 Today's weight: 198 lbs  Today's date: 08/27/2019 Total lbs lost to date: 5    08/27/2019  Height 5\' 6"  (1.676 m)  Weight 198 lb (89.8 kg)  BMI (Calculated) 31.97  BLOOD PRESSURE - SYSTOLIC [id number]  BLOOD PRESSURE - DIASTOLIC 77   Body Fat % 99991111 %  Total Body Water (lbs) 76.8 lbs   ASK: We discussed the diagnosis of obesity with Alcus Dad today and Dorca agreed to give [place] permission to discuss obesity behavioral modification therapy today.  ASSESS: Rissie has the diagnosis of obesity and her BMI today is 32.0. Angala is in the action stage of change.   ADVISE:  Tanai was educated on the multiple health risks of obesity as well as the benefit of weight loss to improve her health. She was advised of the need for long term treatment and the importance of lifestyle modifications to improve her current health and to decrease her risk of future health problems.  AGREE: Multiple dietary modification options and treatment options were discussed and  Zoee agreed to follow the recommendations documented in the above note.  ARRANGE: Zuriyah was educated on the importance of frequent visits to treat obesity as outlined per  CMS and USPSTF guidelines and agreed to schedule her next follow up appointment today.  Migdalia Dk, am acting as Location manager for [employer], DO  I have reviewed the above documentation for accuracy and completeness, and I agree with the above. -Jearld Lesch, DO

## 2019-08-31 ENCOUNTER — Other Ambulatory Visit (INDEPENDENT_AMBULATORY_CARE_PROVIDER_SITE_OTHER): Payer: Self-pay | Admitting: Bariatrics

## 2019-08-31 DIAGNOSIS — F3289 Other specified depressive episodes: Secondary | ICD-10-CM

## 2019-09-07 ENCOUNTER — Encounter (INDEPENDENT_AMBULATORY_CARE_PROVIDER_SITE_OTHER): Payer: Self-pay | Admitting: Bariatrics

## 2019-09-10 ENCOUNTER — Encounter (INDEPENDENT_AMBULATORY_CARE_PROVIDER_SITE_OTHER): Payer: Self-pay

## 2019-09-10 NOTE — Telephone Encounter (Signed)
Please review

## 2019-09-12 ENCOUNTER — Ambulatory Visit (INDEPENDENT_AMBULATORY_CARE_PROVIDER_SITE_OTHER): Payer: 59 | Admitting: Bariatrics

## 2019-10-16 ENCOUNTER — Other Ambulatory Visit: Payer: Self-pay | Admitting: Allergy and Immunology

## 2019-10-22 ENCOUNTER — Other Ambulatory Visit: Payer: Self-pay | Admitting: Geriatric Medicine

## 2019-10-22 DIAGNOSIS — Z1231 Encounter for screening mammogram for malignant neoplasm of breast: Secondary | ICD-10-CM

## 2019-12-05 ENCOUNTER — Other Ambulatory Visit: Payer: Self-pay | Admitting: Allergy and Immunology

## 2019-12-13 ENCOUNTER — Ambulatory Visit
Admission: RE | Admit: 2019-12-13 | Discharge: 2019-12-13 | Disposition: A | Discharge status: Home / Self Care | Payer: 59 | Source: Ambulatory Visit | Attending: Geriatric Medicine | Admitting: Geriatric Medicine

## 2019-12-13 ENCOUNTER — Other Ambulatory Visit: Payer: Self-pay

## 2019-12-13 DIAGNOSIS — Z1231 Encounter for screening mammogram for malignant neoplasm of breast: Secondary | ICD-10-CM

## 2019-12-31 ENCOUNTER — Other Ambulatory Visit: Payer: Self-pay | Admitting: Allergy and Immunology

## 2020-02-07 ENCOUNTER — Encounter: Payer: Self-pay | Admitting: Adult Health

## 2020-02-07 ENCOUNTER — Ambulatory Visit (INDEPENDENT_AMBULATORY_CARE_PROVIDER_SITE_OTHER): Payer: 59 | Admitting: Adult Health

## 2020-02-07 ENCOUNTER — Other Ambulatory Visit: Payer: Self-pay

## 2020-02-07 VITALS — BP 142/83 | HR 80 | Ht 66.0 in | Wt 192.0 lb

## 2020-02-07 DIAGNOSIS — F4320 Adjustment disorder, unspecified: Secondary | ICD-10-CM | POA: Diagnosis not present

## 2020-02-07 DIAGNOSIS — M797 Fibromyalgia: Secondary | ICD-10-CM

## 2020-02-07 DIAGNOSIS — F331 Major depressive disorder, recurrent, moderate: Secondary | ICD-10-CM

## 2020-02-07 DIAGNOSIS — F411 Generalized anxiety disorder: Secondary | ICD-10-CM | POA: Diagnosis not present

## 2020-02-07 DIAGNOSIS — G4700 Insomnia, unspecified: Secondary | ICD-10-CM

## 2020-02-07 MED ORDER — QUETIAPINE FUMARATE 25 MG PO TABS: ORAL_TABLET | ORAL | 5 refills | Status: DC

## 2020-02-07 MED ORDER — BUPROPION HCL ER (XL) 150 MG PO TB24: ORAL_TABLET | ORAL | 5 refills | Status: DC
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
## 2020-02-29 ENCOUNTER — Other Ambulatory Visit: Payer: Self-pay | Admitting: Adult Health

## 2020-02-29 DIAGNOSIS — F411 Generalized anxiety disorder: Secondary | ICD-10-CM

## 2020-02-29 DIAGNOSIS — M797 Fibromyalgia: Secondary | ICD-10-CM

## 2020-02-29 DIAGNOSIS — F331 Major depressive disorder, recurrent, moderate: Secondary | ICD-10-CM

## 2020-02-29 DIAGNOSIS — G4700 Insomnia, unspecified: Secondary | ICD-10-CM

## 2020-03-06 ENCOUNTER — Other Ambulatory Visit: Payer: Self-pay

## 2020-03-06 ENCOUNTER — Encounter: Payer: Self-pay | Admitting: Adult Health

## 2020-03-06 ENCOUNTER — Ambulatory Visit (INDEPENDENT_AMBULATORY_CARE_PROVIDER_SITE_OTHER): Payer: 59 | Admitting: Adult Health

## 2020-03-06 DIAGNOSIS — G4700 Insomnia, unspecified: Secondary | ICD-10-CM | POA: Diagnosis not present

## 2020-03-06 DIAGNOSIS — F331 Major depressive disorder, recurrent, moderate: Secondary | ICD-10-CM | POA: Diagnosis not present

## 2020-03-06 DIAGNOSIS — F411 Generalized anxiety disorder: Secondary | ICD-10-CM

## 2020-03-06 DIAGNOSIS — F4320 Adjustment disorder, unspecified: Secondary | ICD-10-CM

## 2020-03-06 NOTE — Progress Notes (Signed)
KATHEINE LEVELL [id number] July 06, 1966 54 y.o.  Subjective:   Patient ID:  Rhonda Bradley is a 54 y.o. (DOB 1966/06/02) female.  Chief Complaint: No chief complaint on file.   HPI Rhonda Bradley presents to the office today for follow-up of GAD, MDD, insomnia, and adjustment disorder.  Describes mood today as "ok". Pleasant. Denies tearfulness. Mood symptoms - denies depression, irritability, and anxiety. Decreased situational stressors. Quit job a month ago due to health issues. May get a job offer in May. Diagnosed with Fibromyalgia in 2000. Hoping to get the Fibromyalgia pain under control so she can do more. Improved interest and motivation. Taking medications as prescribed.  Energy levels vary with pain. Active, has a regular exercise routine 3 days a week - total gym at home . Walking in the evenings with husband. Enjoys some usual interests and activities. Married. Lives with husband of 8 years. Has 3 children - 2 boys and 1 girl - 2 grandchildren and due April. Family local.   Appetite adequate. Weight stable 192. Sleep has improved with Seroquel. Averages 8 hours. Napping during the day.   Focus and concentration difficulties. Trying to read more. Completing tasks. Managing aspects of household.  Denies SI or HI. Denies AH or VH.  Previous medication trials: Wellbutrin SR, Wellbutrin XL, Cymbalta, Gabapentin  PHQ2-9     Office Visit from 07/05/2019 in [facility]  PHQ-2 Total Score  2  PHQ-9 Total Score  9       Review of Systems:  Review of Systems  Musculoskeletal: Negative for gait problem.  Neurological: Negative for tremors.  Psychiatric/Behavioral:       Please refer to HPI    Medications: I have reviewed the patient's current medications.  Current Outpatient Medications  Medication Sig Dispense Refill  . albuterol (PROVENTIL HFA;VENTOLIN HFA) 108 (90 Base) MCG/ACT inhaler Inhale 2 puffs into the lungs every 4 (four) hours as  needed for wheezing or shortness of breath. 1 Inhaler 2  . alendronate (FOSAMAX) 70 MG tablet PLEASE SEE ATTACHED FOR DETAILED DIRECTIONS    . Azelastine-Fluticasone 137-50 MCG/ACT SUSP SPRAY 2 SPRAYS INTO EACH NOSTRIL EVERY DAY 23 g 0  . Black Pepper-Turmeric (TURMERIC COMPLEX/BLACK PEPPER PO) Take by mouth.    . budesonide-formoterol (SYMBICORT) 160-4.5 MCG/ACT inhaler INHALE TWO PUFFS TWICE DAILY TO PREVENT COUGH OR WHEEZE. RINSE MOUTH AFTER USE. 10.2 Inhaler 0  . buPROPion (WELLBUTRIN XL) 150 MG 24 hr tablet TAKE THREE TABLETS EVERY MORNING. 270 tablet 1  . Cholecalciferol (VITAMIN D3) 25 MCG (1000 UT) CAPS Take by mouth.    . COLLAGEN PO Take by mouth.    . cyclobenzaprine (FLEXERIL) 5 MG tablet Take 5 mg by mouth 3 (three) times daily as needed for muscle spasms.    [name] esomeprazole (NEXIUM) 40 MG capsule Take 40 mg by mouth 2 (two) times daily.    . furosemide (LASIX) 40 MG tablet Take 80 mg by mouth daily. Patient takes as needed per patient     . glucosamine-chondroitin 500-400 MG tablet Take 1 tablet by mouth 3 (three) times daily.    . irbesartan (AVAPRO) 75 MG tablet Take 75 mg by mouth daily.    [name] levothyroxine (SYNTHROID, LEVOTHROID) 100 MCG tablet TAKE 1 TABLET ONCE DAILY ON AN EMPTY STOMACH 30 MINUTES BEFORE BREAKFAST  2  . liothyronine (CYTOMEL) 5 MCG tablet TAKE 1 TABLET BY MOUTH TWICE A DAY ON AN EMPTY STOMACH  5  . loratadine (CLARITIN) 10 MG tablet Take  10 mg by mouth every morning.    . magnesium gluconate (MAGONATE) 500 MG tablet Take 500 mg by mouth 2 (two) times daily.    . meloxicam (MOBIC) 15 MG tablet 1 TABLET ONCE A DAY AS NEEDED ORALLY 30  3  . metFORMIN (GLUCOPHAGE-XR) 500 MG 24 hr tablet 3 TABLETS WITH EVENING MEAL ONCE A DAY ORALLY 30 DAY(S)  11  . montelukast (SINGULAIR) 10 MG tablet Take 1 tablet (10 mg total) by mouth at bedtime. 30 tablet 5  . PREMARIN vaginal cream PLEASE SEE ATTACHED FOR DETAILED DIRECTIONS    . QUEtiapine (SEROQUEL) 25 MG tablet TAKE ONE TO  THREE TABLETS AT BEDTIME FOR SLEEP. 270 tablet 1  . rosuvastatin (CRESTOR) 5 MG tablet     . simethicone (MYLICON) 0000000 MG chewable tablet Chew 125 mg by mouth every 6 (six) hours as needed for flatulence.    [name] tiZANidine (ZANAFLEX) 4 MG tablet     . YUVAFEM 10 MCG TABS vaginal tablet Place 1 tablet vaginally 2 (two) times a week.     No current facility-administered medications for this visit.    Medication Side Effects: None  Allergies:  Allergies  Allergen Reactions  . Erythromycin Nausea And Vomiting  . Prednisone   . Flagyl [Metronidazole] Rash  . Penicillins Rash    Past Medical History:  Diagnosis Date  . Anemia    as a child   . Anxiety   . Asthma   . Back pain   . Chronic fatigue syndrome   . Complication of anesthesia    slow to wake up age 83, but since cholecystectomy and no problems   . Constipation   . Depression   . Diabetes ([place])   . Family history of anesthesia complication    father slow to wake up   . Fatty liver   . Fibromyalgia   . GERD (gastroesophageal reflux disease)   . Gestational diabetes   . Headache   . High blood pressure   . High cholesterol   . Hypertension   . IBS (irritable bowel syndrome)   . Joint pain   . Lactose intolerance   . Osteopenia   . SOB (shortness of breath)     Family History  Problem Relation Age of Onset  . Breast cancer Maternal Aunt   . Allergic rhinitis Mother   . Asthma Mother   . High blood pressure Mother   . High Cholesterol Mother   . Depression Mother   . Anxiety disorder Mother   . Alcoholism Mother   . Eczema Father   . High blood pressure Father   . High Cholesterol Father   . Heart disease Father   . Depression Father   . Liver disease Father   . Alcoholism Father   . AAA (abdominal aortic aneurysm) Father   . Angioedema Maternal Grandfather   . Urticaria Maternal Grandfather     Social History   Socioeconomic History  . Marital status: Married    Spouse name: Rush Landmark  . Number of  children: Not on file  . Years of education: Not on file  . Highest education level: Not on file  Occupational History  . 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 Smoking status: Never Smoker  . Smokeless tobacco: Never Used  Substance and Sexual Activity  . Alcohol use: Yes    Comment: socially   . Drug use: No  . Sexual activity: Not on file  Other  Topics Concern  . Not on file  Social History Narrative  . Not on file   Social Determinants of Health   Financial Resource Strain:   . Difficulty of Paying Living Expenses:   Food Insecurity:   . Worried About Charity fundraiser in the Last Year:   . Arboriculturist in the Last Year:   Transportation Needs:   . Film/video editor (Medical):   [name] Lack of Transportation (Non-Medical):   Physical Activity:   . Days of Exercise per Week:   . Minutes of Exercise per Session:   Stress:   . Feeling of Stress :   Social Connections:   . Frequency of Communication with Friends and Family:   . Frequency of Social Gatherings with Friends and Family:   . Attends Religious Services:   . Active Member of Clubs or Organizations:   . Attends Archivist Meetings:   [name] Marital Status:   Intimate Partner Violence:   . Fear of Current or Ex-Partner:   . Emotionally Abused:   [name] Physically Abused:   . Sexually Abused:     Past Medical History, Surgical history, Social history, and Family history were reviewed and updated as appropriate.   Please see review of systems for further details on the patient's review from today.   Objective:   Physical Exam:  There were no vitals taken for this visit.  Physical Exam Constitutional:      General: She is not in acute distress. Musculoskeletal:        General: No deformity.  Neurological:     Mental Status: She is alert and oriented to person, place, and time.     Coordination: Coordination normal.  Psychiatric:        Attention and  Perception: Attention and perception normal. She does not perceive auditory or visual hallucinations.        Mood and Affect: Mood normal. Mood is not anxious or depressed. Affect is not labile, blunt, angry or inappropriate.        Speech: Speech normal.        Behavior: Behavior normal.        Thought Content: Thought content normal. Thought content is not paranoid or delusional. Thought content does not include homicidal or suicidal ideation. Thought content does not include homicidal or suicidal plan.        Cognition and Memory: Cognition and memory normal.        Judgment: Judgment normal.     Comments: Insight intact     Lab Review:     Component Value Date/Time   NA 142 07/05/2019 1048   K 4.7 07/05/2019 1048   CL 99 07/05/2019 1048   CO2 22 07/05/2019 1048   GLUCOSE 167 (H) 07/05/2019 1048   GLUCOSE 192 (H) 09/20/2014 0445   BUN 12 07/05/2019 1048   CREATININE 0.65 07/05/2019 1048   CALCIUM 10.1 07/05/2019 1048   PROT 7.2 07/05/2019 1048   ALBUMIN 5.1 (H) 07/05/2019 1048   AST 26 07/05/2019 1048   ALT 56 (H) 07/05/2019 1048   ALKPHOS 59 07/05/2019 1048   BILITOT 0.5 07/05/2019 1048   GFRNONAA 101 07/05/2019 1048   GFRAA 116 07/05/2019 1048       Component Value Date/Time   WBC 8.1 01/30/2019 1808   WBC 9.2 09/12/2014 1515   RBC 5.14 01/30/2019 1808   RBC 4.88 09/12/2014 1515   HGB 14.7 01/30/2019 1808   HCT 43.2 01/30/2019 1808  PLT 363 01/30/2019 1808   MCV 84 01/30/2019 1808   MCH 28.6 01/30/2019 1808   MCH 29.9 09/12/2014 1515   MCHC 34.0 01/30/2019 1808   MCHC 33.7 09/12/2014 1515   RDW 13.3 01/30/2019 1808   LYMPHSABS 3.5 (H) 01/30/2019 1808   MONOABS 0.5 09/05/2010 2335   EOSABS 0.1 01/30/2019 1808   BASOSABS 0.1 01/30/2019 1808    No results found for: POCLITH, LITHIUM   No results found for: PHENYTOIN, PHENOBARB, VALPROATE, CBMZ   .res Assessment: Plan:    Plan:  PDMP reviewed  1. Wellbutrin XL 450mg  daily 2. Seroquel 25mg  - 1 to 3  tablets at bedtime.  RTC 3 months   Patient advised to contact office with any questions, adverse effects, or acute worsening in signs and symptoms.  Discussed potential metabolic side effects associated with atypical antipsychotics, as well as potential risk for movement side effects. Advised pt to contact office if movement side effects occur.   Diagnoses and all orders for this visit:  Insomnia, unspecified type  Major depressive disorder, recurrent episode, moderate (HCC)  Generalized anxiety disorder  Adjustment disorder, unspecified type     Please see After Visit Summary for patient specific instructions.  No future appointments.  No orders of the defined types were placed in this encounter.   -------------------------------

## 2020-04-22 DIAGNOSIS — Z0289 Encounter for other administrative examinations: Secondary | ICD-10-CM

## 2020-06-05 ENCOUNTER — Ambulatory Visit (INDEPENDENT_AMBULATORY_CARE_PROVIDER_SITE_OTHER): Payer: 59 | Admitting: Adult Health

## 2020-06-05 ENCOUNTER — Encounter: Payer: Self-pay | Admitting: Adult Health

## 2020-06-05 ENCOUNTER — Other Ambulatory Visit: Payer: Self-pay

## 2020-06-05 DIAGNOSIS — F331 Major depressive disorder, recurrent, moderate: Secondary | ICD-10-CM

## 2020-06-05 DIAGNOSIS — F4320 Adjustment disorder, unspecified: Secondary | ICD-10-CM

## 2020-06-05 DIAGNOSIS — F411 Generalized anxiety disorder: Secondary | ICD-10-CM

## 2020-06-05 DIAGNOSIS — G4700 Insomnia, unspecified: Secondary | ICD-10-CM

## 2020-06-05 NOTE — Progress Notes (Signed)
Rhonda Bradley 1966/09/23 54 y.o.  Subjective:   Patient ID:  Rhonda Bradley is a 54 y.o. (DOB October 20, 1966) female.  Chief Complaint: No chief complaint on file.   HPI Rhonda Bradley presents to the office today for follow-up of GAD, MDD, insomnia, and adjustment disorder.  Describes mood today as "ok". Pleasant. Denies tearfulness. Mood symptoms - denies depression. Feels anxious - "quite a bit". Increased irritability - "I feel angry a lot". Stating "I haven't been feeling good". Having a fibromyalgia "flare up". Stating "every move I make a move, I hurt". Diagnosed with Fibromyalgia in 2000. Working on weight loss and trying to get A1C down - currently 9.7. Varying interest and motivation. Taking medications as prescribed.  Energy levels lower. Active, has not exercised over past three weeks - muscle flare-ups. Walking some days. Enjoys some usual interests and activities. Married. Lives with husband of 8 years. Has 3 children - 2 boys and 1 girl - 2 grandchildren. Family local.   Appetite adequate. Weight stable 188 - 4 pound loss. Sleeps wellSeroquel. Averages 8 hours. Napping during the day.   Focus and concentration difficulties. Difficulties reading - "reading paragraphs a few times before it sinks in". Completing tasks. Managing aspects of household - has someone to help with cleaning. Has applied for disability benefits. Denies SI or HI. Denies AH or VH.  Previous medication trials: Wellbutrin SR, Wellbutrin XL, Cymbalta, Gabapentin   PHQ2-9     Office Visit from 07/05/2019 in [facility]  PHQ-2 Total Score 2  PHQ-9 Total Score 9       Review of Systems:  Review of Systems  Musculoskeletal: Negative for gait problem.  Neurological: Negative for tremors.  Psychiatric/Behavioral:       Please refer to HPI    Medications: I have reviewed the patient's current medications.  Current Outpatient Medications  Medication Sig Dispense  Refill  . albuterol (PROVENTIL HFA;VENTOLIN HFA) 108 (90 Base) MCG/ACT inhaler Inhale 2 puffs into the lungs every 4 (four) hours as needed for wheezing or shortness of breath. 1 Inhaler 2  . alendronate (FOSAMAX) 70 MG tablet PLEASE SEE ATTACHED FOR DETAILED DIRECTIONS    . Azelastine-Fluticasone 137-50 MCG/ACT SUSP SPRAY 2 SPRAYS INTO EACH NOSTRIL EVERY DAY 23 g 0  . Black Pepper-Turmeric (TURMERIC COMPLEX/BLACK PEPPER PO) Take by mouth.    . budesonide-formoterol (SYMBICORT) 160-4.5 MCG/ACT inhaler INHALE TWO PUFFS TWICE DAILY TO PREVENT COUGH OR WHEEZE. RINSE MOUTH AFTER USE. 10.2 Inhaler 0  . buPROPion (WELLBUTRIN XL) 150 MG 24 hr tablet TAKE THREE TABLETS EVERY MORNING. 270 tablet 1  . Cholecalciferol (VITAMIN D3) 25 MCG (1000 UT) CAPS Take by mouth.    . COLLAGEN PO Take by mouth.    . cyclobenzaprine (FLEXERIL) 5 MG tablet Take 5 mg by mouth 3 (three) times daily as needed for muscle spasms.    [name] esomeprazole (NEXIUM) 40 MG capsule Take 40 mg by mouth 2 (two) times daily.    . furosemide (LASIX) 40 MG tablet Take 80 mg by mouth daily. Patient takes as needed per patient     . glucosamine-chondroitin 500-400 MG tablet Take 1 tablet by mouth 3 (three) times daily.    . irbesartan (AVAPRO) 75 MG tablet Take 75 mg by mouth daily.    [name] levothyroxine (SYNTHROID, LEVOTHROID) 100 MCG tablet TAKE 1 TABLET ONCE DAILY ON AN EMPTY STOMACH 30 MINUTES BEFORE BREAKFAST  2  . liothyronine (CYTOMEL) 5 MCG tablet TAKE 1 TABLET BY  MOUTH TWICE A DAY ON AN EMPTY STOMACH  5  . loratadine (CLARITIN) 10 MG tablet Take 10 mg by mouth every morning.    . magnesium gluconate (MAGONATE) 500 MG tablet Take 500 mg by mouth 2 (two) times daily.    . meloxicam (MOBIC) 15 MG tablet 1 TABLET ONCE A DAY AS NEEDED ORALLY 30  3  . metFORMIN (GLUCOPHAGE-XR) 500 MG 24 hr tablet 3 TABLETS WITH EVENING MEAL ONCE A DAY ORALLY 30 DAY(S)  11  . montelukast (SINGULAIR) 10 MG tablet Take 1 tablet (10 mg total) by mouth at bedtime. 30  tablet 5  . PREMARIN vaginal cream PLEASE SEE ATTACHED FOR DETAILED DIRECTIONS    . QUEtiapine (SEROQUEL) 25 MG tablet TAKE ONE TO THREE TABLETS AT BEDTIME FOR SLEEP. 270 tablet 1  . rosuvastatin (CRESTOR) 5 MG tablet     . simethicone (MYLICON) 462 MG chewable tablet Chew 125 mg by mouth every 6 (six) hours as needed for flatulence.    [name] tiZANidine (ZANAFLEX) 4 MG tablet     . YUVAFEM 10 MCG TABS vaginal tablet Place 1 tablet vaginally 2 (two) times a week.     No current facility-administered medications for this visit.    Medication Side Effects: None  Allergies:  Allergies  Allergen Reactions  . Erythromycin Nausea And Vomiting  . Prednisone   . Flagyl [Metronidazole] Rash  . Penicillins Rash    Past Medical History:  Diagnosis Date  . Anemia    as a child   . Anxiety   . Asthma   . Back pain   . Chronic fatigue syndrome   . Complication of anesthesia    slow to wake up age 32, but since cholecystectomy and no problems   . Constipation   . Depression   . Diabetes ([place])   . Family history of anesthesia complication    father slow to wake up   . Fatty liver   . Fibromyalgia   . GERD (gastroesophageal reflux disease)   . Gestational diabetes   . Headache   . High blood pressure   . High cholesterol   . Hypertension   . IBS (irritable bowel syndrome)   . Joint pain   . Lactose intolerance   . Osteopenia   . SOB (shortness of breath)     Family History  Problem Relation Age of Onset  . Breast cancer Maternal Aunt   . Allergic rhinitis Mother   . Asthma Mother   . High blood pressure Mother   . High Cholesterol Mother   . Depression Mother   . Anxiety disorder Mother   . Alcoholism Mother   . Eczema Father   . High blood pressure Father   . High Cholesterol Father   . Heart disease Father   . Depression Father   . Liver disease Father   . Alcoholism Father   . AAA (abdominal aortic aneurysm) Father   . Angioedema Maternal Grandfather   . Urticaria  Maternal Grandfather     Social History   Socioeconomic History  . Marital status: Married    Spouse name: Rush Landmark  . Number of children: Not on file  . Years of education: Not on file  . Highest education level: Not on file  Occupational History  . 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 Smoking status: Never Smoker  . Smokeless tobacco: Never Used  Substance and Sexual Activity  . Alcohol use: Yes  Comment: socially   . Drug use: No  . Sexual activity: Not on file  Other Topics Concern  . Not on file  Social History Narrative  . Not on file   Social Determinants of Health   Financial Resource Strain:   . Difficulty of Paying Living Expenses:   Food Insecurity:   . Worried About Charity fundraiser in the Last Year:   . Arboriculturist in the Last Year:   Transportation Needs:   . Film/video editor (Medical):   [name] Lack of Transportation (Non-Medical):   Physical Activity:   . Days of Exercise per Week:   . Minutes of Exercise per Session:   Stress:   . Feeling of Stress :   Social Connections:   . Frequency of Communication with Friends and Family:   . Frequency of Social Gatherings with Friends and Family:   . Attends Religious Services:   . Active Member of Clubs or Organizations:   . Attends Archivist Meetings:   [name] Marital Status:   Intimate Partner Violence:   . Fear of Current or Ex-Partner:   . Emotionally Abused:   [name] Physically Abused:   . Sexually Abused:     Past Medical History, Surgical history, Social history, and Family history were reviewed and updated as appropriate.   Please see review of systems for further details on the patient's review from today.   Objective:   Physical Exam:  There were no vitals taken for this visit.  Physical Exam Constitutional:      General: She is not in acute distress. Musculoskeletal:        General: No deformity.  Neurological:     Mental Status: She is  alert and oriented to person, place, and time.     Coordination: Coordination normal.  Psychiatric:        Attention and Perception: Attention and perception normal. She does not perceive auditory or visual hallucinations.        Mood and Affect: Mood is anxious. Mood is not depressed. Affect is not labile, blunt, angry or inappropriate.        Speech: Speech normal.        Behavior: Behavior normal.        Thought Content: Thought content normal. Thought content is not paranoid or delusional. Thought content does not include homicidal or suicidal ideation. Thought content does not include homicidal or suicidal plan.        Cognition and Memory: Cognition and memory normal.        Judgment: Judgment normal.     Comments: Insight intact     Lab Review:     Component Value Date/Time   NA 142 07/05/2019 1048   K 4.7 07/05/2019 1048   CL 99 07/05/2019 1048   CO2 22 07/05/2019 1048   GLUCOSE 167 (H) 07/05/2019 1048   GLUCOSE 192 (H) 09/20/2014 0445   BUN 12 07/05/2019 1048   CREATININE 0.65 07/05/2019 1048   CALCIUM 10.1 07/05/2019 1048   PROT 7.2 07/05/2019 1048   ALBUMIN 5.1 (H) 07/05/2019 1048   AST 26 07/05/2019 1048   ALT 56 (H) 07/05/2019 1048   ALKPHOS 59 07/05/2019 1048   BILITOT 0.5 07/05/2019 1048   GFRNONAA 101 07/05/2019 1048   GFRAA 116 07/05/2019 1048       Component Value Date/Time   WBC 8.1 01/30/2019 1808   WBC 9.2 09/12/2014 1515   RBC 5.14 01/30/2019 1808   RBC  4.88 09/12/2014 1515   HGB 14.7 01/30/2019 1808   HCT 43.2 01/30/2019 1808   PLT 363 01/30/2019 1808   MCV 84 01/30/2019 1808   MCH 28.6 01/30/2019 1808   MCH 29.9 09/12/2014 1515   MCHC 34.0 01/30/2019 1808   MCHC 33.7 09/12/2014 1515   RDW 13.3 01/30/2019 1808   LYMPHSABS 3.5 (H) 01/30/2019 1808   MONOABS 0.5 09/05/2010 2335   EOSABS 0.1 01/30/2019 1808   BASOSABS 0.1 01/30/2019 1808    No results found for: POCLITH, LITHIUM   No results found for: PHENYTOIN, PHENOBARB, VALPROATE, CBMZ    .res Assessment: Plan:    Plan:  PDMP reviewed  1. Wellbutrin XL 450mg  daily - may reduce to 300mg  daily 2. Seroquel 25mg  - 1 to 3 tablets at bedtime.  RTC 3 months  Patient advised to contact office with any questions, adverse effects, or acute worsening in signs and symptoms.  Discussed potential metabolic side effects associated with atypical antipsychotics, as well as potential risk for movement side effects. Advised pt to contact office if movement side effects occur.     Diagnoses and all orders for this visit:  Insomnia, unspecified type  Major depressive disorder, recurrent episode, moderate (HCC)  Generalized anxiety disorder  Adjustment disorder, unspecified type     Please see After Visit Summary for patient specific instructions.  Future Appointments  Date Time Provider [place]  09/17/2020  4:20 PM , Berdie Ogren, NP [facility] None    No orders of the defined types were placed in this encounter.   -------------------------------

## 2020-06-24 ENCOUNTER — Other Ambulatory Visit: Payer: Self-pay | Admitting: Adult Health

## 2020-06-24 DIAGNOSIS — G4700 Insomnia, unspecified: Secondary | ICD-10-CM

## 2020-07-08 ENCOUNTER — Telehealth (INDEPENDENT_AMBULATORY_CARE_PROVIDER_SITE_OTHER): Payer: Self-pay | Admitting: Psychology

## 2020-07-08 NOTE — Telephone Encounter (Signed)
Error

## 2020-07-08 NOTE — Telephone Encounter (Signed)
  Office: [phone]  /  Fax: [phone]  Date of Call: July 08, 2020  Time of Call: 8:06am Duration of Call: 6 minutes Provider: Glennie Isle, PsyD  CONTENT: This provider called Mayme as she requested records from date of service July 12, 2019. She explained, "I filed for disability and they are requesting all records." This provider and Chika discussed the details of the intake note that would be released and Mahitha provided verbal consent for this provider to send the note. This provider also discussed details of the release of information documentation that would need to be completed prior to this provider releasing records. She provided verbal consent for this provide to e-mail her the release of information, which she indicated she would fax back to the clinic. Annaclaire denied any other concerns at this time. A brief risk assessment was completed. Lasaundra denied experiencing suicidal and homicidal ideation, plan, or intent.  PLAN: This provider will e-mail Shantana the release of information documentation and release records once the completed paperwork is received.

## 2020-07-14 ENCOUNTER — Telehealth (INDEPENDENT_AMBULATORY_CARE_PROVIDER_SITE_OTHER): Payer: Self-pay | Admitting: Psychology

## 2020-07-14 NOTE — Telephone Encounter (Signed)
Pt called regarding her disability med records, forms have been faxed per Dr.Barker, and informed patient of them being faxed.

## 2020-08-21 ENCOUNTER — Telehealth (INDEPENDENT_AMBULATORY_CARE_PROVIDER_SITE_OTHER): Payer: Self-pay | Admitting: Psychology

## 2020-08-21 NOTE — Telephone Encounter (Addendum)
  Office: [phone]  /  Fax: [phone]  Date of Call: August 21, 2020  Time of Call: 3:09pm Duration of Call: ~4 minutes Provider: Glennie Isle, PsyD  CONTENT: This provider called Jiana as she requested records from date of service July 12, 2019. She explained she did not have the opportunity to see what was released to the disability's office and she wanted a copy for her personal records. This provider discussed details of the release of information documentation that would need to be completed prior to this provider releasing records. Myya expressed desire to come to the clinic on Monday (August 25, 2020) to complete the release of information document and pick up the records. She acknowledged understanding that this provider nor the clinic would be able to ensure confidentiality once she leaves the clinic with her records; therefore, she was encouraged to place her records in a private/confidential area. Layann denied any other concerns at this time. A brief risk assessment was completed. Marcelina denied experiencing suicidal and homicidal ideation, plan, or intent.  PLAN: Quina indicated she will come to the clinic on Monday (August 25, 2020) to complete the release of information document and pick up requested records.   ADDENDUM: This provider was informed by clinic staff Tildon Husky) at approximately 12:55pm on August 25, 2020 that Melissia presented to the clinic. She completed the necessary release of information document and picked up the requested records.

## 2020-09-16 ENCOUNTER — Other Ambulatory Visit: Payer: Self-pay

## 2020-09-16 ENCOUNTER — Ambulatory Visit (INDEPENDENT_AMBULATORY_CARE_PROVIDER_SITE_OTHER): Payer: 59 | Admitting: Adult Health

## 2020-09-16 ENCOUNTER — Encounter: Payer: Self-pay | Admitting: Adult Health

## 2020-09-16 DIAGNOSIS — M797 Fibromyalgia: Secondary | ICD-10-CM | POA: Diagnosis not present

## 2020-09-16 DIAGNOSIS — G4700 Insomnia, unspecified: Secondary | ICD-10-CM

## 2020-09-16 DIAGNOSIS — F331 Major depressive disorder, recurrent, moderate: Secondary | ICD-10-CM

## 2020-09-16 DIAGNOSIS — F4320 Adjustment disorder, unspecified: Secondary | ICD-10-CM

## 2020-09-16 DIAGNOSIS — F411 Generalized anxiety disorder: Secondary | ICD-10-CM

## 2020-09-16 MED ORDER — ESZOPICLONE 2 MG PO TABS: 2.0000 mg | ORAL_TABLET | Freq: Every evening | ORAL | 2 refills | Status: DC | PRN

## 2020-09-16 MED ORDER — ESCITALOPRAM OXALATE 20 MG PO TABS: 20.0000 mg | ORAL_TABLET | Freq: Every day | ORAL | 2 refills | Status: DC

## 2020-09-16 NOTE — Progress Notes (Signed)
[name] [id number] 23-Jun-1966 54 y.o.  Subjective:   Patient ID:  Rhonda Bradley is a 54 y.o. (DOB 1966/07/13) female.  Chief Complaint: No chief complaint on file.   HPI [name] presents to the office today for follow-up of GAD, MDD, insomnia, and adjustment disorder.  Describes mood today as "ok". Pleasant. Denies tearfulness. Mood symptoms - decreased depression, anxiety, and irritability. Stating "my mood is better". PCP stopped Wellbutrin and added Lexapro. Stating "it was like a light switch was turned on". Doesn't feel as "snappy". Stating "my mood is better, I may want to increase the Lexapro". Has fibromyalgia "flare ups". Diagnosed with Fibromyalgia in 2000. Varying interest and motivation. Taking medications as prescribed.  Energy levels "not great". Active, exercises when she is able. Walking on the treadmill. Enjoys some usual interests and activities. Married. Lives with husband of 8 years. Has 3 children - 2 boys and 1 girl - 2 grandchildren. Family local.   Appetite adequate. Weight gain 193 pounds. Sleeps well with Seroquel. Averages 8 hours. Napping during the day.   Focus and concentration difficulties. Struggles to think of words - "simple words". Completing tasks. Managing aspects of household. Unable to work. Has applied for disability benefits. Denies SI or HI. Denies AH or VH.  Previous medication trials: Wellbutrin SR, Wellbutrin XL, Cymbalta, Gabapentin    PHQ2-9     Office Visit from 07/05/2019 in [place]  PHQ-2 Total Score 2  PHQ-9 Total Score 9       Review of Systems:  Review of Systems  Musculoskeletal: Negative for gait problem.  Neurological: Negative for tremors.  Psychiatric/Behavioral:       Please refer to HPI    Medications: I have reviewed the patient's current medications.  Current Outpatient Medications  Medication Sig Dispense Refill  . albuterol (PROVENTIL HFA;VENTOLIN HFA) 108 (90  Base) MCG/ACT inhaler Inhale 2 puffs into the lungs every 4 (four) hours as needed for wheezing or shortness of breath. 1 Inhaler 2  . alendronate (FOSAMAX) 70 MG tablet PLEASE SEE ATTACHED FOR DETAILED DIRECTIONS    . Azelastine-Fluticasone 137-50 MCG/ACT SUSP SPRAY 2 SPRAYS INTO EACH NOSTRIL EVERY DAY 23 g 0  . Black Pepper-Turmeric (TURMERIC COMPLEX/BLACK PEPPER PO) Take by mouth.    . budesonide-formoterol (SYMBICORT) 160-4.5 MCG/ACT inhaler INHALE TWO PUFFS TWICE DAILY TO PREVENT COUGH OR WHEEZE. RINSE MOUTH AFTER USE. 10.2 Inhaler 0  . Cholecalciferol (VITAMIN D3) 25 MCG (1000 UT) CAPS Take by mouth.    . COLLAGEN PO Take by mouth.    . cyclobenzaprine (FLEXERIL) 5 MG tablet Take 5 mg by mouth 3 (three) times daily as needed for muscle spasms.    [name] escitalopram (LEXAPRO) 10 MG tablet Take 10 mg by mouth daily.    [name] escitalopram (LEXAPRO) 20 MG tablet Take 1 tablet (20 mg total) by mouth daily. 30 tablet 2  . esomeprazole (NEXIUM) 40 MG capsule Take 40 mg by mouth 2 (two) times daily.    . eszopiclone (LUNESTA) 2 MG TABS tablet Take 1 tablet (2 mg total) by mouth at bedtime as needed for sleep. Take immediately before bedtime 30 tablet 2  . furosemide (LASIX) 40 MG tablet Take 80 mg by mouth daily. Patient takes as needed per patient     . glucosamine-chondroitin 500-400 MG tablet Take 1 tablet by mouth 3 (three) times daily.    . irbesartan (AVAPRO) 75 MG tablet Take 75 mg by mouth daily.    [name] levothyroxine (  SYNTHROID, LEVOTHROID) 100 MCG tablet TAKE 1 TABLET ONCE DAILY ON AN EMPTY STOMACH 30 MINUTES BEFORE BREAKFAST  2  . liothyronine (CYTOMEL) 5 MCG tablet TAKE 1 TABLET BY MOUTH TWICE A DAY ON AN EMPTY STOMACH  5  . loratadine (CLARITIN) 10 MG tablet Take 10 mg by mouth every morning.    . magnesium gluconate (MAGONATE) 500 MG tablet Take 500 mg by mouth 2 (two) times daily.    . meloxicam (MOBIC) 15 MG tablet 1 TABLET ONCE A DAY AS NEEDED ORALLY 30  3  . metFORMIN (GLUCOPHAGE-XR) 500 MG  24 hr tablet 3 TABLETS WITH EVENING MEAL ONCE A DAY ORALLY 30 DAY(S)  11  . montelukast (SINGULAIR) 10 MG tablet Take 1 tablet (10 mg total) by mouth at bedtime. 30 tablet 5  . PREMARIN vaginal cream PLEASE SEE ATTACHED FOR DETAILED DIRECTIONS    . rosuvastatin (CRESTOR) 5 MG tablet     . simethicone (MYLICON) 850 MG chewable tablet Chew 125 mg by mouth every 6 (six) hours as needed for flatulence.    [name] tiZANidine (ZANAFLEX) 4 MG tablet     . YUVAFEM 10 MCG TABS vaginal tablet Place 1 tablet vaginally 2 (two) times a week.     No current facility-administered medications for this visit.    Medication Side Effects: None  Allergies:  Allergies  Allergen Reactions  . Erythromycin Nausea And Vomiting  . Prednisone   . Flagyl [Metronidazole] Rash  . Penicillins Rash    Past Medical History:  Diagnosis Date  . Anemia    as a child   . Anxiety   . Asthma   . Back pain   . Chronic fatigue syndrome   . Complication of anesthesia    slow to wake up age 73, but since cholecystectomy and no problems   . Constipation   . Depression   . Diabetes ([place])   . Family history of anesthesia complication    father slow to wake up   . Fatty liver   . Fibromyalgia   . GERD (gastroesophageal reflux disease)   . Gestational diabetes   . Headache   . High blood pressure   . High cholesterol   . Hypertension   . IBS (irritable bowel syndrome)   . Joint pain   . Lactose intolerance   . Osteopenia   . SOB (shortness of breath)     Family History  Problem Relation Age of Onset  . Breast cancer Maternal Aunt   . Allergic rhinitis Mother   . Asthma Mother   . High blood pressure Mother   . High Cholesterol Mother   . Depression Mother   . Anxiety disorder Mother   . Alcoholism Mother   . Eczema Father   . High blood pressure Father   . High Cholesterol Father   . Heart disease Father   . Depression Father   . Liver disease Father   . Alcoholism Father   . AAA (abdominal aortic  aneurysm) Father   . Angioedema Maternal Grandfather   . Urticaria Maternal Grandfather     Social History   Socioeconomic History  . Marital status: Married    Spouse name: Rush Landmark  . Number of children: Not on file  . Years of education: Not on file  . Highest education level: Not on file  Occupational History  . 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 Smoking status: Never Smoker  . Smokeless tobacco: Never Used  Substance and Sexual Activity  . Alcohol use: Yes    Comment: socially   . Drug use: No  . Sexual activity: Not on file  Other Topics Concern  . Not on file  Social History Narrative  . Not on file   Social Determinants of Health   Financial Resource Strain:   . Difficulty of Paying Living Expenses: Not on file  Food Insecurity:   . Worried About Charity fundraiser in the Last Year: Not on file  . Ran Out of Food in the Last Year: Not on file  Transportation Needs:   . Lack of Transportation (Medical): Not on file  . Lack of Transportation (Non-Medical): Not on file  Physical Activity:   . Days of Exercise per Week: Not on file  . Minutes of Exercise per Session: Not on file  Stress:   . Feeling of Stress : Not on file  Social Connections:   . Frequency of Communication with Friends and Family: Not on file  . Frequency of Social Gatherings with Friends and Family: Not on file  . Attends Religious Services: Not on file  . Active Member of Clubs or Organizations: Not on file  . Attends Archivist Meetings: Not on file  . Marital Status: Not on file  Intimate Partner Violence:   . Fear of Current or Ex-Partner: Not on file  . Emotionally Abused: Not on file  . Physically Abused: Not on file  . Sexually Abused: Not on file    Past Medical History, Surgical history, Social history, and Family history were reviewed and updated as appropriate.   Please see review of systems for further details on the patient's  review from today.   Objective:   Physical Exam:  There were no vitals taken for this visit.  Physical Exam Constitutional:      General: She is not in acute distress. Musculoskeletal:        General: No deformity.  Neurological:     Mental Status: She is alert and oriented to person, place, and time.     Coordination: Coordination normal.  Psychiatric:        Attention and Perception: Attention and perception normal. She does not perceive auditory or visual hallucinations.        Mood and Affect: Mood normal. Mood is not anxious or depressed. Affect is not labile, blunt, angry or inappropriate.        Speech: Speech normal.        Behavior: Behavior normal.        Thought Content: Thought content normal. Thought content is not paranoid or delusional. Thought content does not include homicidal or suicidal ideation. Thought content does not include homicidal or suicidal plan.        Cognition and Memory: Cognition and memory normal.        Judgment: Judgment normal.     Comments: Insight intact     Lab Review:     Component Value Date/Time   NA 142 07/05/2019 1048   K 4.7 07/05/2019 1048   CL 99 07/05/2019 1048   CO2 22 07/05/2019 1048   GLUCOSE 167 (H) 07/05/2019 1048   GLUCOSE 192 (H) 09/20/2014 0445   BUN 12 07/05/2019 1048   CREATININE 0.65 07/05/2019 1048   CALCIUM 10.1 07/05/2019 1048   PROT 7.2 07/05/2019 1048   ALBUMIN 5.1 (H) 07/05/2019 1048   AST 26 07/05/2019 1048   ALT 56 (H) 07/05/2019 1048   ALKPHOS 59 07/05/2019  1048   BILITOT 0.5 07/05/2019 1048   GFRNONAA 101 07/05/2019 1048   GFRAA 116 07/05/2019 1048       Component Value Date/Time   WBC 8.1 01/30/2019 1808   WBC 9.2 09/12/2014 1515   RBC 5.14 01/30/2019 1808   RBC 4.88 09/12/2014 1515   HGB 14.7 01/30/2019 1808   HCT 43.2 01/30/2019 1808   PLT 363 01/30/2019 1808   MCV 84 01/30/2019 1808   MCH 28.6 01/30/2019 1808   MCH 29.9 09/12/2014 1515   MCHC 34.0 01/30/2019 1808   MCHC 33.7  09/12/2014 1515   RDW 13.3 01/30/2019 1808   LYMPHSABS 3.5 (H) 01/30/2019 1808   MONOABS 0.5 09/05/2010 2335   EOSABS 0.1 01/30/2019 1808   BASOSABS 0.1 01/30/2019 1808    No results found for: POCLITH, LITHIUM   No results found for: PHENYTOIN, PHENOBARB, VALPROATE, CBMZ   .res Assessment: Plan:    Plan:  PDMP reviewed  1. Stopped by PCP - Wellbutrin XL 450mg  daily - may reduce to 300mg  daily 2. Seroquel 25mg  - 1 to 3 tablets at bedtime.  3. Increase Lexapro 10mg  daily to 20mg  daily 4. Add Lunesta 2mg  at bedtime for sleep  Consider Ritalin for "fibro fog".   RTC 3 months  Patient advised to contact office with any questions, adverse effects, or acute worsening in signs and symptoms.  Discussed potential metabolic side effects associated with atypical antipsychotics, as well as potential risk for movement side effects. Advised pt to contact office if movement side effects occur.    Diagnoses and all orders for this visit:  Insomnia, unspecified type -     eszopiclone (LUNESTA) 2 MG TABS tablet; Take 1 tablet (2 mg total) by mouth at bedtime as needed for sleep. Take immediately before bedtime  Major depressive disorder, recurrent episode, moderate (HCC) -     escitalopram (LEXAPRO) 20 MG tablet; Take 1 tablet (20 mg total) by mouth daily.  Generalized anxiety disorder -     escitalopram (LEXAPRO) 20 MG tablet; Take 1 tablet (20 mg total) by mouth daily.  Fibromyalgia  Adjustment disorder, unspecified type     Please see After Visit Summary for patient specific instructions.  No future appointments.  No orders of the defined types were placed in this encounter.   -------------------------------

## 2020-09-17 ENCOUNTER — Ambulatory Visit: Payer: 59 | Admitting: Adult Health

## 2020-10-10 ENCOUNTER — Other Ambulatory Visit: Payer: Self-pay | Admitting: Adult Health

## 2020-10-10 DIAGNOSIS — F331 Major depressive disorder, recurrent, moderate: Secondary | ICD-10-CM

## 2020-10-10 DIAGNOSIS — F411 Generalized anxiety disorder: Secondary | ICD-10-CM

## 2020-10-23 ENCOUNTER — Encounter: Payer: Self-pay | Admitting: Adult Health

## 2020-10-23 ENCOUNTER — Other Ambulatory Visit: Payer: Self-pay

## 2020-10-23 ENCOUNTER — Ambulatory Visit (INDEPENDENT_AMBULATORY_CARE_PROVIDER_SITE_OTHER): Payer: 59 | Admitting: Adult Health

## 2020-10-23 DIAGNOSIS — R41840 Attention and concentration deficit: Secondary | ICD-10-CM | POA: Diagnosis not present

## 2020-10-23 DIAGNOSIS — F411 Generalized anxiety disorder: Secondary | ICD-10-CM | POA: Diagnosis not present

## 2020-10-23 DIAGNOSIS — F331 Major depressive disorder, recurrent, moderate: Secondary | ICD-10-CM | POA: Diagnosis not present

## 2020-10-23 DIAGNOSIS — G4700 Insomnia, unspecified: Secondary | ICD-10-CM | POA: Diagnosis not present

## 2020-10-23 DIAGNOSIS — M797 Fibromyalgia: Secondary | ICD-10-CM | POA: Diagnosis not present

## 2020-10-23 MED ORDER — METHYLPHENIDATE HCL ER (OSM) 18 MG PO TBCR: 18.0000 mg | EXTENDED_RELEASE_TABLET | Freq: Every day | ORAL | 0 refills | Status: DC

## 2020-10-23 NOTE — Progress Notes (Signed)
Rhonda Bradley [id number] 18-May-1966 54 y.o.  Subjective:   Patient ID:  Rhonda Bradley is a 54 y.o. (DOB 05-17-1966) female.  Chief Complaint: No chief complaint on file.   HPI [name] presents to the office today for follow-up of GAD, MDD, insomnia, and adjustment disorder.  Describes mood today as "ok". Pleasant. Tearful at times. Mood symptoms - decreased depression, anxiety, and irritability. Stating "my mood is better". Feels like Lexapro at 20mg  is working well for her. Chronic pain and fatigue from Fibromyalgia. Diagnosed with Fibromyalgia in 2000. Would like to try medication for cognitive deficit. Varying interest and motivation. Taking medications as prescribed.  Energy levels "lowers". Active, exercises when she is able.   Enjoys some usual interests and activities. Married. Lives with husband of 8 years. Has 3 children - 2 boys and 1 girl - 2 grandchildren. Family local.   Appetite adequate. Weight gain 193 to 195 pounds. Sleeps well with Lunesta. Averages 8 hours. Napping during the day.   Focus and concentration difficulties. Can't retrieve simple words. Fibromyalgia - cognitive issues. MRI negative. Completing tasks. Managing aspects of household. Unable to work. Has applied for disability benefits. Denies SI or HI. Denies AH or VH.  Previous medication trials: Wellbutrin SR, Wellbutrin XL, Cymbalta, Gabapentin     PHQ2-9     Office Visit from 07/05/2019 in [place]  PHQ-2 Total Score 2  PHQ-9 Total Score 9       Review of Systems:  Review of Systems  Musculoskeletal: Negative for gait problem.  Neurological: Negative for tremors.  Psychiatric/Behavioral:       Please refer to HPI    Medications: I have reviewed the patient's current medications.  Current Outpatient Medications  Medication Sig Dispense Refill  . albuterol (PROVENTIL HFA;VENTOLIN HFA) 108 (90 Base) MCG/ACT inhaler Inhale 2 puffs into the lungs every  4 (four) hours as needed for wheezing or shortness of breath. 1 Inhaler 2  . alendronate (FOSAMAX) 70 MG tablet PLEASE SEE ATTACHED FOR DETAILED DIRECTIONS    . Azelastine-Fluticasone 137-50 MCG/ACT SUSP SPRAY 2 SPRAYS INTO EACH NOSTRIL EVERY DAY 23 g 0  . Black Pepper-Turmeric (TURMERIC COMPLEX/BLACK PEPPER PO) Take by mouth.    . budesonide-formoterol (SYMBICORT) 160-4.5 MCG/ACT inhaler INHALE TWO PUFFS TWICE DAILY TO PREVENT COUGH OR WHEEZE. RINSE MOUTH AFTER USE. 10.2 Inhaler 0  . Cholecalciferol (VITAMIN D3) 25 MCG (1000 UT) CAPS Take by mouth.    . COLLAGEN PO Take by mouth.    . cyclobenzaprine (FLEXERIL) 5 MG tablet Take 5 mg by mouth 3 (three) times daily as needed for muscle spasms.    [name] escitalopram (LEXAPRO) 10 MG tablet Take 10 mg by mouth daily.    [name] escitalopram (LEXAPRO) 20 MG tablet TAKE 1 TABLET BY MOUTH EVERY DAY 90 tablet 1  . esomeprazole (NEXIUM) 40 MG capsule Take 40 mg by mouth 2 (two) times daily.    . eszopiclone (LUNESTA) 2 MG TABS tablet Take 1 tablet (2 mg total) by mouth at bedtime as needed for sleep. Take immediately before bedtime 30 tablet 2  . furosemide (LASIX) 40 MG tablet Take 80 mg by mouth daily. Patient takes as needed per patient     . glucosamine-chondroitin 500-400 MG tablet Take 1 tablet by mouth 3 (three) times daily.    . irbesartan (AVAPRO) 75 MG tablet Take 75 mg by mouth daily.    [name] levothyroxine (SYNTHROID, LEVOTHROID) 100 MCG tablet TAKE 1 TABLET ONCE DAILY ON  AN EMPTY STOMACH 30 MINUTES BEFORE BREAKFAST  2  . liothyronine (CYTOMEL) 5 MCG tablet TAKE 1 TABLET BY MOUTH TWICE A DAY ON AN EMPTY STOMACH  5  . loratadine (CLARITIN) 10 MG tablet Take 10 mg by mouth every morning.    . magnesium gluconate (MAGONATE) 500 MG tablet Take 500 mg by mouth 2 (two) times daily.    . meloxicam (MOBIC) 15 MG tablet 1 TABLET ONCE A DAY AS NEEDED ORALLY 30  3  . metFORMIN (GLUCOPHAGE-XR) 500 MG 24 hr tablet 3 TABLETS WITH EVENING MEAL ONCE A DAY ORALLY 30 DAY(S)   11  . methylphenidate (CONCERTA) 18 MG PO CR tablet Take 1 tablet (18 mg total) by mouth daily. 30 tablet 0  . montelukast (SINGULAIR) 10 MG tablet Take 1 tablet (10 mg total) by mouth at bedtime. 30 tablet 5  . PREMARIN vaginal cream PLEASE SEE ATTACHED FOR DETAILED DIRECTIONS    . rosuvastatin (CRESTOR) 5 MG tablet     . simethicone (MYLICON) 161 MG chewable tablet Chew 125 mg by mouth every 6 (six) hours as needed for flatulence.    [name] tiZANidine (ZANAFLEX) 4 MG tablet     . YUVAFEM 10 MCG TABS vaginal tablet Place 1 tablet vaginally 2 (two) times a week.     No current facility-administered medications for this visit.    Medication Side Effects: None  Allergies:  Allergies  Allergen Reactions  . Erythromycin Nausea And Vomiting  . Prednisone   . Flagyl [Metronidazole] Rash  . Penicillins Rash    Past Medical History:  Diagnosis Date  . Anemia    as a child   . Anxiety   . Asthma   . Back pain   . Chronic fatigue syndrome   . Complication of anesthesia    slow to wake up age 34, but since cholecystectomy and no problems   . Constipation   . Depression   . Diabetes ([place])   . Family history of anesthesia complication    father slow to wake up   . Fatty liver   . Fibromyalgia   . GERD (gastroesophageal reflux disease)   . Gestational diabetes   . Headache   . High blood pressure   . High cholesterol   . Hypertension   . IBS (irritable bowel syndrome)   . Joint pain   . Lactose intolerance   . Osteopenia   . SOB (shortness of breath)     Family History  Problem Relation Age of Onset  . Breast cancer Maternal Aunt   . Allergic rhinitis Mother   . Asthma Mother   . High blood pressure Mother   . High Cholesterol Mother   . Depression Mother   . Anxiety disorder Mother   . Alcoholism Mother   . Eczema Father   . High blood pressure Father   . High Cholesterol Father   . Heart disease Father   . Depression Father   . Liver disease Father   . Alcoholism  Father   . AAA (abdominal aortic aneurysm) Father   . Angioedema Maternal Grandfather   . Urticaria Maternal Grandfather     Social History   Socioeconomic History  . Marital status: Married    Spouse name: Rush Landmark  . Number of children: Not on file  . Years of education: Not on file  . Highest education level: Not on file  Occupational History  . 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  Smoking status: Never Smoker  . Smokeless tobacco: Never Used  Substance and Sexual Activity  . Alcohol use: Yes    Comment: socially   . Drug use: No  . Sexual activity: Not on file  Other Topics Concern  . Not on file  Social History Narrative  . Not on file   Social Determinants of Health   Financial Resource Strain:   . Difficulty of Paying Living Expenses: Not on file  Food Insecurity:   . Worried About Charity fundraiser in the Last Year: Not on file  . Ran Out of Food in the Last Year: Not on file  Transportation Needs:   . Lack of Transportation (Medical): Not on file  . Lack of Transportation (Non-Medical): Not on file  Physical Activity:   . Days of Exercise per Week: Not on file  . Minutes of Exercise per Session: Not on file  Stress:   . Feeling of Stress : Not on file  Social Connections:   . Frequency of Communication with Friends and Family: Not on file  . Frequency of Social Gatherings with Friends and Family: Not on file  . Attends Religious Services: Not on file  . Active Member of Clubs or Organizations: Not on file  . Attends Archivist Meetings: Not on file  . Marital Status: Not on file  Intimate Partner Violence:   . Fear of Current or Ex-Partner: Not on file  . Emotionally Abused: Not on file  . Physically Abused: Not on file  . Sexually Abused: Not on file    Past Medical History, Surgical history, Social history, and Family history were reviewed and updated as appropriate.   Please see review of systems for  further details on the patient's review from today.   Objective:   Physical Exam:  There were no vitals taken for this visit.  Physical Exam Constitutional:      General: She is not in acute distress. Musculoskeletal:        General: No deformity.  Neurological:     Mental Status: She is alert and oriented to person, place, and time.     Coordination: Coordination normal.  Psychiatric:        Attention and Perception: Attention and perception normal. She does not perceive auditory or visual hallucinations.        Mood and Affect: Mood normal. Mood is not anxious or depressed. Affect is not labile, blunt, angry or inappropriate.        Speech: Speech normal.        Behavior: Behavior normal.        Thought Content: Thought content normal. Thought content is not paranoid or delusional. Thought content does not include homicidal or suicidal ideation. Thought content does not include homicidal or suicidal plan.        Cognition and Memory: Cognition and memory normal.        Judgment: Judgment normal.     Comments: Insight intact     Lab Review:     Component Value Date/Time   NA 142 07/05/2019 1048   K 4.7 07/05/2019 1048   CL 99 07/05/2019 1048   CO2 22 07/05/2019 1048   GLUCOSE 167 (H) 07/05/2019 1048   GLUCOSE 192 (H) 09/20/2014 0445   BUN 12 07/05/2019 1048   CREATININE 0.65 07/05/2019 1048   CALCIUM 10.1 07/05/2019 1048   PROT 7.2 07/05/2019 1048   ALBUMIN 5.1 (H) 07/05/2019 1048   AST 26 07/05/2019 1048  ALT 56 (H) 07/05/2019 1048   ALKPHOS 59 07/05/2019 1048   BILITOT 0.5 07/05/2019 1048   GFRNONAA 101 07/05/2019 1048   GFRAA 116 07/05/2019 1048       Component Value Date/Time   WBC 8.1 01/30/2019 1808   WBC 9.2 09/12/2014 1515   RBC 5.14 01/30/2019 1808   RBC 4.88 09/12/2014 1515   HGB 14.7 01/30/2019 1808   HCT 43.2 01/30/2019 1808   PLT 363 01/30/2019 1808   MCV 84 01/30/2019 1808   MCH 28.6 01/30/2019 1808   MCH 29.9 09/12/2014 1515   MCHC 34.0  01/30/2019 1808   MCHC 33.7 09/12/2014 1515   RDW 13.3 01/30/2019 1808   LYMPHSABS 3.5 (H) 01/30/2019 1808   MONOABS 0.5 09/05/2010 2335   EOSABS 0.1 01/30/2019 1808   BASOSABS 0.1 01/30/2019 1808    No results found for: POCLITH, LITHIUM   No results found for: PHENYTOIN, PHENOBARB, VALPROATE, CBMZ   .res Assessment: Plan:    Plan:  PDMP reviewed  1. Lunesta 2mg  at bedtime for sleep 2. Lexapro 20mg  daily 3. Add Concerta 18mg  daily  116/73 - 86  RTC 4 weeks   Patient advised to contact office with any questions, adverse effects, or acute worsening in signs and symptoms.  Discussed potential metabolic side effects associated with atypical antipsychotics, as well as potential risk for movement side effects. Advised pt to contact office if movement side effects occur.    Diagnoses and all orders for this visit:  Insomnia, unspecified type  Major depressive disorder, recurrent episode, moderate (HCC)  Generalized anxiety disorder  Fibromyalgia  Cognitive attention deficit -     methylphenidate (CONCERTA) 18 MG PO CR tablet; Take 1 tablet (18 mg total) by mouth daily.     Please see After Visit Summary for patient specific instructions.  Future Appointments  Date Time Provider [place]  11/26/2020  7:30 AM Kathrynn Ducking, MD [facility] None    No orders of the defined types were placed in this encounter.   -------------------------------

## 2020-10-25 ENCOUNTER — Telehealth: Payer: Self-pay

## 2020-10-25 NOTE — Telephone Encounter (Signed)
Prior authorization submitted and approved for CONCERTA 18 MG with [employer] effective 10/25/20/10/25/2023  ID# [id number]

## 2020-11-10 ENCOUNTER — Other Ambulatory Visit: Payer: Self-pay | Admitting: Geriatric Medicine

## 2020-11-10 DIAGNOSIS — Z1231 Encounter for screening mammogram for malignant neoplasm of breast: Secondary | ICD-10-CM

## 2020-11-20 ENCOUNTER — Encounter: Payer: Self-pay | Admitting: Adult Health

## 2020-11-20 ENCOUNTER — Other Ambulatory Visit: Payer: Self-pay

## 2020-11-20 ENCOUNTER — Ambulatory Visit (INDEPENDENT_AMBULATORY_CARE_PROVIDER_SITE_OTHER): Payer: 59 | Admitting: Adult Health

## 2020-11-20 DIAGNOSIS — R41840 Attention and concentration deficit: Secondary | ICD-10-CM | POA: Diagnosis not present

## 2020-11-20 DIAGNOSIS — F331 Major depressive disorder, recurrent, moderate: Secondary | ICD-10-CM

## 2020-11-20 DIAGNOSIS — G4700 Insomnia, unspecified: Secondary | ICD-10-CM

## 2020-11-20 DIAGNOSIS — M797 Fibromyalgia: Secondary | ICD-10-CM

## 2020-11-20 DIAGNOSIS — F411 Generalized anxiety disorder: Secondary | ICD-10-CM | POA: Diagnosis not present

## 2020-11-20 MED ORDER — ESZOPICLONE 2 MG PO TABS: 2.0000 mg | ORAL_TABLET | Freq: Every evening | ORAL | 2 refills | Status: DC | PRN

## 2020-11-20 MED ORDER — METHYLPHENIDATE HCL ER (OSM) 27 MG PO TBCR: 27.0000 mg | EXTENDED_RELEASE_TABLET | Freq: Every day | ORAL | 0 refills | Status: DC

## 2020-11-20 NOTE — Progress Notes (Signed)
Rhonda Bradley [id number] 1966-07-21 54 y.o.  Subjective:   Patient ID:  Rhonda Bradley is a 54 y.o. (DOB Sep 27, 1966) female.  Chief Complaint: No chief complaint on file.   HPI Rhonda Bradley presents to the office today for follow-up of GAD, MDD, insomnia, and adjustment disorder.  Describes mood today as "ok". Pleasant. Tearful at times. Mood symptoms - decreased depression, anxiety, and irritability. Stating "my mood is alright". Feels like "some days are better than others". Has a few "good hours in the evening. Feels like Lexapro at 20mg  and Johnnye Sima is working well for her. Would like to increase the Concerta.  Chronic pain and fatigue from Fibromyalgia. Diagnosed with Fibromyalgia in 2000.Varying interest and motivation. Taking medications as prescribed.  Energy levels "lower". Active, exercises when she is able.   Enjoys some usual interests and activities. Married. Lives with husband of 8 years. Has 3 children - 2 boys and 1 girl - 2 grandchildren. Family local.   Appetite adequate. Weight stable - 198 pounds. Sleeps well with Lunesta. Averages 8 hours.   Focus and concentration difficulties. Forgetful. Has word finding issues. Has not seem an improvement with the Concerta at 18mg , but is willing to try and increase the dose. Fibromyalgia - cognitive issues. MRI negative. Completing tasks. Managing aspects of household. Unable to work. Has applied for disability benefits. Denies SI or HI.  Denies AH or VH.  Previous medication trials: Wellbutrin SR, Wellbutrin XL, Cymbalta, Gabapentin    PHQ2-9   Flowsheet Row Office Visit from 07/05/2019 in [facility]  PHQ-2 Total Score 2  PHQ-9 Total Score 9       Review of Systems:  Review of Systems  Musculoskeletal: Negative for gait problem.  Neurological: Negative for tremors.  Psychiatric/Behavioral:       Please refer to HPI    Medications: I have reviewed the patient's current  medications.  Current Outpatient Medications  Medication Sig Dispense Refill  . albuterol (PROVENTIL HFA;VENTOLIN HFA) 108 (90 Base) MCG/ACT inhaler Inhale 2 puffs into the lungs every 4 (four) hours as needed for wheezing or shortness of breath. 1 Inhaler 2  . alendronate (FOSAMAX) 70 MG tablet PLEASE SEE ATTACHED FOR DETAILED DIRECTIONS    . Azelastine-Fluticasone 137-50 MCG/ACT SUSP SPRAY 2 SPRAYS INTO EACH NOSTRIL EVERY DAY 23 g 0  . Black Pepper-Turmeric (TURMERIC COMPLEX/BLACK PEPPER PO) Take by mouth.    . budesonide-formoterol (SYMBICORT) 160-4.5 MCG/ACT inhaler INHALE TWO PUFFS TWICE DAILY TO PREVENT COUGH OR WHEEZE. RINSE MOUTH AFTER USE. 10.2 Inhaler 0  . Cholecalciferol (VITAMIN D3) 25 MCG (1000 UT) CAPS Take by mouth.    . COLLAGEN PO Take by mouth.    . cyclobenzaprine (FLEXERIL) 5 MG tablet Take 5 mg by mouth 3 (three) times daily as needed for muscle spasms.    [name] escitalopram (LEXAPRO) 10 MG tablet Take 10 mg by mouth daily.    [name] escitalopram (LEXAPRO) 20 MG tablet TAKE 1 TABLET BY MOUTH EVERY DAY 90 tablet 1  . esomeprazole (NEXIUM) 40 MG capsule Take 40 mg by mouth 2 (two) times daily.    . eszopiclone (LUNESTA) 2 MG TABS tablet Take 1 tablet (2 mg total) by mouth at bedtime as needed for sleep. Take immediately before bedtime 30 tablet 2  . furosemide (LASIX) 40 MG tablet Take 80 mg by mouth daily. Patient takes as needed per patient     . glucosamine-chondroitin 500-400 MG tablet Take 1 tablet by mouth 3 (three) times daily.    [name]  irbesartan (AVAPRO) 75 MG tablet Take 75 mg by mouth daily.    [name] levothyroxine (SYNTHROID, LEVOTHROID) 100 MCG tablet TAKE 1 TABLET ONCE DAILY ON AN EMPTY STOMACH 30 MINUTES BEFORE BREAKFAST  2  . liothyronine (CYTOMEL) 5 MCG tablet TAKE 1 TABLET BY MOUTH TWICE A DAY ON AN EMPTY STOMACH  5  . loratadine (CLARITIN) 10 MG tablet Take 10 mg by mouth every morning.    . magnesium gluconate (MAGONATE) 500 MG tablet Take 500 mg by mouth 2 (two) times  daily.    . meloxicam (MOBIC) 15 MG tablet 1 TABLET ONCE A DAY AS NEEDED ORALLY 30  3  . metFORMIN (GLUCOPHAGE-XR) 500 MG 24 hr tablet 3 TABLETS WITH EVENING MEAL ONCE A DAY ORALLY 30 DAY(S)  11  . methylphenidate (CONCERTA) 27 MG PO CR tablet Take 1 tablet (27 mg total) by mouth daily. 30 tablet 0  . montelukast (SINGULAIR) 10 MG tablet Take 1 tablet (10 mg total) by mouth at bedtime. 30 tablet 5  . PREMARIN vaginal cream PLEASE SEE ATTACHED FOR DETAILED DIRECTIONS    . rosuvastatin (CRESTOR) 5 MG tablet     . simethicone (MYLICON) 299 MG chewable tablet Chew 125 mg by mouth every 6 (six) hours as needed for flatulence.    [name] tiZANidine (ZANAFLEX) 4 MG tablet     . YUVAFEM 10 MCG TABS vaginal tablet Place 1 tablet vaginally 2 (two) times a week.     No current facility-administered medications for this visit.    Medication Side Effects: None  Allergies:  Allergies  Allergen Reactions  . Erythromycin Nausea And Vomiting  . Prednisone   . Flagyl [Metronidazole] Rash  . Penicillins Rash    Past Medical History:  Diagnosis Date  . Anemia    as a child   . Anxiety   . Asthma   . Back pain   . Chronic fatigue syndrome   . Complication of anesthesia    slow to wake up age 54, but since cholecystectomy and no problems   . Constipation   . Depression   . Diabetes ([place])   . Family history of anesthesia complication    father slow to wake up   . Fatty liver   . Fibromyalgia   . GERD (gastroesophageal reflux disease)   . Gestational diabetes   . Headache   . High blood pressure   . High cholesterol   . Hypertension   . IBS (irritable bowel syndrome)   . Joint pain   . Lactose intolerance   . Osteopenia   . SOB (shortness of breath)     Family History  Problem Relation Age of Onset  . Breast cancer Maternal Aunt   . Allergic rhinitis Mother   . Asthma Mother   . High blood pressure Mother   . High Cholesterol Mother   . Depression Mother   . Anxiety disorder Mother    . Alcoholism Mother   . Eczema Father   . High blood pressure Father   . High Cholesterol Father   . Heart disease Father   . Depression Father   . Liver disease Father   . Alcoholism Father   . AAA (abdominal aortic aneurysm) Father   . Angioedema Maternal Grandfather   . Urticaria Maternal Grandfather     Social History   Socioeconomic History  . Marital status: Married    Spouse name: Rush Landmark  . Number of children: Not on file  . Years of education: Not  on file  . Highest education level: Not on file  Occupational History  . 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 Smoking status: Never Smoker  . Smokeless tobacco: Never Used  Substance and Sexual Activity  . Alcohol use: Yes    Comment: socially   . Drug use: No  . Sexual activity: Not on file  Other Topics Concern  . Not on file  Social History Narrative  . Not on file   Social Determinants of Health   Financial Resource Strain: Not on file  Food Insecurity: Not on file  Transportation Needs: Not on file  Physical Activity: Not on file  Stress: Not on file  Social Connections: Not on file  Intimate Partner Violence: Not on file    Past Medical History, Surgical history, Social history, and Family history were reviewed and updated as appropriate.   Please see review of systems for further details on the patient's review from today.   Objective:   Physical Exam:  There were no vitals taken for this visit.  Physical Exam Constitutional:      General: She is not in acute distress. Musculoskeletal:        General: No deformity.  Neurological:     Mental Status: She is alert and oriented to person, place, and time.     Coordination: Coordination normal.  Psychiatric:        Attention and Perception: Attention and perception normal. She does not perceive auditory or visual hallucinations.        Mood and Affect: Mood normal. Mood is not anxious or depressed. Affect is not  labile, blunt, angry or inappropriate.        Speech: Speech normal.        Behavior: Behavior normal.        Thought Content: Thought content normal. Thought content is not paranoid or delusional. Thought content does not include homicidal or suicidal ideation. Thought content does not include homicidal or suicidal plan.        Cognition and Memory: Cognition and memory normal.        Judgment: Judgment normal.     Comments: Insight intact     Lab Review:     Component Value Date/Time   NA 142 07/05/2019 1048   K 4.7 07/05/2019 1048   CL 99 07/05/2019 1048   CO2 22 07/05/2019 1048   GLUCOSE 167 (H) 07/05/2019 1048   GLUCOSE 192 (H) 09/20/2014 0445   BUN 12 07/05/2019 1048   CREATININE 0.65 07/05/2019 1048   CALCIUM 10.1 07/05/2019 1048   PROT 7.2 07/05/2019 1048   ALBUMIN 5.1 (H) 07/05/2019 1048   AST 26 07/05/2019 1048   ALT 56 (H) 07/05/2019 1048   ALKPHOS 59 07/05/2019 1048   BILITOT 0.5 07/05/2019 1048   GFRNONAA 101 07/05/2019 1048   GFRAA 116 07/05/2019 1048       Component Value Date/Time   WBC 8.1 01/30/2019 1808   WBC 9.2 09/12/2014 1515   RBC 5.14 01/30/2019 1808   RBC 4.88 09/12/2014 1515   HGB 14.7 01/30/2019 1808   HCT 43.2 01/30/2019 1808   PLT 363 01/30/2019 1808   MCV 84 01/30/2019 1808   MCH 28.6 01/30/2019 1808   MCH 29.9 09/12/2014 1515   MCHC 34.0 01/30/2019 1808   MCHC 33.7 09/12/2014 1515   RDW 13.3 01/30/2019 1808   LYMPHSABS 3.5 (H) 01/30/2019 1808   MONOABS 0.5 09/05/2010 2335   EOSABS 0.1 01/30/2019 1808  BASOSABS 0.1 01/30/2019 1808    No results found for: POCLITH, LITHIUM   No results found for: PHENYTOIN, PHENOBARB, VALPROATE, CBMZ   .res Assessment: Plan:    Plan:  PDMP reviewed  1. Lunesta 2mg  at bedtime for sleep 2. Lexapro 20mg  daily 3. Increase Concerta 18mg  to 27mg  daily  106/75 90  RTC 4 weeks  Patient advised to contact office with any questions, adverse effects, or acute worsening in signs and  symptoms.  Discussed potential metabolic side effects associated with atypical antipsychotics, as well as potential risk for movement side effects. Advised pt to contact office if movement side effects occur.   Discussed potential benefits, risks, and side effects of stimulants with patient to include increased heart rate, palpitations, insomnia, increased anxiety, increased irritability, or decreased appetite.  Instructed patient to contact office if experiencing any significant tolerability issues.   Diagnoses and all orders for this visit:  Fibromyalgia  Insomnia, unspecified type -     eszopiclone (LUNESTA) 2 MG TABS tablet; Take 1 tablet (2 mg total) by mouth at bedtime as needed for sleep. Take immediately before bedtime  Cognitive attention deficit -     methylphenidate (CONCERTA) 27 MG PO CR tablet; Take 1 tablet (27 mg total) by mouth daily.  Major depressive disorder, recurrent episode, moderate (HCC)  Generalized anxiety disorder     Please see After Visit Summary for patient specific instructions.  Future Appointments  Date Time Provider [place]  11/26/2020  7:30 AM Kathrynn Ducking, MD [facility] None  12/23/2020  4:00 PM [facility] 3 [facility]    No orders of the defined types were placed in this encounter.   -------------------------------

## 2020-11-26 ENCOUNTER — Other Ambulatory Visit: Payer: Self-pay

## 2020-11-26 ENCOUNTER — Encounter: Payer: Self-pay | Admitting: Neurology

## 2020-11-26 ENCOUNTER — Ambulatory Visit (INDEPENDENT_AMBULATORY_CARE_PROVIDER_SITE_OTHER): Payer: 59 | Admitting: Neurology

## 2020-11-26 VITALS — BP 147/85 | HR 87 | Ht 67.0 in | Wt 202.5 lb

## 2020-11-26 DIAGNOSIS — M797 Fibromyalgia: Secondary | ICD-10-CM | POA: Diagnosis not present

## 2020-11-26 MED ORDER — OXYCODONE HCL 5 MG PO TABS
5.0000 mg | ORAL_TABLET | Freq: Four times a day (QID) | ORAL | 0 refills | Status: DC | PRN
Start: 2020-11-26 — End: 2021-06-04

## 2020-11-26 NOTE — Progress Notes (Signed)
Reason for visit: Fibromyalgia  Rhonda Bradley is an 54 y.o. female  History of present illness:  Rhonda Bradley is a 54 year old right-handed white female with a history of fibromyalgia, depression and anxiety, and a history of insomnia.  The patient has total body pain, she has headaches, eye pain, severe shoulder and arm discomfort, she has a sensation of spasms in her stomach and back and has pain in the legs.  She does do some stretches, she has difficulty performing physical activity as it worsens her discomfort.  She has difficulty with sleeping.  She takes tizanidine only in the evenings, 4 mg.  In the past she has been on Lyrica, gabapentin, and Cymbalta which increased her muscular discomfort.  Seroquel did the same thing.  She has been on amitriptyline which did not help her discomfort.  The patient reports itching in the feet, and she has problems with episodes of dizziness.  She cannot sit or stand for long periods of time.  She currently is no longer working, she is applying for disability.  She is severely functionally disabled from her chronic pain.  Past Medical History:  Diagnosis Date  . Anemia    as a child   . Anxiety   . Asthma   . Back pain   . Chronic fatigue syndrome   . Complication of anesthesia    slow to wake up age 66, but since cholecystectomy and no problems   . Constipation   . Depression   . Diabetes ([place])   . Family history of anesthesia complication    father slow to wake up   . Fatty liver   . Fibromyalgia   . GERD (gastroesophageal reflux disease)   . Gestational diabetes   . Headache   . High blood pressure   . High cholesterol   . Hypertension   . IBS (irritable bowel syndrome)   . Joint pain   . Lactose intolerance   . Osteopenia   . SOB (shortness of breath)     Past Surgical History:  Procedure Laterality Date  . BREAST BIOPSY    . BREAST EXCISIONAL BIOPSY    . CESAREAN SECTION     x 3  . CHOLECYSTECTOMY    .  THYROIDECTOMY N/A 09/19/2014   Procedure: THYROIDECTOMY;  Surgeon: Armandina Gemma, MD;  Location: [facility];  Service: General;  Laterality: N/A;    Family History  Problem Relation Age of Onset  . Breast cancer Maternal Aunt   . Allergic rhinitis Mother   . Asthma Mother   . High blood pressure Mother   . High Cholesterol Mother   . Depression Mother   . Anxiety disorder Mother   . Alcoholism Mother   . Eczema Father   . High blood pressure Father   . High Cholesterol Father   . Heart disease Father   . Depression Father   . Liver disease Father   . Alcoholism Father   . AAA (abdominal aortic aneurysm) Father   . Angioedema Maternal Grandfather   . Urticaria Maternal Grandfather     Social history:  reports that she has never smoked. She has never used smokeless tobacco. She reports current alcohol use. She reports that she does not use drugs.    Allergies  Allergen Reactions  . Erythromycin Nausea And Vomiting  . Prednisone   . Flagyl [Metronidazole] Rash  . Penicillins Rash    Medications:  Prior to Admission medications   Medication Sig Start Date  End Date Taking? Authorizing Provider  albuterol (PROVENTIL HFA;VENTOLIN HFA) 108 (90 Base) MCG/ACT inhaler Inhale 2 puffs into the lungs every 4 (four) hours as needed for wheezing or shortness of breath. 07/25/18   Kozlow, Donnamarie Poag, MD  alendronate (FOSAMAX) 70 MG tablet PLEASE SEE ATTACHED FOR DETAILED DIRECTIONS 02/05/20   [provider]  Azelastine-Fluticasone 137-50 MCG/ACT SUSP SPRAY 2 SPRAYS INTO EACH NOSTRIL EVERY DAY 12/05/19   Kozlow, Donnamarie Poag, MD  Black Pepper-Turmeric (TURMERIC COMPLEX/BLACK PEPPER PO) Take by mouth.    [provider]  budesonide-formoterol (SYMBICORT) 160-4.5 MCG/ACT inhaler INHALE TWO PUFFS TWICE DAILY TO PREVENT COUGH OR WHEEZE. RINSE MOUTH AFTER USE. 10/16/19   Kozlow, Donnamarie Poag, MD  Cholecalciferol (VITAMIN D3) 25 MCG (1000 UT) CAPS Take by mouth.    [provider]  COLLAGEN  PO Take by mouth.    [provider]  cyclobenzaprine (FLEXERIL) 5 MG tablet Take 5 mg by mouth 3 (three) times daily as needed for muscle spasms.    [provider]  escitalopram (LEXAPRO) 10 MG tablet Take 10 mg by mouth daily. 07/17/20   [provider]  escitalopram (LEXAPRO) 20 MG tablet TAKE 1 TABLET BY MOUTH EVERY DAY 10/10/20   Mozingo, Berdie Ogren, NP  esomeprazole (NEXIUM) 40 MG capsule Take 40 mg by mouth 2 (two) times daily. 02/18/20   [provider]  eszopiclone (LUNESTA) 2 MG TABS tablet Take 1 tablet (2 mg total) by mouth at bedtime as needed for sleep. Take immediately before bedtime 11/20/20   Mozingo, Berdie Ogren, NP  furosemide (LASIX) 40 MG tablet Take 80 mg by mouth daily. Patient takes as needed per patient     [provider]  glucosamine-chondroitin 500-400 MG tablet Take 1 tablet by mouth 3 (three) times daily.    [provider]  irbesartan (AVAPRO) 75 MG tablet Take 75 mg by mouth daily. 03/16/19   [provider]  levothyroxine (SYNTHROID, LEVOTHROID) 100 MCG tablet TAKE 1 TABLET ONCE DAILY ON AN EMPTY STOMACH 30 MINUTES BEFORE BREAKFAST 12/04/17   [provider]  liothyronine (CYTOMEL) 5 MCG tablet TAKE 1 TABLET BY MOUTH TWICE A DAY ON AN EMPTY STOMACH 12/16/17   [provider]  loratadine (CLARITIN) 10 MG tablet Take 10 mg by mouth every morning.    [provider]  magnesium gluconate (MAGONATE) 500 MG tablet Take 500 mg by mouth 2 (two) times daily.    [provider]  meloxicam (MOBIC) 15 MG tablet 1 TABLET ONCE A DAY AS NEEDED ORALLY 30 12/26/17   [provider]  metFORMIN (GLUCOPHAGE-XR) 500 MG 24 hr tablet 3 TABLETS WITH EVENING MEAL ONCE A DAY ORALLY 30 DAY(S) 11/04/17   [provider]  methylphenidate (CONCERTA) 27 MG PO CR tablet Take 1 tablet (27 mg total) by mouth daily. 11/20/20   Mozingo, Berdie Ogren, NP  montelukast (SINGULAIR) 10  MG tablet Take 1 tablet (10 mg total) by mouth at bedtime. 01/30/19   Kozlow, Donnamarie Poag, MD  PREMARIN vaginal cream PLEASE SEE ATTACHED FOR DETAILED DIRECTIONS 02/26/19   [provider]  rosuvastatin (CRESTOR) 5 MG tablet  07/25/18   [provider]  simethicone (MYLICON) 267 MG chewable tablet Chew 125 mg by mouth every 6 (six) hours as needed for flatulence.    [provider]  tiZANidine (ZANAFLEX) 4 MG tablet  01/09/18   [provider]  YUVAFEM 10 MCG TABS vaginal tablet Place 1 tablet vaginally 2 (two) times a  week. 01/03/20   [provider]    ROS:  Out of a complete 14 system review of symptoms, the patient complains only of the following symptoms, and all other reviewed systems are negative.  Diffuse neuromuscular pain Insomnia Headache  Blood pressure (!) 147/85, pulse 87, height 5\' 7"  (1.702 m), weight 202 lb 8 oz (91.9 kg).  Physical Exam  General: The patient is alert and cooperative at the time of the examination.  The patient is moderately obese.  Skin: No significant peripheral edema is noted.   Neurologic Exam  Mental status: The patient is alert and oriented x 3 at the time of the examination. The patient has apparent normal recent and remote memory, with an apparently normal attention span and concentration ability.   Cranial nerves: Facial symmetry is present. Speech is normal, no aphasia or dysarthria is noted. Extraocular movements are full. Visual fields are full.  Motor: The patient has good strength in all 4 extremities.  Sensory examination: Soft touch sensation is symmetric on the face, arms, and legs.  Coordination: The patient has good finger-nose-finger and heel-to-shin bilaterally.  Gait and station: The patient has a normal gait. Tandem gait is slightly unsteady. Romberg is negative. No drift is seen.  Reflexes: Deep tendon reflexes are symmetric.   Assessment/Plan:  1.  Fibromyalgia  The patient has  severe symptoms from fibromyalgia.  She has a sensation of muscle spasms in the back and abdomen, I will go up on the tizanidine taking 2 mg in the morning and midday, 4 mg in the evening.  The patient will be given a small prescription for oxycodone to take intermittently, I have cautioned her about taking medication such as this daily as she will develop tolerance and the pain level will increase.  Intermittent use is less likely to cause tolerance.  She will follow up here in 6 months.  Jill Alexanders MD 11/26/2020 7:28 AM  [facility] [address] [place], [place] [address]  Phone [phone] Fax [phone]

## 2020-11-26 NOTE — Patient Instructions (Signed)
Try increasing the tizanidine to 1/2 tablet in the morning and midday, and one in the evening.

## 2020-12-01 ENCOUNTER — Telehealth: Payer: Self-pay | Admitting: Neurology

## 2020-12-01 NOTE — Telephone Encounter (Signed)
Pt called stating that she is needing her last office notes faxed over to [employer] to fax number [phone]

## 2020-12-08 NOTE — Telephone Encounter (Signed)
done

## 2020-12-16 ENCOUNTER — Telehealth: Payer: Self-pay | Admitting: Adult Health

## 2020-12-16 ENCOUNTER — Other Ambulatory Visit: Payer: Self-pay | Admitting: Adult Health

## 2020-12-16 DIAGNOSIS — R41840 Attention and concentration deficit: Secondary | ICD-10-CM

## 2020-12-16 MED ORDER — METHYLPHENIDATE HCL ER (OSM) 36 MG PO TBCR: 36.0000 mg | EXTENDED_RELEASE_TABLET | Freq: Every day | ORAL | 0 refills | Status: DC

## 2020-12-16 NOTE — Telephone Encounter (Signed)
Pt advised and agreed 

## 2020-12-16 NOTE — Telephone Encounter (Signed)
Pls advise patient - I have sent in a script for Concerta 36mg  every morning.

## 2020-12-16 NOTE — Telephone Encounter (Signed)
Pt called to canc 1/13 apt. Has covid. Reporting she can tell a difference with Concerta. Asking if it may be a benefit to increase to next mg. Contact # [phone] or stated you can respond with mychart message.

## 2020-12-18 ENCOUNTER — Ambulatory Visit: Payer: 59 | Admitting: Adult Health

## 2020-12-23 ENCOUNTER — Inpatient Hospital Stay: Admission: RE | Admit: 2020-12-23 | Payer: 59 | Source: Ambulatory Visit

## 2021-02-06 ENCOUNTER — Other Ambulatory Visit: Payer: Self-pay

## 2021-02-06 ENCOUNTER — Inpatient Hospital Stay: Admission: RE | Admit: 2021-02-06 | Payer: 59 | Source: Ambulatory Visit

## 2021-02-06 ENCOUNTER — Ambulatory Visit
Admission: RE | Admit: 2021-02-06 | Discharge: 2021-02-06 | Disposition: A | Discharge status: Home / Self Care | Payer: 59 | Source: Ambulatory Visit | Attending: Geriatric Medicine | Admitting: Geriatric Medicine

## 2021-02-06 DIAGNOSIS — Z1231 Encounter for screening mammogram for malignant neoplasm of breast: Secondary | ICD-10-CM

## 2021-03-31 ENCOUNTER — Other Ambulatory Visit: Payer: Self-pay | Admitting: Adult Health

## 2021-03-31 DIAGNOSIS — G4700 Insomnia, unspecified: Secondary | ICD-10-CM

## 2021-03-31 NOTE — Telephone Encounter (Signed)
Please schedule appt

## 2021-03-31 NOTE — Telephone Encounter (Signed)
Pt has an appt on 04/20/21

## 2021-04-01 ENCOUNTER — Other Ambulatory Visit: Payer: Self-pay

## 2021-04-01 ENCOUNTER — Telehealth: Payer: Self-pay | Admitting: Adult Health

## 2021-04-01 DIAGNOSIS — G4700 Insomnia, unspecified: Secondary | ICD-10-CM

## 2021-04-01 MED ORDER — ESZOPICLONE 2 MG PO TABS: 2.0000 mg | ORAL_TABLET | Freq: Every evening | ORAL | 0 refills | Status: DC | PRN

## 2021-04-01 NOTE — Telephone Encounter (Signed)
Yes

## 2021-04-01 NOTE — Telephone Encounter (Signed)
Pt called and would like a different sleep aid than lunesta. She said that it cost her over $ 100. She can't afford that . She has an appt in 5/16. [facility] [address]

## 2021-04-01 NOTE — Telephone Encounter (Signed)
Rx sent 

## 2021-04-01 NOTE — Telephone Encounter (Signed)
Please review

## 2021-04-01 NOTE — Telephone Encounter (Signed)
Same message.

## 2021-04-01 NOTE — Telephone Encounter (Signed)
Does that mean It should be cheaper,should I check good rx?

## 2021-04-01 NOTE — Telephone Encounter (Signed)
Rhonda Bradley is generic.

## 2021-04-01 NOTE — Telephone Encounter (Signed)
I sent her a coupon for [facility].She would like a Rx sent to the one on [address].Ok to send?

## 2021-04-01 NOTE — Telephone Encounter (Signed)
Noted  

## 2021-04-02 ENCOUNTER — Other Ambulatory Visit: Payer: Self-pay | Admitting: Adult Health

## 2021-04-02 DIAGNOSIS — G4700 Insomnia, unspecified: Secondary | ICD-10-CM

## 2021-04-02 MED ORDER — ESZOPICLONE 2 MG PO TABS: 2.0000 mg | ORAL_TABLET | Freq: Every evening | ORAL | 0 refills | Status: DC | PRN

## 2021-04-02 NOTE — Telephone Encounter (Signed)
It looks like it still went to [facility]

## 2021-04-02 NOTE — Telephone Encounter (Signed)
T[place]ria just called because she was checking status of her Lunesta refill.  Pharmacy doesn't have it.  It appears [facility] [address]  As noted in the original request it was to be sent to [facility] on [address].  Please transfer the prescription to [facility].

## 2021-04-03 ENCOUNTER — Other Ambulatory Visit: Payer: Self-pay | Admitting: Adult Health

## 2021-04-03 ENCOUNTER — Other Ambulatory Visit: Payer: Self-pay

## 2021-04-03 ENCOUNTER — Telehealth: Payer: Self-pay | Admitting: Adult Health

## 2021-04-03 DIAGNOSIS — G4700 Insomnia, unspecified: Secondary | ICD-10-CM

## 2021-04-03 DIAGNOSIS — R41840 Attention and concentration deficit: Secondary | ICD-10-CM

## 2021-04-03 MED ORDER — METHYLPHENIDATE HCL ER (OSM) 54 MG PO TBCR: 54.0000 mg | EXTENDED_RELEASE_TABLET | Freq: Every day | ORAL | 0 refills | Status: DC

## 2021-04-03 MED ORDER — ESZOPICLONE 2 MG PO TABS: 2.0000 mg | ORAL_TABLET | Freq: Every evening | ORAL | 0 refills | Status: DC | PRN

## 2021-04-03 NOTE — Telephone Encounter (Signed)
Script sent  

## 2021-04-03 NOTE — Telephone Encounter (Signed)
FYI, [facility] actually [facility], it printed out instead. There were too many characters in the space for pharmacy notes. Those were removed. [facility]. [facility]

## 2021-04-03 NOTE — Telephone Encounter (Signed)
Pt stated you told her to just call if she wanted to increase Please send to [facility] and let me know the dose so we can send her a Good Rx coupon as requested

## 2021-04-03 NOTE — Telephone Encounter (Signed)
[facility]. I will call in to [facility] as requested instead and cancel at [facility].

## 2021-04-03 NOTE — Telephone Encounter (Signed)
Pt called about increasing the concerta to higher dose and would like a new script [facility]

## 2021-04-04 ENCOUNTER — Other Ambulatory Visit: Payer: Self-pay | Admitting: Adult Health

## 2021-04-04 DIAGNOSIS — F411 Generalized anxiety disorder: Secondary | ICD-10-CM

## 2021-04-04 DIAGNOSIS — F331 Major depressive disorder, recurrent, moderate: Secondary | ICD-10-CM

## 2021-04-09 ENCOUNTER — Other Ambulatory Visit: Payer: Self-pay | Admitting: Adult Health

## 2021-04-09 DIAGNOSIS — F411 Generalized anxiety disorder: Secondary | ICD-10-CM

## 2021-04-09 DIAGNOSIS — F331 Major depressive disorder, recurrent, moderate: Secondary | ICD-10-CM

## 2021-04-09 MED ORDER — ESCITALOPRAM OXALATE 20 MG PO TABS: 1.0000 | ORAL_TABLET | Freq: Every day | ORAL | 3 refills | Status: DC

## 2021-04-20 ENCOUNTER — Other Ambulatory Visit: Payer: Self-pay

## 2021-04-20 ENCOUNTER — Encounter: Payer: Self-pay | Admitting: Adult Health

## 2021-04-20 ENCOUNTER — Ambulatory Visit (INDEPENDENT_AMBULATORY_CARE_PROVIDER_SITE_OTHER): Payer: 59 | Admitting: Adult Health

## 2021-04-20 DIAGNOSIS — F411 Generalized anxiety disorder: Secondary | ICD-10-CM

## 2021-04-20 DIAGNOSIS — R41840 Attention and concentration deficit: Secondary | ICD-10-CM | POA: Diagnosis not present

## 2021-04-20 DIAGNOSIS — F331 Major depressive disorder, recurrent, moderate: Secondary | ICD-10-CM | POA: Diagnosis not present

## 2021-04-20 DIAGNOSIS — G4700 Insomnia, unspecified: Secondary | ICD-10-CM | POA: Diagnosis not present

## 2021-04-20 MED ORDER — METHYLPHENIDATE HCL ER (OSM) 54 MG PO TBCR: 54.0000 mg | EXTENDED_RELEASE_TABLET | Freq: Every day | ORAL | 0 refills | Status: DC

## 2021-04-20 MED ORDER — ESZOPICLONE 2 MG PO TABS
2.0000 mg | ORAL_TABLET | Freq: Every evening | ORAL | 2 refills | Status: DC | PRN
Start: 2021-04-20 — End: 2021-05-06

## 2021-04-20 NOTE — Progress Notes (Signed)
Rhonda Bradley [id number] 09-05-1966 55 y.o.  Subjective:   Patient ID:  Rhonda Bradley is a 55 y.o. (DOB Nov 19, 1966) female.  Chief Complaint: No chief complaint on file.   HPI STORY CONTI presents to the office today for follow-up of GAD, MDD, insomnia, and adjustment disorder.  Describes mood today as "ok". Pleasant. Tearful at times. Mood symptoms - decreased depression, anxiety, and irritability. Stating "I think I'm doing ok". Feels like Lexapro at 20mg  and Lunesta 2mg  is working well for her. Addition of Concerta has been helpful with cognitive issues. Chronic pain and fatigue from Fibromyalgia. Diagnosed with Fibromyalgia in 2000.Varying interest and motivation. Taking medications as prescribed.  Energy levels "lower". Active, exercises when she is able.   Enjoys some usual interests and activities. Married. Lives with husband of 8 years. Has 3 children - 2 boys and 1 girl - 2 grandchildren. Family local.   Appetite adequate. Weight stable - 198 pounds. Sleeps well with Lunesta. Averages 8 hours - wakes up a few times a night.   Focus and concentration improved with Concerta - "I can tell a difference with it". Fibromyalgia - cognitive issues. MRI negative. Completing tasks. Managing aspects of household. Unable to work. Has applied for disability benefits. Denies SI or HI.  Denies AH or VH.  Previous medication trials: Wellbutrin SR, Wellbutrin XL, Cymbalta, Gabapentin  PHQ2-9   Flowsheet Row Office Visit from 07/05/2019 in [facility]  PHQ-2 Total Score 2  PHQ-9 Total Score 9       Review of Systems:  Review of Systems  Musculoskeletal: Negative for gait problem.  Neurological: Negative for tremors.  Psychiatric/Behavioral:       Please refer to HPI    Medications: I have reviewed the patient's current medications.  Current Outpatient Medications  Medication Sig Dispense Refill  . albuterol (PROVENTIL HFA;VENTOLIN HFA) 108 (90  Base) MCG/ACT inhaler Inhale 2 puffs into the lungs every 4 (four) hours as needed for wheezing or shortness of breath. 1 Inhaler 2  . alendronate (FOSAMAX) 70 MG tablet PLEASE SEE ATTACHED FOR DETAILED DIRECTIONS    . budesonide-formoterol (SYMBICORT) 160-4.5 MCG/ACT inhaler INHALE TWO PUFFS TWICE DAILY TO PREVENT COUGH OR WHEEZE. RINSE MOUTH AFTER USE. 10.2 Inhaler 0  . escitalopram (LEXAPRO) 10 MG tablet Take 20 mg by mouth daily.    [name] escitalopram (LEXAPRO) 20 MG tablet Take 1 tablet (20 mg total) by mouth daily. 90 tablet 3  . esomeprazole (NEXIUM) 40 MG capsule Take 40 mg by mouth 2 (two) times daily.    . eszopiclone (LUNESTA) 2 MG TABS tablet Take 1 tablet (2 mg total) by mouth at bedtime as needed for sleep. 30 tablet 2  . fluticasone (FLONASE) 50 MCG/ACT nasal spray Place 2 sprays into both nostrils 2 (two) times daily.    . furosemide (LASIX) 40 MG tablet Take 80 mg by mouth daily. Patient takes as needed per patient    . glimepiride (AMARYL) 2 MG tablet Take 2 mg by mouth daily with breakfast.    . irbesartan (AVAPRO) 75 MG tablet Take 37.5 mg by mouth daily.    [name] levothyroxine (SYNTHROID, LEVOTHROID) 100 MCG tablet Take 100 mcg by mouth daily before breakfast.  2  . liothyronine (CYTOMEL) 5 MCG tablet TAKE 1 TABLET BY MOUTH TWICE A DAY ON AN EMPTY STOMACH  5  . metFORMIN (GLUCOPHAGE-XR) 500 MG 24 hr tablet Take 500 mg by mouth in the morning and at bedtime.  11  .  methylphenidate (CONCERTA) 54 MG PO CR tablet Take 1 tablet (54 mg total) by mouth daily. 30 tablet 0  . montelukast (SINGULAIR) 10 MG tablet Take 1 tablet (10 mg total) by mouth at bedtime. 30 tablet 5  . oxyCODONE (OXY IR/ROXICODONE) 5 MG immediate release tablet Take 1 tablet (5 mg total) by mouth every 6 (six) hours as needed for severe pain. 30 tablet 0  . rosuvastatin (CRESTOR) 5 MG tablet Take 5 mg by mouth daily.    [name] tiZANidine (ZANAFLEX) 4 MG tablet Take 4 mg by mouth in the morning and at bedtime.    Merril Abbe 10  MCG TABS vaginal tablet Place 1 tablet vaginally 2 (two) times a week.     No current facility-administered medications for this visit.    Medication Side Effects: None  Allergies:  Allergies  Allergen Reactions  . 5ht3 Receptor Antagonists     Other reaction(s): Unknown  . Atorvastatin     Other reaction(s): muscle pain  . Erythromycin Nausea And Vomiting    Other reaction(s): Upset GI  . Prednisone   . Sulfamethoxazole-Trimethoprim     Other reaction(s): rash  . Flagyl [Metronidazole] Rash  . Other Rash  . Penicillins Rash    Past Medical History:  Diagnosis Date  . Anemia    as a child   . Anxiety   . Asthma   . Back pain   . Chronic fatigue syndrome   . Complication of anesthesia    slow to wake up age 58, but since cholecystectomy and no problems   . Constipation   . Depression   . Diabetes ([place])   . Family history of anesthesia complication    father slow to wake up   . Fatty liver   . Fibromyalgia   . GERD (gastroesophageal reflux disease)   . Gestational diabetes   . Headache   . High blood pressure   . High cholesterol   . Hypertension   . IBS (irritable bowel syndrome)   . Joint pain   . Lactose intolerance   . Osteopenia   . SOB (shortness of breath)     Past Medical History, Surgical history, Social history, and Family history were reviewed and updated as appropriate.   Please see review of systems for further details on the patient's review from today.   Objective:   Physical Exam:  There were no vitals taken for this visit.  Physical Exam Constitutional:      General: She is not in acute distress. Musculoskeletal:        General: No deformity.  Neurological:     Mental Status: She is alert and oriented to person, place, and time.     Coordination: Coordination normal.  Psychiatric:        Attention and Perception: Attention and perception normal. She does not perceive auditory or visual hallucinations.        Mood and Affect: Mood  normal. Mood is not anxious or depressed. Affect is not labile, blunt, angry or inappropriate.        Speech: Speech normal.        Behavior: Behavior normal.        Thought Content: Thought content normal. Thought content is not paranoid or delusional. Thought content does not include homicidal or suicidal ideation. Thought content does not include homicidal or suicidal plan.        Cognition and Memory: Cognition and memory normal.        Judgment: Judgment  normal.     Comments: Insight intact     Lab Review:     Component Value Date/Time   NA 142 07/05/2019 1048   K 4.7 07/05/2019 1048   CL 99 07/05/2019 1048   CO2 22 07/05/2019 1048   GLUCOSE 167 (H) 07/05/2019 1048   GLUCOSE 192 (H) 09/20/2014 0445   BUN 12 07/05/2019 1048   CREATININE 0.65 07/05/2019 1048   CALCIUM 10.1 07/05/2019 1048   PROT 7.2 07/05/2019 1048   ALBUMIN 5.1 (H) 07/05/2019 1048   AST 26 07/05/2019 1048   ALT 56 (H) 07/05/2019 1048   ALKPHOS 59 07/05/2019 1048   BILITOT 0.5 07/05/2019 1048   GFRNONAA 101 07/05/2019 1048   GFRAA 116 07/05/2019 1048       Component Value Date/Time   WBC 8.1 01/30/2019 1808   WBC 9.2 09/12/2014 1515   RBC 5.14 01/30/2019 1808   RBC 4.88 09/12/2014 1515   HGB 14.7 01/30/2019 1808   HCT 43.2 01/30/2019 1808   PLT 363 01/30/2019 1808   MCV 84 01/30/2019 1808   MCH 28.6 01/30/2019 1808   MCH 29.9 09/12/2014 1515   MCHC 34.0 01/30/2019 1808   MCHC 33.7 09/12/2014 1515   RDW 13.3 01/30/2019 1808   LYMPHSABS 3.5 (H) 01/30/2019 1808   MONOABS 0.5 09/05/2010 2335   EOSABS 0.1 01/30/2019 1808   BASOSABS 0.1 01/30/2019 1808    No results found for: POCLITH, LITHIUM   No results found for: PHENYTOIN, PHENOBARB, VALPROATE, CBMZ   .res Assessment: Plan:    Plan:  PDMP reviewed  1. Lunesta 2mg  at bedtime for sleep 2. Lexapro 20mg  daily 3. Concerta 54mg  daily  106/67 - 81  RTC 6 pounds  Patient advised to contact office with any questions, adverse effects,  or acute worsening in signs and symptoms.  Discussed potential metabolic side effects associated with atypical antipsychotics, as well as potential risk for movement side effects. Advised pt to contact office if movement side effects occur.   Discussed potential benefits, risks, and side effects of stimulants with patient to include increased heart rate, palpitations, insomnia, increased anxiety, increased irritability, or decreased appetite.  Instructed patient to contact office if experiencing any significant tolerability issues.   Diagnoses and all orders for this visit:  Generalized anxiety disorder  Cognitive attention deficit -     methylphenidate (CONCERTA) 54 MG PO CR tablet; Take 1 tablet (54 mg total) by mouth daily.  Insomnia, unspecified type -     eszopiclone (LUNESTA) 2 MG TABS tablet; Take 1 tablet (2 mg total) by mouth at bedtime as needed for sleep.  Major depressive disorder, recurrent episode, moderate ([place])     Please see After Visit Summary for patient specific instructions.  Future Appointments  Date Time Provider [place]  05/27/2021  8:00 AM Kathrynn Ducking, MD [facility] None  10/21/2021  3:20 PM , Berdie Ogren, NP [facility] None    No orders of the defined types were placed in this encounter.   -------------------------------

## 2021-05-05 ENCOUNTER — Other Ambulatory Visit: Payer: Self-pay | Admitting: Adult Health

## 2021-05-05 DIAGNOSIS — G4700 Insomnia, unspecified: Secondary | ICD-10-CM

## 2021-05-27 ENCOUNTER — Encounter: Payer: Self-pay | Admitting: Neurology

## 2021-05-27 ENCOUNTER — Ambulatory Visit: Payer: 59 | Admitting: Neurology

## 2021-05-27 ENCOUNTER — Telehealth: Payer: Self-pay | Admitting: Neurology

## 2021-05-27 NOTE — Telephone Encounter (Signed)
This patient did not show for a revisit appointment today. 

## 2021-06-02 DIAGNOSIS — Z0289 Encounter for other administrative examinations: Secondary | ICD-10-CM

## 2021-06-03 ENCOUNTER — Telehealth: Payer: Self-pay | Admitting: Adult Health

## 2021-06-03 ENCOUNTER — Other Ambulatory Visit: Payer: Self-pay | Admitting: Adult Health

## 2021-06-03 ENCOUNTER — Other Ambulatory Visit: Payer: Self-pay

## 2021-06-03 DIAGNOSIS — G4700 Insomnia, unspecified: Secondary | ICD-10-CM

## 2021-06-03 DIAGNOSIS — R41840 Attention and concentration deficit: Secondary | ICD-10-CM

## 2021-06-03 MED ORDER — METHYLPHENIDATE HCL ER (OSM) 36 MG PO TBCR: 36.0000 mg | EXTENDED_RELEASE_TABLET | Freq: Every day | ORAL | 0 refills | Status: DC

## 2021-06-03 MED ORDER — ESZOPICLONE 3 MG PO TABS
ORAL_TABLET | ORAL | 2 refills | Status: DC
Start: 2021-06-03 — End: 2021-09-01

## 2021-06-03 NOTE — Telephone Encounter (Signed)
Script sent - Concerta 36mg  - take every morning.

## 2021-06-03 NOTE — Telephone Encounter (Signed)
Please review request to reduce concerta

## 2021-06-03 NOTE — Telephone Encounter (Signed)
Lunesta pended as well but pt mentioned you would possibly increase to 3mg .If so you will have to change rx

## 2021-06-03 NOTE — Telephone Encounter (Signed)
Pt called requesting refill for Lunesta @ [facility] And mentioned would like to decrease Concerta dose from 54 mg to next step down dose, 54 mg makes her panicky feeling. Apt 11/16

## 2021-06-03 NOTE — Telephone Encounter (Signed)
Script sent with change to 3mg  at hs.

## 2021-06-04 ENCOUNTER — Ambulatory Visit (INDEPENDENT_AMBULATORY_CARE_PROVIDER_SITE_OTHER): Payer: 59 | Admitting: Neurology

## 2021-06-04 ENCOUNTER — Other Ambulatory Visit: Payer: Self-pay

## 2021-06-04 ENCOUNTER — Encounter: Payer: Self-pay | Admitting: Neurology

## 2021-06-04 VITALS — BP 128/75 | HR 80 | Ht 67.0 in | Wt 199.0 lb

## 2021-06-04 DIAGNOSIS — M797 Fibromyalgia: Secondary | ICD-10-CM

## 2021-06-04 NOTE — Progress Notes (Signed)
I have read the note, and I agree with the clinical assessment and plan.   K    

## 2021-06-04 NOTE — Patient Instructions (Signed)
Referral to [facility] for pain management Continue to see your primary care doctor Return here as needed

## 2021-06-04 NOTE — Progress Notes (Signed)
PATIENT: Rhonda Bradley DOB: [birth year]  REASON FOR VISIT: follow up HISTORY FROM: patient Primary Neurologist: Dr. Jannifer Franklin   Today 06/04/21 Rhonda Bradley is a 55 year old female with history of fibromyalgia, depression, anxiety, insomnia, chronic total body pain. Today is a bad week with fibro flare. A1C is 10. Last visit, Dr. Jannifer Franklin gave small supply of oxycodone. She didn't think it was helpful, still has some left. Went to dentist in past found hydrocodone to be more helpful than the oxycodone. The pain is total body, right thigh stays sore, even finger tips hurt. Has tried Savella, Lyrica, gabapentin, Cymbalta, Seroquel, amitriptyline without benefit or had side effect. In March 2021, quit work, is trying to get disability. Hard to stand, sit for extended periods of time. Gets nauseated, stomach pain daily. Saw GI years ago, work up unrevealing. Doesn't shop alone, doesn't feel confident in being alone. Can drive herself. MRI brain in May 2020 was normal. Is now on Concerta to help with word recall.  Lunesta for sleep helps. Here today alone.   HISTORY 11/26/2020 Dr. Jannifer Franklin: Rhonda Bradley is a 55 year old right-handed white female with a history of fibromyalgia, depression and anxiety, and a history of insomnia.  The patient has total body pain, she has headaches, eye pain, severe shoulder and arm discomfort, she has a sensation of spasms in her stomach and back and has pain in the legs.  She does do some stretches, she has difficulty performing physical activity as it worsens her discomfort.  She has difficulty with sleeping.  She takes tizanidine only in the evenings, 4 mg.  In the past she has been on Lyrica, gabapentin, and Cymbalta which increased her muscular discomfort.  Seroquel did the same thing.  She has been on amitriptyline which did not help her discomfort.  The patient reports itching in the feet, and she has problems with episodes of dizziness.  She cannot sit or stand for  long periods of time.  She currently is no longer working, she is applying for disability.  She is severely functionally disabled from her chronic pain.   REVIEW OF SYSTEMS: Out of a complete 14 system review of symptoms, the patient complains only of the following symptoms, and all other reviewed systems are negative.  See HPI  ALLERGIES: Allergies  Allergen Reactions   5ht3 Receptor Antagonists     Other reaction(s): Unknown   Atorvastatin     Other reaction(s): muscle pain   Erythromycin Nausea And Vomiting    Other reaction(s): Upset GI   Prednisone    Sulfamethoxazole-Trimethoprim     Other reaction(s): rash   Flagyl [Metronidazole] Rash   Other Rash   Penicillins Rash    HOME MEDICATIONS: Outpatient Medications Prior to Visit  Medication Sig Dispense Refill   albuterol (PROVENTIL HFA;VENTOLIN HFA) 108 (90 Base) MCG/ACT inhaler Inhale 2 puffs into the lungs every 4 (four) hours as needed for wheezing or shortness of breath. 1 Inhaler 2   budesonide-formoterol (SYMBICORT) 160-4.5 MCG/ACT inhaler INHALE TWO PUFFS TWICE DAILY TO PREVENT COUGH OR WHEEZE. RINSE MOUTH AFTER USE. 10.2 Inhaler 0   escitalopram (LEXAPRO) 20 MG tablet Take 1 tablet (20 mg total) by mouth daily. 90 tablet 3   esomeprazole (NEXIUM) 40 MG capsule Take 40 mg by mouth 2 (two) times daily.     eszopiclone 3 MG TABS TAKE ONE TABLET BY MOUTH DAILY AT BEDTIME FOR INSOMNIA 30 tablet 2   fluticasone (FLONASE) 50 MCG/ACT nasal spray Place 2 sprays into both  nostrils 2 (two) times daily.     furosemide (LASIX) 40 MG tablet Take 80 mg by mouth daily. Patient takes as needed per patient     glimepiride (AMARYL) 2 MG tablet Take 2 mg by mouth daily with breakfast.     irbesartan (AVAPRO) 75 MG tablet Take 37.5 mg by mouth daily.     levothyroxine (SYNTHROID, LEVOTHROID) 100 MCG tablet Take 100 mcg by mouth daily before breakfast.  2   liothyronine (CYTOMEL) 5 MCG tablet TAKE 1 TABLET BY MOUTH TWICE A DAY ON AN EMPTY  STOMACH  5   metFORMIN (GLUCOPHAGE-XR) 500 MG 24 hr tablet Take 500 mg by mouth in the morning and at bedtime.  11   methylphenidate (CONCERTA) 36 MG PO CR tablet Take 1 tablet (36 mg total) by mouth daily. 30 tablet 0   methylphenidate (CONCERTA) 54 MG PO CR tablet Take 1 tablet (54 mg total) by mouth daily. 30 tablet 0   montelukast (SINGULAIR) 10 MG tablet Take 1 tablet (10 mg total) by mouth at bedtime. 30 tablet 5   oxyCODONE (OXY IR/ROXICODONE) 5 MG immediate release tablet Take 1 tablet (5 mg total) by mouth every 6 (six) hours as needed for severe pain. 30 tablet 0   rosuvastatin (CRESTOR) 5 MG tablet Take 5 mg by mouth daily.     tiZANidine (ZANAFLEX) 4 MG tablet Take 4 mg by mouth in the morning and at bedtime.     YUVAFEM 10 MCG TABS vaginal tablet Place 1 tablet vaginally 2 (two) times a week.     alendronate (FOSAMAX) 70 MG tablet PLEASE SEE ATTACHED FOR DETAILED DIRECTIONS     escitalopram (LEXAPRO) 10 MG tablet Take 20 mg by mouth daily.     No facility-administered medications prior to visit.    PAST MEDICAL HISTORY: Past Medical History:  Diagnosis Date   Anemia    as a child    Anxiety    Asthma    Back pain    Chronic fatigue syndrome    Complication of anesthesia    slow to wake up age 38, but since cholecystectomy and no problems    Constipation    Depression    Diabetes ([place])    Family history of anesthesia complication    father slow to wake up    Fatty liver    Fibromyalgia    GERD (gastroesophageal reflux disease)    Gestational diabetes    Headache    High blood pressure    High cholesterol    Hypertension    IBS (irritable bowel syndrome)    Joint pain    Lactose intolerance    Osteopenia    SOB (shortness of breath)     PAST SURGICAL HISTORY: Past Surgical History:  Procedure Laterality Date   BREAST BIOPSY     BREAST EXCISIONAL BIOPSY     CESAREAN SECTION     x 3   CHOLECYSTECTOMY     THYROIDECTOMY N/A 09/19/2014   Procedure:  THYROIDECTOMY;  Surgeon: Armandina Gemma, MD;  Location: [facility];  Service: General;  Laterality: N/A;    FAMILY HISTORY: Family History  Problem Relation Age of Onset   Breast cancer Maternal Aunt    Allergic rhinitis Mother    Asthma Mother    High blood pressure Mother    High Cholesterol Mother    Depression Mother    Anxiety disorder Mother    Alcoholism Mother    Eczema Father    High blood  pressure Father    High Cholesterol Father    Heart disease Father    Depression Father    Liver disease Father    Alcoholism Father    AAA (abdominal aortic aneurysm) Father    Angioedema Maternal Grandfather    Urticaria Maternal Grandfather     SOCIAL HISTORY: Social History   Socioeconomic History   Marital status: Married    Spouse name: Engineer, technical sales   Number of children: Not on file   Years of education: Not on file   Highest education level: Not on file  Occupational History   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  Tobacco Use   Smoking status: Never   Smokeless tobacco: Never  Substance and Sexual Activity   Alcohol use: Yes    Comment: socially    Drug use: No   Sexual activity: Not on file  Other Topics Concern   Not on file  Social History Narrative   Not on file   Social Determinants of Health   Financial Resource Strain: Not on file  Food Insecurity: Not on file  Transportation Needs: Not on file  Physical Activity: Not on file  Stress: Not on file  Social Connections: Not on file  Intimate Partner Violence: Not on file   PHYSICAL EXAM  Vitals:   06/04/21 1331  BP: 128/75  Pulse: 80  Weight: 199 lb (90.3 kg)  Height: 5\' 7"  (1.702 m)   Body mass index is 31.17 kg/m.  Generalized: Well developed, in no acute distress, tearful  Neurological examination  Mentation: Alert oriented to time, place, history taking. Follows all commands speech and language fluent Cranial nerve II-XII: Pupils were equal round reactive to light. Extraocular  movements were full, visual field were full on confrontational test. Facial sensation and strength were normal. Head turning and shoulder shrug  were normal and symmetric. Motor: Good strength all extremities, does not give much effort due to reportedly pain Sensory: Sensory testing is intact to soft touch on all 4 extremities. No evidence of extinction is noted.  Coordination: Cerebellar testing reveals good finger-nose-finger and heel-to-shin bilaterally.  Gait and station: Gait is normal. Tandem gait is slightly unsteady. Reflexes: Deep tendon reflexes are symmetric and normal bilaterally.   DIAGNOSTIC DATA (LABS, IMAGING, TESTING) - I reviewed patient records, labs, notes, testing and imaging myself where available.  Lab Results  Component Value Date   WBC 8.1 01/30/2019   HGB 14.7 01/30/2019   HCT 43.2 01/30/2019   MCV 84 01/30/2019   PLT 363 01/30/2019      Component Value Date/Time   NA 142 07/05/2019 1048   K 4.7 07/05/2019 1048   CL 99 07/05/2019 1048   CO2 22 07/05/2019 1048   GLUCOSE 167 (H) 07/05/2019 1048   GLUCOSE 192 (H) 09/20/2014 0445   BUN 12 07/05/2019 1048   CREATININE 0.65 07/05/2019 1048   CALCIUM 10.1 07/05/2019 1048   PROT 7.2 07/05/2019 1048   ALBUMIN 5.1 (H) 07/05/2019 1048   AST 26 07/05/2019 1048   ALT 56 (H) 07/05/2019 1048   ALKPHOS 59 07/05/2019 1048   BILITOT 0.5 07/05/2019 1048   GFRNONAA 101 07/05/2019 1048   GFRAA 116 07/05/2019 1048   Lab Results  Component Value Date   CHOL 172 07/05/2019   HDL 42 07/05/2019   LDLCALC 105 (H) 07/05/2019   TRIG 124 07/05/2019   No results found for: HGBA1C No results found for: VITAMINB12 No results found for: TSH    ASSESSMENT AND  PLAN 55 y.o. year old female  has a past medical history of Anemia, Anxiety, Asthma, Back pain, Chronic fatigue syndrome, Complication of anesthesia, Constipation, Depression, Diabetes ([place]), Family history of anesthesia complication, Fatty liver, Fibromyalgia, GERD  (gastroesophageal reflux disease), Gestational diabetes, Headache, High blood pressure, High cholesterol, Hypertension, IBS (irritable bowel syndrome), Joint pain, Lactose intolerance, Osteopenia, and SOB (shortness of breath). here with:  1.  Fibromyalgia  -Appears to have severe symptoms from fibromyalgia -Has not seen benefit or tolerability for multiple medications -Will refer to [facility] for pain management -Will d/c oxycodone prescription, was not helpful, not getting any prescription medications from our office at this point -Continue routine follow-up with PCP, follow-up in our office on an as-needed basis  Evangeline Dakin, DNP 06/04/2021, 2:14 PM [facility] [address] [place], [place] [address] [phone]

## 2021-06-16 ENCOUNTER — Encounter: Payer: Self-pay | Admitting: Neurology

## 2021-07-11 ENCOUNTER — Other Ambulatory Visit: Payer: Self-pay | Admitting: Adult Health

## 2021-07-11 DIAGNOSIS — F331 Major depressive disorder, recurrent, moderate: Secondary | ICD-10-CM

## 2021-07-11 DIAGNOSIS — F411 Generalized anxiety disorder: Secondary | ICD-10-CM

## 2021-07-15 ENCOUNTER — Other Ambulatory Visit: Payer: Self-pay

## 2021-07-15 ENCOUNTER — Encounter: Payer: 59 | Attending: Physical Medicine and Rehabilitation | Admitting: Physical Medicine and Rehabilitation

## 2021-07-15 ENCOUNTER — Encounter: Payer: Self-pay | Admitting: Physical Medicine and Rehabilitation

## 2021-07-15 VITALS — BP 113/76 | HR 89 | Temp 98.3°F | Ht 67.0 in | Wt 195.0 lb

## 2021-07-15 DIAGNOSIS — M797 Fibromyalgia: Secondary | ICD-10-CM | POA: Diagnosis present

## 2021-07-15 DIAGNOSIS — K589 Irritable bowel syndrome without diarrhea: Secondary | ICD-10-CM

## 2021-07-15 DIAGNOSIS — Z5181 Encounter for therapeutic drug level monitoring: Secondary | ICD-10-CM | POA: Diagnosis present

## 2021-07-15 DIAGNOSIS — Z79891 Long term (current) use of opiate analgesic: Secondary | ICD-10-CM | POA: Diagnosis present

## 2021-07-15 DIAGNOSIS — G894 Chronic pain syndrome: Secondary | ICD-10-CM

## 2021-07-15 NOTE — Addendum Note (Signed)
Addended by: Sharia Reeve on: 07/15/2021 12:09 PM   Modules accepted: Orders

## 2021-07-15 NOTE — Patient Instructions (Addendum)
Foods that are god for pain: 1) Ginger (especially studied for arthritis)- reduce leukotriene production to decrease inflammation 2) Blueberries- high in phytonutrients that decrease inflammation 3) Salmon- marine omega-3s reduce joint swelling and pain 4) Pumpkin seeds- reduce inflammation 5) dark chocolate- reduces inflammation 6) turmeric- reduces inflammation 7) tart cherries - reduce pain and stiffness 8) extra virgin olive oil - its compound olecanthal helps to block prostaglandins  9) chili peppers- can be eaten or applied topically via capsaicin 10) mint- helpful for headache, muscle aches, joint pain, and itching 11) garlic- reduces inflammation  Link to further information on diet for chronic pain: [email or web address]   Constipation:   1) prunes- contain high amounts of fiber.  2) apples- has a form of dietary fiber called pectin that accelerates stool movement and increases beneficial gut bacteria 3) pears- in addition to fiber, also high in fructose and sorbitol which have laxative effect 4) figs- contain an enzyme ficin which helps to speed colonic transit 5) kiwis- contain an enzyme actinidin that improves gut motility and reduces constipation 6) oranges- rich in pectin (like apples) 7) grapefruits- contain a flavanol naringenin which has a laxative effect 8) vegetables- rich in fiber and also great sources of folate, vitamin C, and K 9) artichoke- high in inulin, prebiotic great for the microbiome 10) chicory- increases stool frequency and softness (can be added to coffee) 11) rhubarb- laxative effect 12) sweet potato- high fiber 13) beans, peas, and lentils- contain both soluble and insoluble fiber 14) chia seeds- improves intestinal health and gut flora 15) flaxseeds- laxative effect 16) whole grain rye bread- high in fiber 17) oat bran- high in soluble and insoluble fiber 18) kefir- softens  stools -recommended to try at least one of these foods every day.  -drink 6-8 glasses of water per day -walk regularly, especially after meals.

## 2021-07-15 NOTE — Progress Notes (Addendum)
Subjective:    Patient ID: Rhonda Bradley, female    DOB: 1966/08/26, 55 y.o.   MRN: [id number]  HPI Rhonda Bradley is a 55 year old woman who presents to establish care for fibromyalgia, chronic fatigue, Hashimoto's.  -she has had pain for many years, and it makes her miserable -she has had benefit from deep tissue massage. 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  -she loves to swim and this helps her  -heat, sometimes ice -she is prescribed diclofenac and oxycodone and she tries to take these sparingly.  -Has tried Savella, Lyrica, gabapentin, Cymbalta, Seroquel, amitriptyline without benefit or had side effect. -average pain is 8/10 -had episode of palpitations, and has felt at times like she is going to fall -she takes furosemide for lower extremity leg swelling.  -she used to be an exercise fanatic years ago and she does not know if this has contributed to a herniated discs.   Pain Inventory Average Pain 8 Pain Right Now 8 My pain is constant, sharp, burning, dull, stabbing, tingling, and aching  In the last 24 hours, has pain interfered with the following? General activity 9 Relation with others 9 Enjoyment of life 5 What TIME of day is your pain at its worst? morning , daytime, evening, and night Sleep (in general) Fair  Pain is worse with: some activites Pain improves with: heat/ice and medication Relief from Meds: 3  walk without assistance how many minutes can you walk? 10 mins ability to climb steps?  yes do you drive?  yes  not employed: date last employed 02/06/20 disabled: date disabled 02/06/20  weakness numbness tremor tingling trouble walking spasms dizziness confusion depression anxiety suicidal thoughts  N/a  N/a    Family History  Problem Relation Age of Onset   Breast cancer Maternal Aunt    Allergic rhinitis Mother    Asthma Mother    High blood pressure Mother    High Cholesterol Mother    Depression Mother     Anxiety disorder Mother    Alcoholism Mother    Eczema Father    High blood pressure Father    High Cholesterol Father    Heart disease Father    Depression Father    Liver disease Father    Alcoholism Father    AAA (abdominal aortic aneurysm) Father    Angioedema Maternal Grandfather    Urticaria Maternal Grandfather    Social History   Socioeconomic History   Marital status: Married    Spouse name: Engineer, technical sales   Number of children: Not on file   Years of education: Not on file   Highest education level: Not on file  Occupational History   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  Tobacco Use   Smoking status: Never   Smokeless tobacco: Never  Substance and Sexual Activity   Alcohol use: Yes    Comment: socially    Drug use: No   Sexual activity: Not on file  Other Topics Concern   Not on file  Social History Narrative   Not on file   Social Determinants of Health   Financial Resource Strain: Not on file  Food Insecurity: Not on file  Transportation Needs: Not on file  Physical Activity: Not on file  Stress: Not on file  Social Connections: Not on file   Past Surgical History:  Procedure Laterality Date   BREAST BIOPSY     BREAST EXCISIONAL [place]  x 3   CHOLECYSTECTOMY     THYROIDECTOMY N/A 09/19/2014   Procedure: THYROIDECTOMY;  Surgeon: Armandina Gemma, MD;  Location: [facility];  Service: General;  Laterality: N/A;   Past Medical History:  Diagnosis Date   Anemia    as a child    Anxiety    Asthma    Back pain    Chronic fatigue syndrome    Complication of anesthesia    slow to wake up age 9, but since cholecystectomy and no problems    Constipation    Depression    Diabetes ([place])    Family history of anesthesia complication    father slow to wake up    Fatty liver    Fibromyalgia    GERD (gastroesophageal reflux disease)    Gestational diabetes    Headache    High blood pressure    High cholesterol     Hypertension    IBS (irritable bowel syndrome)    Joint pain    Lactose intolerance    Osteopenia    SOB (shortness of breath)    BP 113/76 (BP Location: Right Arm)   Pulse 89   Temp 98.3 F (36.8 C) (Oral)   Ht '5\' 7"'$  (1.702 m)   Wt 195 lb (88.5 kg)   SpO2 95%   BMI 30.54 kg/m   Opioid Risk Score:   Fall Risk Score:  `1  Depression screen PHQ 2/9  Depression screen PHQ 2/9 07/05/2019  Decreased Interest 1  Down, Depressed, Hopeless 1  PHQ - 2 Score 2  Altered sleeping 0  Tired, decreased energy 1  Change in appetite 3  Feeling bad or failure about yourself  0  Trouble concentrating 3  Moving slowly or fidgety/restless 0  Suicidal thoughts 0  PHQ-9 Score 9  Difficult doing work/chores Extremely dIfficult       Review of Systems  Constitutional:  Positive for unexpected weight change.  HENT: Negative.    Eyes: Negative.   Respiratory:  Positive for cough and wheezing.   Cardiovascular: Negative.   Gastrointestinal:  Positive for constipation, diarrhea and nausea.  Endocrine: Negative.   Genitourinary: Negative.   Musculoskeletal:  Positive for back pain, gait problem and neck pain.       Pain in riht and left leg , pain in both arms,   Skin: Negative.   Allergic/Immunologic: Negative.   Neurological:  Positive for weakness, numbness and headaches.  Hematological: Negative.   Psychiatric/Behavioral:  Positive for dysphoric mood and suicidal ideas. The patient is nervous/anxious.       Objective:   Physical Exam Gen: no distress, normal appearing HEENT: oral mucosa pink and moist, NCAT Cardio: Reg rate Chest: normal effort, normal rate of breathing Abd: soft, non-distended Ext: no edema Psych: pleasant, normal affect Skin: intact Neuro: Alert and oriented x3 Musculoskeletal: Diffuse pain on both sides of her body and in multiple joints     Assessment & Plan:   1) Fibromyalgia: There is presence of widespread pain above and below the waist on both  sides for more than 3 months that is consistent with fibromyalgia. There are related symptoms of fatigue, cognitive dysfunction, unrefreshing sleep, anxiety, and depression. Educated that both genetics and environment can impact fibromyalgia. -reviewed her current medications -discussed the importance of exercise -UDS obtained and contains expected metabolites. Discussed risks and benefits of oxycodone and prescribed Percocet '5mg'$  BID PRN -continued massage, discussed ts other benefits.  -Discussed following foods that may reduce pain: 1) Ginger (  especially studied for arthritis)- reduce leukotriene production to decrease inflammation 2) Blueberries- high in phytonutrients that decrease inflammation 3) Salmon- marine omega-3s reduce joint swelling and pain 4) Pumpkin seeds- reduce inflammation 5) dark chocolate- reduces inflammation 6) turmeric- reduces inflammation 7) tart cherries - reduce pain and stiffness 8) extra virgin olive oil - its compound olecanthal helps to block prostaglandins  9) chili peppers- can be eaten or applied topically via capsaicin 10) mint- helpful for headache, muscle aches, joint pain, and itching 11) garlic- reduces inflammation  Link to further information on diet for chronic pain: [email or web address]   2) Stomach pain- has been diagnosed with IBS.  -advised against using the premier protein.   3) groin pain: -radiating from low back.   4) chronic cough -low dose prednisone helped for some time.

## 2021-07-20 ENCOUNTER — Telehealth: Payer: Self-pay | Admitting: *Deleted

## 2021-07-20 LAB — TOXASSURE SELECT,+ANTIDEPR,UR

## 2021-07-20 NOTE — Telephone Encounter (Signed)
Urine drug screen for this encounter is consistent for prescribed medication 

## 2021-07-21 ENCOUNTER — Telehealth: Payer: Self-pay | Admitting: *Deleted

## 2021-07-21 ENCOUNTER — Other Ambulatory Visit: Payer: Self-pay | Admitting: Physical Medicine and Rehabilitation

## 2021-07-21 MED ORDER — VITAMIN D (ERGOCALCIFEROL) 1.25 MG (50000 UNIT) PO CAPS: 50000.0000 [IU] | ORAL_CAPSULE | ORAL | 0 refills | Status: AC

## 2021-07-21 MED ORDER — OXYCODONE-ACETAMINOPHEN 5-325 MG PO TABS: 1.0000 | ORAL_TABLET | Freq: Two times a day (BID) | ORAL | 0 refills | Status: DC | PRN

## 2021-07-21 NOTE — Addendum Note (Signed)
Addended by: Izora Ribas on: 07/21/2021 05:11 PM   Modules accepted: Orders

## 2021-07-21 NOTE — Telephone Encounter (Signed)
Mrs Hansler called about getting her pain medication since UDS report is back. She also mentioned vit d.

## 2021-07-22 ENCOUNTER — Telehealth: Payer: Self-pay | Admitting: *Deleted

## 2021-07-22 NOTE — Telephone Encounter (Signed)
Prior aut for oxycodone acetaminophen 5/325 #60 initiated with [facility] via CoverMyMeds.  approved from 07/22/2021 to 01/18/2022. Pharmacy notified.

## 2021-07-27 ENCOUNTER — Other Ambulatory Visit: Payer: Self-pay | Admitting: Surgery

## 2021-07-27 DIAGNOSIS — R1013 Epigastric pain: Secondary | ICD-10-CM

## 2021-08-04 ENCOUNTER — Other Ambulatory Visit: Payer: 59

## 2021-08-07 ENCOUNTER — Ambulatory Visit
Admission: RE | Admit: 2021-08-07 | Discharge: 2021-08-07 | Disposition: A | Discharge status: Home / Self Care | Payer: 59 | Source: Ambulatory Visit | Attending: Surgery | Admitting: Surgery

## 2021-08-07 DIAGNOSIS — R1013 Epigastric pain: Secondary | ICD-10-CM

## 2021-08-07 MED ORDER — IOPAMIDOL (ISOVUE-300) INJECTION 61%
100.0000 mL | Freq: Once | INTRAVENOUS | Status: AC | PRN
  Administered 2021-08-07: 100 mL via INTRAVENOUS

## 2021-08-13 ENCOUNTER — Other Ambulatory Visit: Payer: Self-pay

## 2021-08-13 ENCOUNTER — Encounter: Payer: Self-pay | Admitting: Registered Nurse

## 2021-08-13 ENCOUNTER — Encounter: Payer: 59 | Attending: Registered Nurse | Admitting: Registered Nurse

## 2021-08-13 ENCOUNTER — Ambulatory Visit
Admission: RE | Admit: 2021-08-13 | Discharge: 2021-08-13 | Disposition: A | Discharge status: Home / Self Care | Payer: 59 | Source: Ambulatory Visit | Attending: Registered Nurse | Admitting: Registered Nurse

## 2021-08-13 VITALS — BP 121/82 | HR 86 | Temp 98.8°F | Ht 67.0 in | Wt 193.0 lb

## 2021-08-13 DIAGNOSIS — G8929 Other chronic pain: Secondary | ICD-10-CM

## 2021-08-13 DIAGNOSIS — Z79891 Long term (current) use of opiate analgesic: Secondary | ICD-10-CM

## 2021-08-13 DIAGNOSIS — M7061 Trochanteric bursitis, right hip: Secondary | ICD-10-CM | POA: Diagnosis present

## 2021-08-13 DIAGNOSIS — Z5181 Encounter for therapeutic drug level monitoring: Secondary | ICD-10-CM

## 2021-08-13 DIAGNOSIS — M25551 Pain in right hip: Secondary | ICD-10-CM

## 2021-08-13 DIAGNOSIS — G894 Chronic pain syndrome: Secondary | ICD-10-CM

## 2021-08-13 DIAGNOSIS — M545 Low back pain, unspecified: Secondary | ICD-10-CM | POA: Diagnosis present

## 2021-08-13 DIAGNOSIS — M797 Fibromyalgia: Secondary | ICD-10-CM | POA: Diagnosis present

## 2021-08-13 DIAGNOSIS — M542 Cervicalgia: Secondary | ICD-10-CM | POA: Diagnosis present

## 2021-08-13 NOTE — Progress Notes (Signed)
Subjective:    Patient ID: Rhonda Bradley, female    DOB: [birth year], 55 y.o.   MRN: [id number]  HPI: Rhonda Bradley is a 55 y.o. female who returns for follow up appointment for chronic pain and medication refill. She states her pain is located in her neck, lower back, and right hip pain. She also report she has right hip pain, denies falling. She rates her pain 7. Her current exercise regime is walking and performing stretching exercises.  Rhonda Bradley Morphine equivalent is 15.00 MME.   Last UDS was Performed on 07/15/2021, it was consistent.      Pain Inventory Average Pain 8 Pain Right Now 7 My pain is burning, dull, stabbing, tingling, and aching  In the last 24 hours, has pain interfered with the following? General activity 10 Relation with others 10 Enjoyment of life 6 What TIME of day is your pain at its worst? morning , daytime, evening, and night Sleep (in general) Fair  Pain is worse with: walking, bending, sitting, inactivity, standing, and some activites Pain improves with: rest, heat/ice, pacing activities, and medication Relief from Meds: 8  Family History  Problem Relation Age of Onset   Breast cancer Maternal Aunt    Allergic rhinitis Mother    Asthma Mother    High blood pressure Mother    High Cholesterol Mother    Depression Mother    Anxiety disorder Mother    Alcoholism Mother    Eczema Father    High blood pressure Father    High Cholesterol Father    Heart disease Father    Depression Father    Liver disease Father    Alcoholism Father    AAA (abdominal aortic aneurysm) Father    Angioedema Maternal Grandfather    Urticaria Maternal Grandfather    Social History   Socioeconomic History   Marital status: Married    Spouse name: Engineer, technical sales   Number of children: Not on file   Years of education: Not on file   Highest education level: Not on file  Occupational History   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   Tobacco Use   Smoking status: Never   Smokeless tobacco: Never  Substance and Sexual Activity   Alcohol use: Yes    Comment: socially    Drug use: No   Sexual activity: Not on file  Other Topics Concern   Not on file  Social History Narrative   Not on file   Social Determinants of Health   Financial Resource Strain: Not on file  Food Insecurity: Not on file  Transportation Needs: Not on file  Physical Activity: Not on file  Stress: Not on file  Social Connections: Not on file   Past Surgical History:  Procedure Laterality Date   BREAST BIOPSY     BREAST EXCISIONAL BIOPSY     CESAREAN SECTION     x 3   CHOLECYSTECTOMY     THYROIDECTOMY N/A 09/19/2014   Procedure: THYROIDECTOMY;  Surgeon: Armandina Gemma, MD;  Location: [facility];  Service: General;  Laterality: N/A;   Past Surgical History:  Procedure Laterality Date   BREAST BIOPSY     BREAST EXCISIONAL BIOPSY     CESAREAN SECTION     x 3   CHOLECYSTECTOMY     THYROIDECTOMY N/A 09/19/2014   Procedure: THYROIDECTOMY;  Surgeon: Armandina Gemma, MD;  Location: [facility];  Service: General;  Laterality: N/A;   Past Medical History:  Diagnosis Date  Anemia    as a child    Anxiety    Asthma    Back pain    Chronic fatigue syndrome    Complication of anesthesia    slow to wake up age 54, but since cholecystectomy and no problems    Constipation    Depression    Diabetes ([place])    Family history of anesthesia complication    father slow to wake up    Fatty liver    Fibromyalgia    GERD (gastroesophageal reflux disease)    Gestational diabetes    Headache    High blood pressure    High cholesterol    Hypertension    IBS (irritable bowel syndrome)    Joint pain    Lactose intolerance    Osteopenia    SOB (shortness of breath)    BP 121/82   Pulse 86   Temp 98.8 F (37.1 C)   Ht '5\' 7"'$  (1.702 m)   Wt 193 lb (87.5 kg)   SpO2 95%   BMI 30.23 kg/m   Opioid Risk Score:   Fall Risk Score:  `1  Depression screen  PHQ 2/9  Depression screen [facility] 2/9 07/15/2021 07/05/2019  Decreased Interest 2 1  Down, Depressed, Hopeless 1 1  PHQ - 2 Score 3 2  Altered sleeping 3 0  Tired, decreased energy 3 1  Change in appetite 3 3  Feeling bad or failure about yourself  2 0  Trouble concentrating 1 3  Moving slowly or fidgety/restless 3 0  Suicidal thoughts 0 0  PHQ-9 Score 18 9  Difficult doing work/chores Extremely dIfficult Extremely dIfficult    Review of Systems  Constitutional: Negative.   HENT: Negative.    Eyes:  Positive for photophobia and visual disturbance.  Respiratory: Negative.    Cardiovascular: Negative.   Gastrointestinal: Negative.   Endocrine: Negative.   Genitourinary: Negative.   Musculoskeletal:  Positive for arthralgias, back pain, myalgias, neck pain and neck stiffness.  Skin: Negative.   Allergic/Immunologic: Negative.   Neurological:  Positive for dizziness, light-headedness and headaches.       Tingling   Hematological: Negative.   Psychiatric/Behavioral: Negative.    All other systems reviewed and are negative.     Objective:   Physical Exam Vitals and nursing note reviewed.  Constitutional:      Appearance: Normal appearance.  Cardiovascular:     Rate and Rhythm: Normal rate and regular rhythm.     Pulses: Normal pulses.     Heart sounds: Normal heart sounds.  Pulmonary:     Effort: Pulmonary effort is normal.     Breath sounds: Normal breath sounds.  Musculoskeletal:     Cervical back: Normal range of motion and neck supple.     Comments: Normal Muscle Bulk and Muscle Testing Reveals:  Upper Extremities:Full  ROM and Muscle Strength 5/5  Lumbar Paraspinal Tenderness: L-3-L-5 Right Greater Trochanter Tenderness Lower Extremities: Full ROM and Muscle Strength 5/5 Arises from chair with ease Narrow Based  Gait     Skin:    General: Skin is warm and dry.  Neurological:     Mental Status: She is alert and oriented to person, place, and time.  Psychiatric:         Mood and Affect: Mood normal.        Behavior: Behavior normal.         Assessment & Plan:  Chronic Bilateral Low-Back Pain without Sciatica: Continue HEP as Tolerated. Continue  Current medication regimen. Continue to monitor.  Right Greater Trochanter Bursitis: Continue HEP as Tolerated and Continue to Monitor.  Acute Right Hip Pain: RX: X-ray  Chronic Pain Syndrome: Refilled: Oxycodone '5mg'$ /325 mg one tablet twice a day as needed for pain #60. We will continue the opioid monitoring program, this consists of regular clinic visits, examinations, urine drug screen, pill counts as well as use of [place] Controlled Substance Reporting system. A 12 month History has been reviewed on the [place] on 08/13/2021 F/U in 1 month

## 2021-08-20 ENCOUNTER — Other Ambulatory Visit: Payer: 59

## 2021-09-01 ENCOUNTER — Other Ambulatory Visit: Payer: Self-pay

## 2021-09-01 ENCOUNTER — Telehealth: Payer: Self-pay | Admitting: Adult Health

## 2021-09-01 DIAGNOSIS — G4700 Insomnia, unspecified: Secondary | ICD-10-CM

## 2021-09-01 MED ORDER — ESZOPICLONE 3 MG PO TABS
ORAL_TABLET | ORAL | 2 refills | Status: DC
Start: 2021-09-01 — End: 2021-10-27

## 2021-09-01 NOTE — Telephone Encounter (Signed)
Pended.

## 2021-09-01 NOTE — Telephone Encounter (Signed)
Pt called requesting Rx for Eszopiclone  3mg  for isomnia. [facility] [address]

## 2021-09-04 ENCOUNTER — Other Ambulatory Visit: Payer: Self-pay | Admitting: Physical Medicine and Rehabilitation

## 2021-09-04 MED ORDER — OXYCODONE-ACETAMINOPHEN 10-325 MG PO TABS: 1.0000 | ORAL_TABLET | Freq: Two times a day (BID) | ORAL | 0 refills | Status: DC | PRN

## 2021-09-17 ENCOUNTER — Encounter: Payer: Self-pay | Admitting: Registered Nurse

## 2021-09-17 ENCOUNTER — Other Ambulatory Visit: Payer: Self-pay

## 2021-09-17 ENCOUNTER — Encounter: Payer: 59 | Attending: Registered Nurse | Admitting: Registered Nurse

## 2021-09-17 VITALS — BP 121/80 | HR 78 | Temp 98.4°F | Ht 67.0 in | Wt 194.6 lb

## 2021-09-17 DIAGNOSIS — M79642 Pain in left hand: Secondary | ICD-10-CM | POA: Diagnosis present

## 2021-09-17 DIAGNOSIS — Z5181 Encounter for therapeutic drug level monitoring: Secondary | ICD-10-CM | POA: Diagnosis present

## 2021-09-17 DIAGNOSIS — M79641 Pain in right hand: Secondary | ICD-10-CM | POA: Insufficient documentation

## 2021-09-17 DIAGNOSIS — M797 Fibromyalgia: Secondary | ICD-10-CM

## 2021-09-17 DIAGNOSIS — M545 Low back pain, unspecified: Secondary | ICD-10-CM | POA: Diagnosis present

## 2021-09-17 DIAGNOSIS — M79672 Pain in left foot: Secondary | ICD-10-CM | POA: Diagnosis present

## 2021-09-17 DIAGNOSIS — M542 Cervicalgia: Secondary | ICD-10-CM

## 2021-09-17 DIAGNOSIS — G894 Chronic pain syndrome: Secondary | ICD-10-CM

## 2021-09-17 DIAGNOSIS — M79671 Pain in right foot: Secondary | ICD-10-CM | POA: Diagnosis present

## 2021-09-17 DIAGNOSIS — G8929 Other chronic pain: Secondary | ICD-10-CM | POA: Insufficient documentation

## 2021-09-17 DIAGNOSIS — Z79891 Long term (current) use of opiate analgesic: Secondary | ICD-10-CM

## 2021-09-17 MED ORDER — OXYCODONE-ACETAMINOPHEN 10-325 MG PO TABS: 1.0000 | ORAL_TABLET | Freq: Two times a day (BID) | ORAL | 0 refills | Status: DC | PRN

## 2021-09-17 NOTE — Progress Notes (Addendum)
Subjective:    Patient ID: Rhonda Bradley, female    DOB: 05-Jun-1966, 55 y.o.   MRN: [id number]  HPI: Rhonda Bradley is a 55 y.o. female who returns for follow up appointment for chronic pain and medication refill. She states  her pain is located in her bilateral hands, neck mainly right side, lower back pain, generalized pain and bilateral feet pain. She rates her pain 9. Her current exercise regime is walking and performing stretching exercises.  [name] Morphine equivalent is 30.00 MME.   Last UDS was Performed on 07/15/2021, see note for details.     Pain Inventory Average Pain 8 Pain Right Now 9 My pain is sharp, burning, dull, stabbing, tingling, and aching  In the last 24 hours, has pain interfered with the following? General activity 6 Relation with others 6 Enjoyment of life 4 What TIME of day is your pain at its worst? evening Sleep (in general) Fair  Pain is worse with: inactivity and some activites Pain improves with: heat/ice and medication Relief from Meds: 8  Family History  Problem Relation Age of Onset   Breast cancer Maternal Aunt    Allergic rhinitis Mother    Asthma Mother    High blood pressure Mother    High Cholesterol Mother    Depression Mother    Anxiety disorder Mother    Alcoholism Mother    Eczema Father    High blood pressure Father    High Cholesterol Father    Heart disease Father    Depression Father    Liver disease Father    Alcoholism Father    AAA (abdominal aortic aneurysm) Father    Angioedema Maternal Grandfather    Urticaria Maternal Grandfather    Social History   Socioeconomic History   Marital status: Married    Spouse name: Engineer, technical sales   Number of children: Not on file   Years of education: Not on file   Highest education level: Not on file  Occupational History   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  Tobacco Use   Smoking status: Never   Smokeless tobacco: Never  Vaping Use    Vaping Use: Never used  Substance and Sexual Activity   Alcohol use: Yes    Comment: socially    Drug use: No   Sexual activity: Not on file  Other Topics Concern   Not on file  Social History Narrative   Not on file   Social Determinants of Health   Financial Resource Strain: Not on file  Food Insecurity: Not on file  Transportation Needs: Not on file  Physical Activity: Not on file  Stress: Not on file  Social Connections: Not on file   Past Surgical History:  Procedure Laterality Date   BREAST BIOPSY     BREAST EXCISIONAL BIOPSY     CESAREAN SECTION     x 3   CHOLECYSTECTOMY     THYROIDECTOMY N/A 09/19/2014   Procedure: THYROIDECTOMY;  Surgeon: Armandina Gemma, MD;  Location: [facility];  Service: General;  Laterality: N/A;   Past Surgical History:  Procedure Laterality Date   BREAST BIOPSY     BREAST EXCISIONAL BIOPSY     CESAREAN SECTION     x 3   CHOLECYSTECTOMY     THYROIDECTOMY N/A 09/19/2014   Procedure: THYROIDECTOMY;  Surgeon: Armandina Gemma, MD;  Location: [facility];  Service: General;  Laterality: N/A;   Past Medical History:  Diagnosis Date   Anemia  as a child    Anxiety    Asthma    Back pain    Chronic fatigue syndrome    Complication of anesthesia    slow to wake up age 4, but since cholecystectomy and no problems    Constipation    Depression    Diabetes ([place])    Family history of anesthesia complication    father slow to wake up    Fatty liver    Fibromyalgia    GERD (gastroesophageal reflux disease)    Gestational diabetes    Headache    High blood pressure    High cholesterol    Hypertension    IBS (irritable bowel syndrome)    Joint pain    Lactose intolerance    Osteopenia    SOB (shortness of breath)    Temp 98.4 F (36.9 C)   Wt 194 lb 9.6 oz (88.3 kg)   BMI 30.48 kg/m   Opioid Risk Score:   Fall Risk Score:  `1  Depression screen PHQ 2/9  Depression screen [facility] 2/9 09/17/2021 07/15/2021 07/05/2019  Decreased Interest 1 2 1    Down, Depressed, Hopeless 3 1 1   PHQ - 2 Score 4 3 2   Altered sleeping - 3 0  Tired, decreased energy - 3 1  Change in appetite - 3 3  Feeling bad or failure about yourself  - 2 0  Trouble concentrating - 1 3  Moving slowly or fidgety/restless - 3 0  Suicidal thoughts - 0 0  PHQ-9 Score - 18 9  Difficult doing work/chores - Extremely dIfficult Extremely dIfficult     Review of Systems  Constitutional: Negative.   HENT: Negative.    Eyes: Negative.   Respiratory: Negative.    Cardiovascular: Negative.   Gastrointestinal:  Positive for abdominal pain.  Endocrine: Negative.   Genitourinary: Negative.   Musculoskeletal:  Positive for back pain, gait problem and neck pain.  Skin: Negative.   Allergic/Immunologic: Negative.   Neurological:  Positive for dizziness and weakness.  Hematological: Negative.   Psychiatric/Behavioral:  Positive for dysphoric mood.       Objective:   Physical Exam Vitals and nursing note reviewed.  Constitutional:      Appearance: Normal appearance.  Cardiovascular:     Rate and Rhythm: Normal rate and regular rhythm.     Pulses: Normal pulses.     Heart sounds: Normal heart sounds.  Pulmonary:     Effort: Pulmonary effort is normal.     Breath sounds: Normal breath sounds.  Musculoskeletal:     Cervical back: Normal range of motion and neck supple.     Comments: Normal Muscle Bulk and Muscle Testing Reveals:  Upper Extremities: Full ROM and Muscle Strength 5/5 Bilateral AC Joint Tenderness  Thoracic Paraspinal Tenderness: T-7-T-9 Lumbar Paraspinal Tenderness: L-3-L-5 Lower Extremities: Full ROM and Muscle Strength 5/5 Arises From table with ease Narrow Based  Gait     Skin:    General: Skin is warm and dry.  Neurological:     Mental Status: She is alert and oriented to person, place, and time.  Psychiatric:        Mood and Affect: Mood normal.        Behavior: Behavior normal.         Assessment & Plan:  Chronic Bilateral  Low-Back Pain without Sciatica: Continue HEP as Tolerated. Continue Current medication regimen. Continue to monitor. 09/17/2021 Right Greater Trochanter Bursitis:No complaints today.  Continue HEP as Tolerated and Continue  to Monitor. 09/17/2021 Bilateral Hand Pain: Continue HEP as Tolerated. Continue to monitor.  Chronic Pain Syndrome: ADDENDUM by Dr. Ranell Patrick: Zella Ball, due to her pain flares, it is ok to increase her oxycodone to 3 tabs per day prn. Refilled: Oxycodone 5mg /325 mg one tablet twice a day as needed for pain #60. We will continue the opioid monitoring program, this consists of regular clinic visits, examinations, urine drug screen, pill counts as well as use of [place] Controlled Substance Reporting system. A 12 month History has been reviewed on the [place] on 09/17/2021 Bilateral Feet Pain: Continue HEP as Tolerated. Continue current medication regimen. Continue to monitor.   F/U in 1 month

## 2021-09-17 NOTE — Patient Instructions (Signed)
Call or send a My- Chart Message to [facility]  on 11/ 21/2022 regarding refilling your medication.

## 2021-10-02 ENCOUNTER — Ambulatory Visit: Payer: 59 | Admitting: Physical Medicine and Rehabilitation

## 2021-10-16 ENCOUNTER — Encounter: Payer: 59 | Admitting: Registered Nurse

## 2021-10-21 ENCOUNTER — Ambulatory Visit: Payer: 59 | Admitting: Adult Health

## 2021-10-27 ENCOUNTER — Other Ambulatory Visit: Payer: Self-pay

## 2021-10-27 ENCOUNTER — Encounter: Payer: Self-pay | Admitting: Adult Health

## 2021-10-27 ENCOUNTER — Ambulatory Visit (INDEPENDENT_AMBULATORY_CARE_PROVIDER_SITE_OTHER): Payer: 59 | Admitting: Adult Health

## 2021-10-27 DIAGNOSIS — F331 Major depressive disorder, recurrent, moderate: Secondary | ICD-10-CM | POA: Diagnosis not present

## 2021-10-27 DIAGNOSIS — F411 Generalized anxiety disorder: Secondary | ICD-10-CM

## 2021-10-27 DIAGNOSIS — G4700 Insomnia, unspecified: Secondary | ICD-10-CM | POA: Diagnosis not present

## 2021-10-27 MED ORDER — ESZOPICLONE 3 MG PO TABS: ORAL_TABLET | ORAL | 2 refills | Status: DC

## 2021-10-27 MED ORDER — ESCITALOPRAM OXALATE 20 MG PO TABS
20.0000 mg | ORAL_TABLET | Freq: Every day | ORAL | 3 refills | Status: DC
Start: 2021-10-27 — End: 2022-10-27

## 2021-10-27 NOTE — Progress Notes (Signed)
Rhonda Bradley [id number] 05-Jul-1966 55 y.o.  Subjective:   Patient ID:  Rhonda Bradley is a 55 y.o. (DOB 1966-11-23) female.  Chief Complaint: No chief complaint on file.   HPI NERISSA CONSTANTIN presents to the office today for follow-up of GAD, MDD, and insomnia.  Describes mood today as "ok". Pleasant. Tearful at times - nothing out of the normal. Mood symptoms - report anxiety and irritability. Not feeling as depressed. Stating "I think I'm doing ok". Feels like medications are helpful. Recently lost a friend to cancer. Daughter had to terminate a pregnancy at 25 weeks. Suffers from chronic pain and fatigue from Fibromyalgia. Diagnosed with Fibromyalgia in 2000. Varying interest and motivation. Taking medications as prescribed.  Energy levels "lower". Active, does not have a regular exercise with physical limitations Enjoys some usual interests and activities. Married. Lives with husband of 8 years. Has 3 children - 2 boys and 1 girl - 2 grandchildren. Family local.   Appetite adequate. Weight loss - 11 pounds - 187 from 198 pounds. Sleeps better some nights than others. Averages 6 to 8 hours. Taking 2 to 3 hours to get to sleep. Decreased napping. Focus and concentration still a "struggle". Fibromyalgia - cognitive issues "brain fog". Completing tasks. Managing aspects of household. Unable to work. Has applied for disability benefits. Denies SI or HI.  Denies AH or VH.  Previous medication trials: Wellbutrin SR, Wellbutrin XL, Cymbalta, Gabapentin   PHQ2-9    Flowsheet Row Office Visit from 09/17/2021 in [facility] Office Visit from 07/15/2021 in [facility] Office Visit from 07/05/2019 in [facility]  PHQ-2 Total Score 4 3 2   PHQ-9 Total Score -- 18 9        Review of Systems:  Review of Systems  Musculoskeletal:  Negative for gait problem.  Neurological:  Negative for  tremors.  Psychiatric/Behavioral:         Please refer to HPI   Medications: I have reviewed the patient's current medications.  Current Outpatient Medications  Medication Sig Dispense Refill   Acetylcysteine (N-ACETYL-L-CYSTEINE PO) Take by mouth.     albuterol (PROVENTIL HFA;VENTOLIN HFA) 108 (90 Base) MCG/ACT inhaler Inhale 2 puffs into the lungs every 4 (four) hours as needed for wheezing or shortness of breath. 1 Inhaler 2   budesonide-formoterol (SYMBICORT) 160-4.5 MCG/ACT inhaler INHALE TWO PUFFS TWICE DAILY TO PREVENT COUGH OR WHEEZE. RINSE MOUTH AFTER USE. 10.2 Inhaler 0   diclofenac (VOLTAREN) 75 MG EC tablet Take 75 mg by mouth 2 (two) times daily.     escitalopram (LEXAPRO) 20 MG tablet Take 1 tablet (20 mg total) by mouth daily. 90 tablet 3   esomeprazole (NEXIUM) 40 MG capsule Take 40 mg by mouth 2 (two) times daily.     Eszopiclone 3 MG TABS TAKE ONE TABLET BY MOUTH DAILY AT BEDTIME FOR INSOMNIA 30 tablet 2   fluticasone (FLONASE) 50 MCG/ACT nasal spray Place 2 sprays into both nostrils 2 (two) times daily.     fluticasone (VERAMYST) 27.5 MCG/SPRAY nasal spray Place 2 sprays into the nose daily.     furosemide (LASIX) 40 MG tablet Take 80 mg by mouth daily. Patient takes as needed per patient     glimepiride (AMARYL) 2 MG tablet Take 2 mg by mouth daily with breakfast.     glucosamine-chondroitin 500-400 MG tablet Take 1 tablet by mouth 3 (three) times daily.     irbesartan (AVAPRO) 75 MG tablet  Take 37.5 mg by mouth daily.     levothyroxine (SYNTHROID, LEVOTHROID) 100 MCG tablet Take 100 mcg by mouth daily before breakfast.  2   liothyronine (CYTOMEL) 5 MCG tablet TAKE 1 TABLET BY MOUTH TWICE A DAY ON AN EMPTY STOMACH  5   metFORMIN (GLUCOPHAGE-XR) 500 MG 24 hr tablet Take 500 mg by mouth in the morning and at bedtime.  11   methylphenidate (CONCERTA) 36 MG PO CR tablet Take 1 tablet (36 mg total) by mouth daily. (Patient not taking: No sig reported) 30 tablet 0    methylphenidate (CONCERTA) 54 MG PO CR tablet Take 1 tablet (54 mg total) by mouth daily. (Patient not taking: No sig reported) 30 tablet 0   montelukast (SINGULAIR) 10 MG tablet Take 1 tablet (10 mg total) by mouth at bedtime. 30 tablet 5   oxyCODONE-acetaminophen (PERCOCET) 10-325 MG tablet Take 1 tablet by mouth 2 (two) times daily as needed for pain. Do Not Fill Before 10/01/2021 60 tablet 0   rosuvastatin (CRESTOR) 5 MG tablet Take 5 mg by mouth daily.     Semaglutide (RYBELSUS) 7 MG TABS Take by mouth.     tiZANidine (ZANAFLEX) 4 MG tablet Take 4 mg by mouth in the morning and at bedtime.     Vitamin D, Ergocalciferol, (DRISDOL) 1.25 MG (50000 UNIT) CAPS capsule Take 1 capsule (50,000 Units total) by mouth every 7 (seven) days. (Patient not taking: Reported on 09/17/2021) 7 capsule 0   YUVAFEM 10 MCG TABS vaginal tablet Place 1 tablet vaginally 2 (two) times a week.     zinc gluconate 50 MG tablet Take 50 mg by mouth daily. (Patient not taking: Reported on 09/17/2021)     No current facility-administered medications for this visit.    Medication Side Effects: None  Allergies:  Allergies  Allergen Reactions   5ht3 Receptor Antagonists     Other reaction(s): Unknown   Atorvastatin     Other reaction(s): muscle pain   Erythromycin Nausea And Vomiting    Other reaction(s): Upset GI   Prednisone    Sulfamethoxazole-Trimethoprim     Other reaction(s): rash   Flagyl [Metronidazole] Rash   Other Rash   Penicillins Rash    Past Medical History:  Diagnosis Date   Anemia    as a child    Anxiety    Asthma    Back pain    Chronic fatigue syndrome    Complication of anesthesia    slow to wake up age 78, but since cholecystectomy and no problems    Constipation    Depression    Diabetes ([place])    Family history of anesthesia complication    father slow to wake up    Fatty liver    Fibromyalgia    GERD (gastroesophageal reflux disease)    Gestational diabetes    Headache     High blood pressure    High cholesterol    Hypertension    IBS (irritable bowel syndrome)    Joint pain    Lactose intolerance    Osteopenia    SOB (shortness of breath)     Past Medical History, Surgical history, Social history, and Family history were reviewed and updated as appropriate.   Please see review of systems for further details on the patient's review from today.   Objective:   Physical Exam:  There were no vitals taken for this visit.  Physical Exam Constitutional:      General: She is not in  acute distress. Musculoskeletal:        General: No deformity.  Neurological:     Mental Status: She is alert and oriented to person, place, and time.     Coordination: Coordination normal.  Psychiatric:        Attention and Perception: Attention and perception normal. She does not perceive auditory or visual hallucinations.        Mood and Affect: Mood normal. Mood is not anxious or depressed. Affect is not labile, blunt, angry or inappropriate.        Speech: Speech normal.        Behavior: Behavior normal.        Thought Content: Thought content normal. Thought content is not paranoid or delusional. Thought content does not include homicidal or suicidal ideation. Thought content does not include homicidal or suicidal plan.        Cognition and Memory: Cognition and memory normal.        Judgment: Judgment normal.     Comments: Insight intact    Lab Review:     Component Value Date/Time   NA 142 07/05/2019 1048   K 4.7 07/05/2019 1048   CL 99 07/05/2019 1048   CO2 22 07/05/2019 1048   GLUCOSE 167 (H) 07/05/2019 1048   GLUCOSE 192 (H) 09/20/2014 0445   BUN 12 07/05/2019 1048   CREATININE 0.65 07/05/2019 1048   CALCIUM 10.1 07/05/2019 1048   PROT 7.2 07/05/2019 1048   ALBUMIN 5.1 (H) 07/05/2019 1048   AST 26 07/05/2019 1048   ALT 56 (H) 07/05/2019 1048   ALKPHOS 59 07/05/2019 1048   BILITOT 0.5 07/05/2019 1048   GFRNONAA 101 07/05/2019 1048   GFRAA 116  07/05/2019 1048       Component Value Date/Time   WBC 8.1 01/30/2019 1808   WBC 9.2 09/12/2014 1515   RBC 5.14 01/30/2019 1808   RBC 4.88 09/12/2014 1515   HGB 14.7 01/30/2019 1808   HCT 43.2 01/30/2019 1808   PLT 363 01/30/2019 1808   MCV 84 01/30/2019 1808   MCH 28.6 01/30/2019 1808   MCH 29.9 09/12/2014 1515   MCHC 34.0 01/30/2019 1808   MCHC 33.7 09/12/2014 1515   RDW 13.3 01/30/2019 1808   LYMPHSABS 3.5 (H) 01/30/2019 1808   MONOABS 0.5 09/05/2010 2335   EOSABS 0.1 01/30/2019 1808   BASOSABS 0.1 01/30/2019 1808    No results found for: POCLITH, LITHIUM   No results found for: PHENYTOIN, PHENOBARB, VALPROATE, CBMZ   .res Assessment: Plan:    Plan:  PDMP reviewed  1. Lunesta 3mg  at bedtime for sleep 2. Lexapro 20mg  daily  RTC 6 months  Patient advised to contact office with any questions, adverse effects, or acute worsening in signs and symptoms.  Discussed potential metabolic side effects associated with atypical antipsychotics, as well as potential risk for movement side effects. Advised pt to contact office if movement side effects occur.   Discussed potential benefits, risks, and side effects of stimulants with patient to include increased heart rate, palpitations, insomnia, increased anxiety, increased irritability, or decreased appetite.  Instructed patient to contact office if experiencing any significant tolerability issues.  Diagnoses and all orders for this visit:  Insomnia, unspecified type -     Eszopiclone 3 MG TABS; TAKE ONE TABLET BY MOUTH DAILY AT BEDTIME FOR INSOMNIA  Major depressive disorder, recurrent episode, moderate (HCC) -     escitalopram (LEXAPRO) 20 MG tablet; Take 1 tablet (20 mg total) by mouth daily.  Generalized  anxiety disorder -     escitalopram (LEXAPRO) 20 MG tablet; Take 1 tablet (20 mg total) by mouth daily.    Please see After Visit Summary for patient specific instructions.  Future Appointments  Date Time Provider  [place]  11/12/2021 11:20 AM Raulkar, Clide Deutscher, MD [facility]  04/26/2022  2:40 PM , Berdie Ogren, NP [facility] None    No orders of the defined types were placed in this encounter.   -------------------------------

## 2021-11-10 NOTE — Progress Notes (Signed)
Subjective:    Patient ID: Rhonda Bradley, female    DOB: Jun 19, 1966, 55 y.o.   MRN: [id number]  HPI Rhonda Bradley is a 55 year old woman who presents for follow-up of fibromyalgia, chronic fatigue, Hashimoto's.  -she has had pain for many years, and it makes her miserable -she has had benefit from deep tissue massage. 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  -she loves to swim and this helps her  -heat, sometimes ice -she is prescribed diclofenac and oxycodone and she tries to take these sparingly.  -Has tried Savella, Lyrica, gabapentin, Cymbalta, Seroquel, amitriptyline without benefit or had side effect. -average pain is 8/10 -had episode of palpitations, and has felt at times like she is going to fall -she takes furosemide for lower extremity leg swelling.  -she used to be an exercise fanatic years ago and she does not know if this has contributed to a herniated discs. -pain worsens with the cold weather -feeling more pan today with the gloomy weather -last time she was here she mentioned to [facility] about knots in her neck.  -she gets myofascial pain in her neck and upper back, her legs, her shoulder blades bilaterally.  -worst on her left side.  -husband afraid of hurting her.  -she sometimes gets tightness to wear she can't move her neck.    Pain Inventory Average Pain 8 Pain Right Now 8 My pain is constant, sharp, burning, stabbing, tingling, and aching  In the last 24 hours, has pain interfered with the following? General activity 6 Relation with others 10 Enjoyment of life 5 What TIME of day is your pain at its worst? morning , daytime, evening, and night Sleep (in general) Fair  Pain is worse with: walking, bending, sitting, inactivity, standing, and some activites Pain improves with: rest, heat/ice, pacing activities, and medication Relief from Meds: 8     Family History  Problem Relation Age of Onset   Breast cancer Maternal Aunt    Allergic  rhinitis Mother    Asthma Mother    High blood pressure Mother    High Cholesterol Mother    Depression Mother    Anxiety disorder Mother    Alcoholism Mother    Eczema Father    High blood pressure Father    High Cholesterol Father    Heart disease Father    Depression Father    Liver disease Father    Alcoholism Father    AAA (abdominal aortic aneurysm) Father    Angioedema Maternal Grandfather    Urticaria Maternal Grandfather    Social History   Socioeconomic History   Marital status: Married    Spouse name: Engineer, technical sales   Number of children: Not on file   Years of education: Not on file   Highest education level: Not on file  Occupational History   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  Tobacco Use   Smoking status: Never   Smokeless tobacco: Never  Vaping Use   Vaping Use: Never used  Substance and Sexual Activity   Alcohol use: Yes    Comment: socially    Drug use: No   Sexual activity: Not on file  Other Topics Concern   Not on file  Social History Narrative   Not on file   Social Determinants of Health   Financial Resource Strain: Not on file  Food Insecurity: Not on file  Transportation Needs: Not on file  Physical Activity: Not on file  Stress:  Not on file  Social Connections: Not on file   Past Surgical History:  Procedure Laterality Date   BREAST BIOPSY     BREAST EXCISIONAL BIOPSY     CESAREAN SECTION     x 3   CHOLECYSTECTOMY     THYROIDECTOMY N/A 09/19/2014   Procedure: THYROIDECTOMY;  Surgeon: Armandina Gemma, MD;  Location: [facility];  Service: General;  Laterality: N/A;   Past Medical History:  Diagnosis Date   Anemia    as a child    Anxiety    Asthma    Back pain    Chronic fatigue syndrome    Complication of anesthesia    slow to wake up age 55, but since cholecystectomy and no problems    Constipation    Depression    Diabetes ([place])    Family history of anesthesia complication    father slow to wake up    Fatty  liver    Fibromyalgia    GERD (gastroesophageal reflux disease)    Gestational diabetes    Headache    High blood pressure    High cholesterol    Hypertension    IBS (irritable bowel syndrome)    Joint pain    Lactose intolerance    Osteopenia    SOB (shortness of breath)    There were no vitals taken for this visit.  Opioid Risk Score:   Fall Risk Score:  `1  Depression screen PHQ 2/9  Depression screen [facility] 2/9 09/17/2021 07/15/2021 07/05/2019  Decreased Interest 1 2 1   Down, Depressed, Hopeless 3 1 1   PHQ - 2 Score 4 3 2   Altered sleeping - 3 0  Tired, decreased energy - 3 1  Change in appetite - 3 3  Feeling bad or failure about yourself  - 2 0  Trouble concentrating - 1 3  Moving slowly or fidgety/restless - 3 0  Suicidal thoughts - 0 0  PHQ-9 Score - 18 9  Difficult doing work/chores - Extremely dIfficult Extremely dIfficult       Review of Systems  Constitutional:  Negative for unexpected weight change.  HENT: Negative.    Eyes: Negative.   Respiratory:  Negative for cough and wheezing.   Cardiovascular: Negative.   Gastrointestinal:  Negative for constipation, diarrhea and nausea.  Endocrine: Negative.   Genitourinary: Negative.   Musculoskeletal:  Positive for back pain, gait problem and neck pain.       Pain in riht and left leg , pain in both arms,   Skin: Negative.   Allergic/Immunologic: Negative.   Neurological:  Positive for weakness and headaches. Negative for numbness.  Hematological: Negative.   Psychiatric/Behavioral:  Positive for dysphoric mood. Negative for suicidal ideas. The patient is not nervous/anxious.        Anxiety      Objective:   Physical Exam Gen: no distress, normal appearing HEENT: oral mucosa pink and moist, NCAT Cardio: Reg rate Chest: normal effort, normal rate of breathing Abd: soft, non-distended Ext: no edema Psych: pleasant, normal affect Skin: intact Neuro: Alert and oriented x3 Musculoskeletal: Diffuse pain  on both sides of her body and in multiple joints. Tight cervical myofascia.      Assessment & Plan:   1) Fibromyalgia: There is presence of widespread pain above and below the waist on both sides for more than 3 months that is consistent with fibromyalgia. There are related symptoms of fatigue, cognitive dysfunction, unrefreshing sleep, anxiety, and depression. Educated that both genetics and  environment can impact fibromyalgia. -reviewed her current medications -discussed the importance of exercise -UDS obtained and contains expected metabolites. Discussed risks and benefits of oxycodone and prescribed Percocet 5mg  BID PRN. Repeat UDS today.  -discussed side effects of NSAIDs.  -discussed side effects of oxycodone, increase to TID PRN.  -Bacopa recommended.  -recommended taking NAC 600mg  BID.  -continued massage, discussed ts other benefits.  -Discussed following foods that may reduce pain: 1) Ginger (especially studied for arthritis)- reduce leukotriene production to decrease inflammation 2) Blueberries- high in phytonutrients that decrease inflammation 3) Salmon- marine omega-3s reduce joint swelling and pain 4) Pumpkin seeds- reduce inflammation 5) dark chocolate- reduces inflammation 6) turmeric- reduces inflammation 7) tart cherries - reduce pain and stiffness 8) extra virgin olive oil - its compound olecanthal helps to block prostaglandins  9) chili peppers- can be eaten or applied topically via capsaicin 10) mint- helpful for headache, muscle aches, joint pain, and itching 11) garlic- reduces inflammation  Link to further information on diet for chronic pain: [email or web address]   2) Stomach pain- has been diagnosed with IBS.  -advised against using the premier protein.   3) groin pain: -radiating from low back.   4) chronic cough -low dose prednisone helped for some time.   5) Cervical myofascial  pain syndrome -referred for therapy for dry needling,  -trigger point injections.

## 2021-11-12 ENCOUNTER — Encounter: Payer: Self-pay | Admitting: Physical Medicine and Rehabilitation

## 2021-11-12 ENCOUNTER — Encounter: Payer: 59 | Attending: Registered Nurse | Admitting: Physical Medicine and Rehabilitation

## 2021-11-12 ENCOUNTER — Other Ambulatory Visit: Payer: Self-pay

## 2021-11-12 VITALS — Ht 67.0 in | Wt 190.0 lb

## 2021-11-12 DIAGNOSIS — M7918 Myalgia, other site: Secondary | ICD-10-CM | POA: Insufficient documentation

## 2021-11-12 DIAGNOSIS — Z5181 Encounter for therapeutic drug level monitoring: Secondary | ICD-10-CM | POA: Insufficient documentation

## 2021-11-12 DIAGNOSIS — G894 Chronic pain syndrome: Secondary | ICD-10-CM | POA: Diagnosis not present

## 2021-11-12 DIAGNOSIS — Z79891 Long term (current) use of opiate analgesic: Secondary | ICD-10-CM | POA: Diagnosis not present

## 2021-11-12 MED ORDER — OXYCODONE-ACETAMINOPHEN 10-325 MG PO TABS: 1.0000 | ORAL_TABLET | Freq: Three times a day (TID) | ORAL | 0 refills | Status: DC | PRN

## 2021-11-12 NOTE — Patient Instructions (Addendum)
Bacopa  Wobenzymes  1) Ginger (especially studied for arthritis)- reduce leukotriene production to decrease inflammation 2) Blueberries- high in phytonutrients that decrease inflammation 3) Salmon- marine omega-3s reduce joint swelling and pain 4) Pumpkin seeds- reduce inflammation 5) dark chocolate- reduces inflammation 6) turmeric- reduces inflammation 7) tart cherries - reduce pain and stiffness 8) extra virgin olive oil - its compound olecanthal helps to block prostaglandins  9) chili peppers- can be eaten or applied topically via capsaicin 10) mint- helpful for headache, muscle aches, joint pain, and itching 11) garlic- reduces inflammation  Link to further information on diet for chronic pain: [email or web address]   Fibromyalgia: There is presence of widespread pain above and below the waist on both sides for more than 3 months that is consistent with fibromyalgia. There are related symptoms of fatigue, cognitive dysfunction, unrefreshing sleep, anxiety, and depression. Educated that both genetics and environment can impact fibromyalgia.   -30 minutes of aerobic exercise 5 times per week and resistance exercise of all major muscle groups at least twice per week. Educated patient that this is a crucial component of recovery and that self-responsibility is key.   -Discussed positive reframing of thoughts: Instead of "This pain will never end," think "this pain is bad, but I can still do ____."  -Consider 5-HTP 100mg  three times per day for 30-90 days. 5-HTP is a produced from the essential amino acid tryptophan and gets converted to serotonin.    -Magnesium is a chemical element that is important to normal bone structure and plays a role in more than 300 cellular reactions. 300mg  daily magnesium for 8 weeks has been shown to improve tender points and depression symptoms, but not to improve pain.   -Super Malic  which contains 200mg  of malic acid and 50mg  of magnesium given three times daily for 4 weeks followed by 6 tablets twice daily for 6 months showed significant reductions in pain and tenderness.   -melatonin can decrease pain, stiffness, and the number of painful points  -S-adenosylmethionine is a molecule that is naturally formed in the body from homocysteine and 5-methyls tetrahydrofolate. Has been shown to reduce the number of trigger points, depressive symtoms, fatigue, morning stiffness. Can be administered in 400mg  BID for 6 weeks.   -Coenzyme Q10 is a fat-soluble compound. 300mg  daily for 40 days has been shown to reduce pain by 52-56%, morning tiredness by 56%, fatigue by 47%, and tender points by 44%.  -BioQuinon Q10 with 200mg  Gingko daily for 12 weeks improved physical fitness, social activities, and overall pain.  -Flexofytol is used in prescription in [place] for a variety of painful conditions. Administration4 times daily for 4-6 weeks has shown benefits in pain, fatigue, dizziness, heart palpitations, and cramps.

## 2021-11-18 ENCOUNTER — Encounter: Payer: Self-pay | Admitting: Physical Medicine and Rehabilitation

## 2021-11-19 ENCOUNTER — Encounter: Payer: Self-pay | Admitting: Physical Medicine and Rehabilitation

## 2021-11-19 ENCOUNTER — Telehealth: Payer: Self-pay | Admitting: *Deleted

## 2021-11-19 LAB — TOXASSURE SELECT,+ANTIDEPR,UR

## 2021-11-19 NOTE — Telephone Encounter (Signed)
Urine drug screen for this encounter is consistent for prescribed medication 

## 2021-12-04 ENCOUNTER — Other Ambulatory Visit: Payer: Self-pay | Admitting: Geriatric Medicine

## 2021-12-04 DIAGNOSIS — R1084 Generalized abdominal pain: Secondary | ICD-10-CM

## 2021-12-10 ENCOUNTER — Encounter: Payer: 59 | Attending: Registered Nurse | Admitting: Physical Medicine and Rehabilitation

## 2021-12-10 ENCOUNTER — Other Ambulatory Visit: Payer: Self-pay

## 2021-12-10 ENCOUNTER — Encounter: Payer: Self-pay | Admitting: Physical Medicine and Rehabilitation

## 2021-12-10 VITALS — BP 119/75 | HR 82 | Temp 99.6°F | Ht 67.0 in | Wt 187.0 lb

## 2021-12-10 DIAGNOSIS — M797 Fibromyalgia: Secondary | ICD-10-CM | POA: Diagnosis not present

## 2021-12-10 DIAGNOSIS — M7918 Myalgia, other site: Secondary | ICD-10-CM | POA: Diagnosis present

## 2021-12-10 NOTE — Progress Notes (Signed)

## 2021-12-11 ENCOUNTER — Ambulatory Visit: Payer: 59 | Admitting: Registered Nurse

## 2021-12-29 ENCOUNTER — Other Ambulatory Visit: Payer: 59

## 2022-01-08 ENCOUNTER — Other Ambulatory Visit: Payer: Self-pay

## 2022-01-08 ENCOUNTER — Encounter: Payer: 59 | Attending: Registered Nurse | Admitting: Registered Nurse

## 2022-01-08 ENCOUNTER — Encounter: Payer: Self-pay | Admitting: Registered Nurse

## 2022-01-08 VITALS — BP 107/74 | HR 100 | Temp 98.8°F | Ht 67.0 in | Wt 185.0 lb

## 2022-01-08 DIAGNOSIS — M79641 Pain in right hand: Secondary | ICD-10-CM | POA: Diagnosis not present

## 2022-01-08 DIAGNOSIS — M542 Cervicalgia: Secondary | ICD-10-CM | POA: Insufficient documentation

## 2022-01-08 DIAGNOSIS — G894 Chronic pain syndrome: Secondary | ICD-10-CM | POA: Insufficient documentation

## 2022-01-08 DIAGNOSIS — M79671 Pain in right foot: Secondary | ICD-10-CM | POA: Diagnosis not present

## 2022-01-08 DIAGNOSIS — M25511 Pain in right shoulder: Secondary | ICD-10-CM | POA: Insufficient documentation

## 2022-01-08 DIAGNOSIS — Z760 Encounter for issue of repeat prescription: Secondary | ICD-10-CM | POA: Insufficient documentation

## 2022-01-08 DIAGNOSIS — M79672 Pain in left foot: Secondary | ICD-10-CM | POA: Insufficient documentation

## 2022-01-08 DIAGNOSIS — M25512 Pain in left shoulder: Secondary | ICD-10-CM | POA: Insufficient documentation

## 2022-01-08 DIAGNOSIS — R202 Paresthesia of skin: Secondary | ICD-10-CM | POA: Insufficient documentation

## 2022-01-08 DIAGNOSIS — G8929 Other chronic pain: Secondary | ICD-10-CM

## 2022-01-08 DIAGNOSIS — M545 Low back pain, unspecified: Secondary | ICD-10-CM | POA: Diagnosis not present

## 2022-01-08 DIAGNOSIS — Z5181 Encounter for therapeutic drug level monitoring: Secondary | ICD-10-CM | POA: Diagnosis not present

## 2022-01-08 DIAGNOSIS — M797 Fibromyalgia: Secondary | ICD-10-CM | POA: Diagnosis not present

## 2022-01-08 DIAGNOSIS — Y92009 Unspecified place in unspecified non-institutional (private) residence as the place of occurrence of the external cause: Secondary | ICD-10-CM | POA: Diagnosis not present

## 2022-01-08 DIAGNOSIS — M7061 Trochanteric bursitis, right hip: Secondary | ICD-10-CM | POA: Diagnosis not present

## 2022-01-08 DIAGNOSIS — Z79891 Long term (current) use of opiate analgesic: Secondary | ICD-10-CM | POA: Diagnosis not present

## 2022-01-08 DIAGNOSIS — W19XXXD Unspecified fall, subsequent encounter: Secondary | ICD-10-CM | POA: Insufficient documentation

## 2022-01-08 DIAGNOSIS — M7918 Myalgia, other site: Secondary | ICD-10-CM | POA: Diagnosis not present

## 2022-01-08 DIAGNOSIS — M79642 Pain in left hand: Secondary | ICD-10-CM | POA: Insufficient documentation

## 2022-01-08 MED ORDER — OXYCODONE-ACETAMINOPHEN 10-325 MG PO TABS: 1.0000 | ORAL_TABLET | Freq: Three times a day (TID) | ORAL | 0 refills | Status: AC | PRN

## 2022-01-08 NOTE — Progress Notes (Signed)
Subjective:    Patient ID: DAJANEE VOORHEIS, female    DOB: 1966-02-11, 56 y.o.   MRN: [id number]  HPI: Rhonda Bradley is a 56 y.o. female who returns for follow up appointment for chronic pain and medication refill. She states her  pain is located in her neck, bilateral shoulder pain, lower back pain and generalized joint pain, she describes her pain as deep pain. She  rates her pain 8. Her current exercise regime is walking and performing stretching exercises.  Ms. Lanter reports last Saturday she was picking up her grandson and fell on her knees, she states her husband helped her up. She didn't seek medical attention. She was educated on fall prevention, she verbalizes understanding. She was instructed to pick up her grandson, while she is seated, she verbalizes understanding.     Pain Inventory Average Pain 8 Pain Right Now 8 My pain is sharp, burning, stabbing, tingling, and aching  In the last 24 hours, has pain interfered with the following? General activity 10 Relation with others 10 Enjoyment of life 8 What TIME of day is your pain at its worst? morning , daytime, and night Sleep (in general) Fair  Pain is worse with: walking, bending, sitting, inactivity, and standing Pain improves with: rest, heat/ice, therapy/exercise, and injections Relief from Meds: 9  Family History  Problem Relation Age of Onset   Breast cancer Maternal Aunt    Allergic rhinitis Mother    Asthma Mother    High blood pressure Mother    High Cholesterol Mother    Depression Mother    Anxiety disorder Mother    Alcoholism Mother    Eczema Father    High blood pressure Father    High Cholesterol Father    Heart disease Father    Depression Father    Liver disease Father    Alcoholism Father    AAA (abdominal aortic aneurysm) Father    Angioedema Maternal Grandfather    Urticaria Maternal Grandfather    Social History   Socioeconomic History   Marital status: Married    Spouse  name: Engineer, technical sales   Number of children: Not on file   Years of education: Not on file   Highest education level: Not on file  Occupational History   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  Tobacco Use   Smoking status: Never   Smokeless tobacco: Never  Vaping Use   Vaping Use: Never used  Substance and Sexual Activity   Alcohol use: Yes    Comment: socially    Drug use: No   Sexual activity: Not on file  Other Topics Concern   Not on file  Social History Narrative   Not on file   Social Determinants of Health   Financial Resource Strain: Not on file  Food Insecurity: Not on file  Transportation Needs: Not on file  Physical Activity: Not on file  Stress: Not on file  Social Connections: Not on file   Past Surgical History:  Procedure Laterality Date   BREAST BIOPSY     BREAST EXCISIONAL BIOPSY     CESAREAN SECTION     x 3   CHOLECYSTECTOMY     THYROIDECTOMY N/A 09/19/2014   Procedure: THYROIDECTOMY;  Surgeon: Armandina Gemma, MD;  Location: [facility];  Service: General;  Laterality: N/A;   Past Surgical History:  Procedure Laterality Date   BREAST BIOPSY     BREAST EXCISIONAL BIOPSY     CESAREAN SECTION  x 3   CHOLECYSTECTOMY     THYROIDECTOMY N/A 09/19/2014   Procedure: THYROIDECTOMY;  Surgeon: Armandina Gemma, MD;  Location: [facility];  Service: General;  Laterality: N/A;   Past Medical History:  Diagnosis Date   Anemia    as a child    Anxiety    Asthma    Back pain    Chronic fatigue syndrome    Complication of anesthesia    slow to wake up age 73, but since cholecystectomy and no problems    Constipation    Depression    Diabetes ([place])    Family history of anesthesia complication    father slow to wake up    Fatty liver    Fibromyalgia    GERD (gastroesophageal reflux disease)    Gestational diabetes    Headache    High blood pressure    High cholesterol    Hypertension    IBS (irritable bowel syndrome)    Joint pain    Lactose  intolerance    Osteopenia    SOB (shortness of breath)    BP 107/74    Pulse 100    Temp 98.8 F (37.1 C)    Ht 5\' 7"  (1.702 m)    Wt 185 lb (83.9 kg)    SpO2 96%    BMI 28.98 kg/m   Opioid Risk Score:   Fall Risk Score:  `1  Depression screen PHQ 2/9  Depression screen [facility] 2/9 01/08/2022 01/08/2022 12/10/2021 11/12/2021 09/17/2021 07/15/2021 07/05/2019  Decreased Interest 1 0 1 0 1 2 1   Down, Depressed, Hopeless 1 0 1 0 3 1 1   PHQ - 2 Score 2 0 2 0 4 3 2   Altered sleeping - - - - - 3 0  Tired, decreased energy - - - - - 3 1  Change in appetite - - - - - 3 3  Feeling bad or failure about yourself  - - - - - 2 0  Trouble concentrating - - - - - 1 3  Moving slowly or fidgety/restless - - - - - 3 0  Suicidal thoughts - - - - - 0 0  PHQ-9 Score - - - - - 18 9  Difficult doing work/chores - - - - - Extremely dIfficult Extremely dIfficult     Review of Systems  Constitutional: Negative.   HENT: Negative.    Eyes: Negative.   Respiratory: Negative.    Cardiovascular: Negative.   Gastrointestinal:  Positive for abdominal pain.  Endocrine: Negative.   Genitourinary: Negative.   Musculoskeletal:  Positive for back pain, neck pain and neck stiffness.  Skin: Negative.   Allergic/Immunologic: Negative.   Neurological: Negative.   Hematological: Negative.   Psychiatric/Behavioral: Negative.        Objective:   Physical Exam Vitals and nursing note reviewed.  Constitutional:      Appearance: Normal appearance.  Cardiovascular:     Rate and Rhythm: Normal rate and regular rhythm.     Pulses: Normal pulses.     Heart sounds: Normal heart sounds.  Pulmonary:     Effort: Pulmonary effort is normal.     Breath sounds: Normal breath sounds.  Musculoskeletal:     Cervical back: Normal range of motion and neck supple.     Comments: Normal Muscle Bulk and Muscle Testing Reveals:  Upper Extremities: Full ROM and Muscle Strength 5/5 Thoracic Paraspinal Tenderness: T-3-T-6  Lower  Extremities: Right: Decreased ROM and Muscle Strength 5/5 Right Lower  Extremity Flexion Produces Pain into her Right Lower Extremity Left Lower Extremity: Full ROM and Muscle Strength 5/5 Arises from Table with ease Narrow Based  Gait     Skin:    General: Skin is warm and dry.  Neurological:     Mental Status: She is alert and oriented to person, place, and time.  Psychiatric:        Mood and Affect: Mood normal.         Assessment & Plan:  Chronic Bilateral Low-Back Pain without Sciatica: Continue HEP as Tolerated. Continue Current medication regimen. Continue to monitor. 01/08/2022 Right Greater Trochanter Bursitis:No complaints today.  Continue HEP as Tolerated and Continue to Monitor. 02/032023 Bilateral Hand Pain: No complaints today. Continue HEP as Tolerated. Continue to monitor. 01/08/2022 Chronic Pain Syndrome:  Refilled: Oxycodone 10mg /325 mg one tablet three times  a day as needed for pain #90. We will continue the opioid monitoring program, this consists of regular clinic visits, examinations, urine drug screen, pill counts as well as use of [place] Controlled Substance Reporting system. A 12 month History has been reviewed on the [place] Controlled Substance Reporting System on 01/08/2022 Bilateral Feet Pain: No complaints today. Continue HEP as Tolerated. Continue current medication regimen. Continue to monitor. 01/08/2022 6. Fall at Home: Educated on fall prevention: She verbalizes understanding.  Polyarthralgia: Continue HEP as Tolerated. Continue current medication regimen. Continue to Monitor.    F/U in 1 month

## 2022-02-05 ENCOUNTER — Encounter: Payer: 59 | Admitting: Registered Nurse

## 2022-02-05 ENCOUNTER — Telehealth: Payer: Self-pay | Admitting: Registered Nurse

## 2022-02-05 NOTE — Telephone Encounter (Signed)
Rhonda Bradley said that she is ok with her medication for right now . She said that she will needs to talk to her PCP about her medication for future or use Ibuprofen or Tylenol . Per patient she received a lot of bills and she had to cancel all her future visits .  ?

## 2022-02-15 NOTE — Telephone Encounter (Signed)
The patient cannot come due to financial reason.  ?

## 2022-03-02 ENCOUNTER — Other Ambulatory Visit: Payer: Self-pay | Admitting: Adult Health

## 2022-03-02 DIAGNOSIS — G4700 Insomnia, unspecified: Secondary | ICD-10-CM

## 2022-03-03 NOTE — Telephone Encounter (Signed)
Controlled RF  ?

## 2022-03-19 ENCOUNTER — Ambulatory Visit: Payer: 59 | Admitting: Physical Medicine and Rehabilitation

## 2022-03-19 ENCOUNTER — Other Ambulatory Visit: Payer: Self-pay | Admitting: Geriatric Medicine

## 2022-03-19 DIAGNOSIS — Z1231 Encounter for screening mammogram for malignant neoplasm of breast: Secondary | ICD-10-CM

## 2022-03-29 ENCOUNTER — Ambulatory Visit: Payer: 59 | Admitting: Physical Medicine and Rehabilitation

## 2022-03-30 ENCOUNTER — Ambulatory Visit
Admission: RE | Admit: 2022-03-30 | Discharge: 2022-03-30 | Disposition: A | Discharge status: Home / Self Care | Payer: 59 | Source: Ambulatory Visit | Attending: Geriatric Medicine | Admitting: Geriatric Medicine

## 2022-03-30 DIAGNOSIS — Z1231 Encounter for screening mammogram for malignant neoplasm of breast: Secondary | ICD-10-CM

## 2022-04-15 ENCOUNTER — Other Ambulatory Visit: Payer: Self-pay | Admitting: Nurse Practitioner

## 2022-04-15 DIAGNOSIS — M8580 Other specified disorders of bone density and structure, unspecified site: Secondary | ICD-10-CM

## 2022-04-26 ENCOUNTER — Ambulatory Visit (INDEPENDENT_AMBULATORY_CARE_PROVIDER_SITE_OTHER): Payer: 59 | Admitting: Adult Health

## 2022-04-26 ENCOUNTER — Encounter: Payer: Self-pay | Admitting: Adult Health

## 2022-04-26 DIAGNOSIS — F331 Major depressive disorder, recurrent, moderate: Secondary | ICD-10-CM | POA: Diagnosis not present

## 2022-04-26 DIAGNOSIS — F411 Generalized anxiety disorder: Secondary | ICD-10-CM | POA: Diagnosis not present

## 2022-04-26 DIAGNOSIS — G4700 Insomnia, unspecified: Secondary | ICD-10-CM | POA: Diagnosis not present

## 2022-04-26 MED ORDER — ESZOPICLONE 3 MG PO TABS: ORAL_TABLET | ORAL | 2 refills | Status: DC

## 2022-04-26 NOTE — Progress Notes (Signed)
Rhonda Bradley [id number] 1966/04/07 56 y.o.  Subjective:   Patient ID:  Rhonda Bradley is a 56 y.o. (DOB Nov 13, 1966) female.  Chief Complaint: No chief complaint on file.   HPI Rhonda Bradley presents to the office today for follow-up of GAD, MDD, and insomnia.  Describes mood today as "ok". Pleasant. Tearful at times. Mood symptoms - reports irritability. Denies anxiety and depression. Feels like she is coming out of a depression. Stating "I'm doing ok". Feels like medications continue to work well. Suffers from chronic pain and fatigue from Fibromyalgia - manageable with pain medication. Diagnosed with Fibromyalgia in 2000. Varying interest and motivation. Taking medications as prescribed.  Energy levels improved. Active, does not have a regular exercise with physical limitations Enjoys some usual interests and activities. Married. Lives with husband. Has 3 children - 2 boys and 1 girl - 2 grandchildren. Family local.   Appetite adequate. Weight loss - 183 from 187 pounds. Sleeps better some nights than others. Averages 6 to 8 hours - once she gets to sleep. Napping once a day. Focus and concentration difficulties. Fibromyalgia - cognitive issues "brain fog". Completing tasks. Managing aspects of household. Unable to work. Approved for disability benefits. Denies SI or HI.  Denies AH or VH.  Previous medication trials: Wellbutrin SR, Wellbutrin XL, Cymbalta, Gabapentin   PHQ2-9    Flowsheet Row Office Visit from 01/08/2022 in [facility] Office Visit from 12/10/2021 in [facility] Office Visit from 11/12/2021 in [facility] Office Visit from 09/17/2021 in [facility] Office Visit from 07/15/2021 in [facility]  PHQ-2 Total Score 2 2 0 4 3  PHQ-9 Total Score -- -- -- -- 18        Review of  Systems:  Review of Systems  Musculoskeletal:  Negative for gait problem.  Neurological:  Negative for tremors.  Psychiatric/Behavioral:         Please refer to HPI   Medications: I have reviewed the patient's current medications.  Current Outpatient Medications  Medication Sig Dispense Refill   Acetylcysteine (N-ACETYL-L-CYSTEINE PO) Take by mouth.     albuterol (PROVENTIL HFA;VENTOLIN HFA) 108 (90 Base) MCG/ACT inhaler Inhale 2 puffs into the lungs every 4 (four) hours as needed for wheezing or shortness of breath. 1 Inhaler 2   albuterol (VENTOLIN HFA) 108 (90 Base) MCG/ACT inhaler Inhale 2 puffs into the lungs every 4 (four) hours as needed.     Azelastine-Fluticasone 137-50 MCG/ACT SUSP place 2 sprays into both nostrils twice a day nasal 30 days     budesonide-formoterol (SYMBICORT) 160-4.5 MCG/ACT inhaler INHALE TWO PUFFS TWICE DAILY TO PREVENT COUGH OR WHEEZE. RINSE MOUTH AFTER USE. 10.2 Inhaler 0   diclofenac (VOLTAREN) 75 MG EC tablet Take 75 mg by mouth 2 (two) times daily.     dicyclomine (BENTYL) 20 MG tablet 1 tablet     escitalopram (LEXAPRO) 20 MG tablet Take 1 tablet (20 mg total) by mouth daily. 90 tablet 3   esomeprazole (NEXIUM) 40 MG capsule Take 40 mg by mouth 2 (two) times daily.     Eszopiclone 3 MG TABS Take immediately before bedtime 30 tablet 2   ferrous sulfate 325 (65 FE) MG tablet 1 tablet     fluticasone (FLONASE) 50 MCG/ACT nasal spray Place 2 sprays into both nostrils 2 (two) times daily.     fluticasone (VERAMYST) 27.5 MCG/SPRAY nasal spray  Place 2 sprays into the nose daily.     furosemide (LASIX) 40 MG tablet Take 80 mg by mouth daily. Patient takes as needed per patient     furosemide (LASIX) 80 MG tablet TAKE 1 TABLET ALTERNATE WITH 1/2 TABLET EVERY OTHER DAY AS NEEDED     glimepiride (AMARYL) 2 MG tablet Take 2 mg by mouth daily with breakfast.     glucosamine-chondroitin 500-400 MG tablet Take 1 tablet by mouth 3 (three) times daily.     ibuprofen  (ADVIL) 200 MG tablet 4 tablets with food or milk as needed     irbesartan (AVAPRO) 75 MG tablet Take 37.5 mg by mouth daily.     levothyroxine (SYNTHROID, LEVOTHROID) 100 MCG tablet Take 100 mcg by mouth daily before breakfast.  2   liothyronine (CYTOMEL) 5 MCG tablet TAKE 1 TABLET BY MOUTH TWICE A DAY ON AN EMPTY STOMACH  5   metFORMIN (GLUCOPHAGE-XR) 500 MG 24 hr tablet Take 500 mg by mouth in the morning and at bedtime.  11   methylphenidate 18 MG PO CR tablet 1 tablet in the morning     montelukast (SINGULAIR) 10 MG tablet Take 1 tablet (10 mg total) by mouth at bedtime. 30 tablet 5   oxyCODONE-acetaminophen (PERCOCET) 10-325 MG tablet Take 1 tablet by mouth 3 (three) times daily as needed for pain. 90 tablet 0   Probiotic CHEW See admin instructions.     rosuvastatin (CRESTOR) 5 MG tablet Take 5 mg by mouth daily.     Semaglutide (RYBELSUS) 7 MG TABS Take by mouth.     tiZANidine (ZANAFLEX) 4 MG tablet Take 4 mg by mouth in the morning and at bedtime.     Vitamin D, Ergocalciferol, (DRISDOL) 1.25 MG (50000 UNIT) CAPS capsule Take 1 capsule (50,000 Units total) by mouth every 7 (seven) days. 7 capsule 0   YUVAFEM 10 MCG TABS vaginal tablet Place 1 tablet vaginally 2 (two) times a week.     zinc gluconate 50 MG tablet Take 50 mg by mouth daily.     No current facility-administered medications for this visit.    Medication Side Effects: None  Allergies:  Allergies  Allergen Reactions   5ht3 Receptor Antagonists Other (See Comments)    Other reaction(s): Unknown Other reaction(s): Unknown   Atorvastatin Other (See Comments)    Other reaction(s): muscle pain Other reaction(s): muscle pain   Erythromycin Nausea And Vomiting and Other (See Comments)    Other reaction(s): Upset GI Other reaction(s): Upset GI   Prednisone Other (See Comments)   Metronidazole Rash and Other (See Comments)    Other reaction(s): rash   Other Rash   Penicillins Rash    Other reaction(s): rash Other  reaction(s): rash   Sulfamethoxazole-Trimethoprim Rash and Other (See Comments)    Other reaction(s): rash Other reaction(s): rash  Other reaction(s): rash    Past Medical History:  Diagnosis Date   Anemia    as a child    Anxiety    Asthma    Back pain    Chronic fatigue syndrome    Complication of anesthesia    slow to wake up age 23, but since cholecystectomy and no problems    Constipation    Depression    Diabetes ([place])    Family history of anesthesia complication    father slow to wake up    Fatty liver    Fibromyalgia    GERD (gastroesophageal reflux disease)    Gestational diabetes  Headache    High blood pressure    High cholesterol    Hypertension    IBS (irritable bowel syndrome)    Joint pain    Lactose intolerance    Osteopenia    SOB (shortness of breath)     Past Medical History, Surgical history, Social history, and Family history were reviewed and updated as appropriate.   Please see review of systems for further details on the patient's review from today.   Objective:   Physical Exam:  There were no vitals taken for this visit.  Physical Exam Constitutional:      General: She is not in acute distress. Musculoskeletal:        General: No deformity.  Neurological:     Mental Status: She is alert and oriented to person, place, and time.     Coordination: Coordination normal.  Psychiatric:        Attention and Perception: Attention and perception normal. She does not perceive auditory or visual hallucinations.        Mood and Affect: Mood normal. Mood is not anxious or depressed. Affect is not labile, blunt, angry or inappropriate.        Speech: Speech normal.        Behavior: Behavior normal.        Thought Content: Thought content normal. Thought content is not paranoid or delusional. Thought content does not include homicidal or suicidal ideation. Thought content does not include homicidal or suicidal plan.        Cognition and Memory:  Cognition and memory normal.        Judgment: Judgment normal.     Comments: Insight intact    Lab Review:     Component Value Date/Time   NA 142 07/05/2019 1048   K 4.7 07/05/2019 1048   CL 99 07/05/2019 1048   CO2 22 07/05/2019 1048   GLUCOSE 167 (H) 07/05/2019 1048   GLUCOSE 192 (H) 09/20/2014 0445   BUN 12 07/05/2019 1048   CREATININE 0.65 07/05/2019 1048   CALCIUM 10.1 07/05/2019 1048   PROT 7.2 07/05/2019 1048   ALBUMIN 5.1 (H) 07/05/2019 1048   AST 26 07/05/2019 1048   ALT 56 (H) 07/05/2019 1048   ALKPHOS 59 07/05/2019 1048   BILITOT 0.5 07/05/2019 1048   GFRNONAA 101 07/05/2019 1048   GFRAA 116 07/05/2019 1048       Component Value Date/Time   WBC 8.1 01/30/2019 1808   WBC 9.2 09/12/2014 1515   RBC 5.14 01/30/2019 1808   RBC 4.88 09/12/2014 1515   HGB 14.7 01/30/2019 1808   HCT 43.2 01/30/2019 1808   PLT 363 01/30/2019 1808   MCV 84 01/30/2019 1808   MCH 28.6 01/30/2019 1808   MCH 29.9 09/12/2014 1515   MCHC 34.0 01/30/2019 1808   MCHC 33.7 09/12/2014 1515   RDW 13.3 01/30/2019 1808   LYMPHSABS 3.5 (H) 01/30/2019 1808   MONOABS 0.5 09/05/2010 2335   EOSABS 0.1 01/30/2019 1808   BASOSABS 0.1 01/30/2019 1808    No results found for: POCLITH, LITHIUM   No results found for: PHENYTOIN, PHENOBARB, VALPROATE, CBMZ   .res Assessment: Plan:    Plan:  PDMP reviewed  1. Lunesta '3mg'$  at bedtime for sleep 2. Lexapro '20mg'$  daily  RTC 6 months  Patient advised to contact office with any questions, adverse effects, or acute worsening in signs and symptoms.  Discussed potential metabolic side effects associated with atypical antipsychotics, as well as potential risk for movement side  effects. Advised pt to contact office if movement side effects occur.   Discussed potential benefits, risks, and side effects of stimulants with patient to include increased heart rate, palpitations, insomnia, increased anxiety, increased irritability, or decreased appetite.   Instructed patient to contact office if experiencing any significant tolerability issues.   Diagnoses and all orders for this visit:  Major depressive disorder, recurrent episode, moderate (HCC)  Insomnia, unspecified type -     Eszopiclone 3 MG TABS; Take immediately before bedtime  Generalized anxiety disorder     Please see After Visit Summary for patient specific instructions.  Future Appointments  Date Time Provider [place]  10/01/2022  3:00 PM [facility] DX DEXA 1 [facility]    No orders of the defined types were placed in this encounter.   -------------------------------

## 2022-04-27 ENCOUNTER — Ambulatory Visit: Payer: 59 | Admitting: Physical Medicine and Rehabilitation

## 2022-06-01 ENCOUNTER — Ambulatory Visit: Payer: 59 | Admitting: Physical Medicine and Rehabilitation

## 2022-06-24 ENCOUNTER — Ambulatory Visit: Payer: 59 | Admitting: Physical Medicine and Rehabilitation

## 2022-06-29 ENCOUNTER — Ambulatory Visit: Payer: 59 | Admitting: Physical Medicine and Rehabilitation

## 2022-07-14 ENCOUNTER — Encounter (INDEPENDENT_AMBULATORY_CARE_PROVIDER_SITE_OTHER): Payer: Self-pay

## 2022-07-22 ENCOUNTER — Ambulatory Visit: Payer: 59 | Admitting: Physical Medicine and Rehabilitation

## 2022-08-23 DIAGNOSIS — L538 Other specified erythematous conditions: Secondary | ICD-10-CM | POA: Diagnosis not present

## 2022-08-23 DIAGNOSIS — L820 Inflamed seborrheic keratosis: Secondary | ICD-10-CM | POA: Diagnosis not present

## 2022-08-26 ENCOUNTER — Ambulatory Visit: Payer: 59 | Admitting: Physical Medicine and Rehabilitation

## 2022-08-31 DIAGNOSIS — G63 Polyneuropathy in diseases classified elsewhere: Secondary | ICD-10-CM | POA: Diagnosis not present

## 2022-08-31 DIAGNOSIS — E261 Secondary hyperaldosteronism: Secondary | ICD-10-CM | POA: Diagnosis not present

## 2022-08-31 DIAGNOSIS — I130 Hypertensive heart and chronic kidney disease with heart failure and stage 1 through stage 4 chronic kidney disease, or unspecified chronic kidney disease: Secondary | ICD-10-CM | POA: Diagnosis not present

## 2022-08-31 DIAGNOSIS — E1169 Type 2 diabetes mellitus with other specified complication: Secondary | ICD-10-CM | POA: Diagnosis not present

## 2022-08-31 DIAGNOSIS — I509 Heart failure, unspecified: Secondary | ICD-10-CM | POA: Diagnosis not present

## 2022-08-31 DIAGNOSIS — F3342 Major depressive disorder, recurrent, in full remission: Secondary | ICD-10-CM | POA: Diagnosis not present

## 2022-08-31 DIAGNOSIS — E1122 Type 2 diabetes mellitus with diabetic chronic kidney disease: Secondary | ICD-10-CM | POA: Diagnosis not present

## 2022-08-31 DIAGNOSIS — E038 Other specified hypothyroidism: Secondary | ICD-10-CM | POA: Diagnosis not present

## 2022-08-31 DIAGNOSIS — E039 Hypothyroidism, unspecified: Secondary | ICD-10-CM | POA: Diagnosis not present

## 2022-08-31 DIAGNOSIS — E663 Overweight: Secondary | ICD-10-CM | POA: Diagnosis not present

## 2022-08-31 DIAGNOSIS — F419 Anxiety disorder, unspecified: Secondary | ICD-10-CM | POA: Diagnosis not present

## 2022-08-31 DIAGNOSIS — E785 Hyperlipidemia, unspecified: Secondary | ICD-10-CM | POA: Diagnosis not present

## 2022-09-02 ENCOUNTER — Other Ambulatory Visit: Payer: Self-pay | Admitting: Adult Health

## 2022-09-02 DIAGNOSIS — G4700 Insomnia, unspecified: Secondary | ICD-10-CM

## 2022-09-07 DIAGNOSIS — M545 Low back pain, unspecified: Secondary | ICD-10-CM | POA: Diagnosis not present

## 2022-09-07 DIAGNOSIS — G894 Chronic pain syndrome: Secondary | ICD-10-CM | POA: Diagnosis not present

## 2022-09-07 DIAGNOSIS — M7918 Myalgia, other site: Secondary | ICD-10-CM | POA: Diagnosis not present

## 2022-09-07 DIAGNOSIS — E119 Type 2 diabetes mellitus without complications: Secondary | ICD-10-CM | POA: Diagnosis not present

## 2022-09-07 DIAGNOSIS — E890 Postprocedural hypothyroidism: Secondary | ICD-10-CM | POA: Diagnosis not present

## 2022-09-15 DIAGNOSIS — G894 Chronic pain syndrome: Secondary | ICD-10-CM | POA: Diagnosis not present

## 2022-09-15 DIAGNOSIS — M545 Low back pain, unspecified: Secondary | ICD-10-CM | POA: Diagnosis not present

## 2022-09-15 DIAGNOSIS — M7918 Myalgia, other site: Secondary | ICD-10-CM | POA: Diagnosis not present

## 2022-09-21 DIAGNOSIS — G894 Chronic pain syndrome: Secondary | ICD-10-CM | POA: Diagnosis not present

## 2022-09-21 DIAGNOSIS — M545 Low back pain, unspecified: Secondary | ICD-10-CM | POA: Diagnosis not present

## 2022-09-21 DIAGNOSIS — M7918 Myalgia, other site: Secondary | ICD-10-CM | POA: Diagnosis not present

## 2022-09-23 ENCOUNTER — Ambulatory Visit: Payer: 59 | Admitting: Physical Medicine and Rehabilitation

## 2022-09-27 DIAGNOSIS — M7918 Myalgia, other site: Secondary | ICD-10-CM | POA: Diagnosis not present

## 2022-09-27 DIAGNOSIS — M545 Low back pain, unspecified: Secondary | ICD-10-CM | POA: Diagnosis not present

## 2022-09-27 DIAGNOSIS — G894 Chronic pain syndrome: Secondary | ICD-10-CM | POA: Diagnosis not present

## 2022-10-01 ENCOUNTER — Ambulatory Visit
Admission: RE | Admit: 2022-10-01 | Discharge: 2022-10-01 | Disposition: A | Discharge status: Home / Self Care | Payer: PPO | Source: Ambulatory Visit | Attending: Nurse Practitioner | Admitting: Nurse Practitioner

## 2022-10-01 DIAGNOSIS — M8589 Other specified disorders of bone density and structure, multiple sites: Secondary | ICD-10-CM | POA: Diagnosis not present

## 2022-10-01 DIAGNOSIS — Z780 Asymptomatic menopausal state: Secondary | ICD-10-CM | POA: Diagnosis not present

## 2022-10-01 DIAGNOSIS — M8580 Other specified disorders of bone density and structure, unspecified site: Secondary | ICD-10-CM

## 2022-10-07 DIAGNOSIS — M545 Low back pain, unspecified: Secondary | ICD-10-CM | POA: Diagnosis not present

## 2022-10-07 DIAGNOSIS — G894 Chronic pain syndrome: Secondary | ICD-10-CM | POA: Diagnosis not present

## 2022-10-07 DIAGNOSIS — M7918 Myalgia, other site: Secondary | ICD-10-CM | POA: Diagnosis not present

## 2022-10-13 DIAGNOSIS — M7918 Myalgia, other site: Secondary | ICD-10-CM | POA: Diagnosis not present

## 2022-10-13 DIAGNOSIS — M545 Low back pain, unspecified: Secondary | ICD-10-CM | POA: Diagnosis not present

## 2022-10-13 DIAGNOSIS — G894 Chronic pain syndrome: Secondary | ICD-10-CM | POA: Diagnosis not present

## 2022-10-18 DIAGNOSIS — M7918 Myalgia, other site: Secondary | ICD-10-CM | POA: Diagnosis not present

## 2022-10-18 DIAGNOSIS — G894 Chronic pain syndrome: Secondary | ICD-10-CM | POA: Diagnosis not present

## 2022-10-18 DIAGNOSIS — M545 Low back pain, unspecified: Secondary | ICD-10-CM | POA: Diagnosis not present

## 2022-10-21 ENCOUNTER — Ambulatory Visit: Payer: 59 | Admitting: Physical Medicine and Rehabilitation

## 2022-10-25 DIAGNOSIS — M545 Low back pain, unspecified: Secondary | ICD-10-CM | POA: Diagnosis not present

## 2022-10-25 DIAGNOSIS — G894 Chronic pain syndrome: Secondary | ICD-10-CM | POA: Diagnosis not present

## 2022-10-25 DIAGNOSIS — M7918 Myalgia, other site: Secondary | ICD-10-CM | POA: Diagnosis not present

## 2022-10-27 ENCOUNTER — Encounter: Payer: Self-pay | Admitting: Adult Health

## 2022-10-27 ENCOUNTER — Ambulatory Visit (INDEPENDENT_AMBULATORY_CARE_PROVIDER_SITE_OTHER): Payer: PPO | Admitting: Adult Health

## 2022-10-27 DIAGNOSIS — G4700 Insomnia, unspecified: Secondary | ICD-10-CM

## 2022-10-27 DIAGNOSIS — F411 Generalized anxiety disorder: Secondary | ICD-10-CM

## 2022-10-27 DIAGNOSIS — F331 Major depressive disorder, recurrent, moderate: Secondary | ICD-10-CM | POA: Diagnosis not present

## 2022-10-27 DIAGNOSIS — R41840 Attention and concentration deficit: Secondary | ICD-10-CM

## 2022-10-27 MED ORDER — ESZOPICLONE 3 MG PO TABS: ORAL_TABLET | ORAL | 2 refills | Status: DC

## 2022-10-27 MED ORDER — ESCITALOPRAM OXALATE 20 MG PO TABS: 20.0000 mg | ORAL_TABLET | Freq: Every day | ORAL | 3 refills | Status: DC

## 2022-10-27 NOTE — Progress Notes (Signed)
[name] [id number] 1966-04-27 56 y.o.  Subjective:   Patient ID:  Rhonda Bradley is a 56 y.o. (DOB 05/26/1966) female.  Chief Complaint: No chief complaint on file.   HPI [name] presents to the office today for follow-up of cognitive attention deficit, GAD, MDD, and insomnia.  Describes mood today as "ok". Pleasant. Tearful at times. Mood symptoms - denies depression and irritability. Reports increased anxiety. Stating "I've had anxiety since I was a little girl". Mood is consistent. Stating "I'm doing alright". Feels like medications are helpful to manage mood symptoms. Suffers from chronic pain and fatigue from Fibromyalgia (diagnosed in 2000) - manageable with pain medication. Varying interest and motivation. Taking medications as prescribed.  Energy levels vary. Active, does not have a regular exercise with physical limitations Enjoys some usual interests and activities. Married. Lives with husband. Has 3 children - 2 boys and 1 girl - 7 grandchildren. Family local.   Appetite adequate. Weight loss - 185 pounds. Sleeps better some nights than others. Averages 6 to 8 hours. Napping during the day. Focus and concentration difficulties. Fibromyalgia - cognitive issues "brain fog". Completing tasks. Managing aspects of household. Unable to work - receiving disability benefits since 10/2022. Denies SI or HI.  Denies AH or VH.  Previous medication trials: Wellbutrin SR, Wellbutrin XL, Cymbalta, Gabapentin   PHQ2-9    Flowsheet Row Office Visit from 01/08/2022 in [facility] Office Visit from 12/10/2021 in [facility] Office Visit from 11/12/2021 in [facility] Office Visit from 09/17/2021 in [facility] Office Visit from 07/15/2021 in [facility]  PHQ-2 Total Score 2 2 0 4 3  PHQ-9  Total Score -- -- -- -- 18        Review of Systems:  Review of Systems  Musculoskeletal:  Negative for gait problem.  Neurological:  Negative for tremors.  Psychiatric/Behavioral:         Please refer to HPI    Medications: I have reviewed the patient's current medications.  Current Outpatient Medications  Medication Sig Dispense Refill   Acetylcysteine (N-ACETYL-L-CYSTEINE PO) Take by mouth.     albuterol (PROVENTIL HFA;VENTOLIN HFA) 108 (90 Base) MCG/ACT inhaler Inhale 2 puffs into the lungs every 4 (four) hours as needed for wheezing or shortness of breath. 1 Inhaler 2   albuterol (VENTOLIN HFA) 108 (90 Base) MCG/ACT inhaler Inhale 2 puffs into the lungs every 4 (four) hours as needed.     Azelastine-Fluticasone 137-50 MCG/ACT SUSP place 2 sprays into both nostrils twice a day nasal 30 days     budesonide-formoterol (SYMBICORT) 160-4.5 MCG/ACT inhaler INHALE TWO PUFFS TWICE DAILY TO PREVENT COUGH OR WHEEZE. RINSE MOUTH AFTER USE. 10.2 Inhaler 0   diclofenac (VOLTAREN) 75 MG EC tablet Take 75 mg by mouth 2 (two) times daily.     dicyclomine (BENTYL) 20 MG tablet 1 tablet     escitalopram (LEXAPRO) 20 MG tablet Take 1 tablet (20 mg total) by mouth daily. 90 tablet 3   esomeprazole (NEXIUM) 40 MG capsule Take 40 mg by mouth 2 (two) times daily.     Eszopiclone 3 MG TABS Take immediately before bedtime 30 tablet 2   ferrous sulfate 325 (65 FE) MG tablet 1 tablet     fluticasone (FLONASE) 50 MCG/ACT nasal spray Place 2 sprays into both nostrils 2 (two) times daily.     fluticasone (VERAMYST) 27.5  MCG/SPRAY nasal spray Place 2 sprays into the nose daily.     furosemide (LASIX) 40 MG tablet Take 80 mg by mouth daily. Patient takes as needed per patient     furosemide (LASIX) 80 MG tablet TAKE 1 TABLET ALTERNATE WITH 1/2 TABLET EVERY OTHER DAY AS NEEDED     glimepiride (AMARYL) 2 MG tablet Take 2 mg by mouth daily with breakfast.     glucosamine-chondroitin 500-400 MG tablet Take 1  tablet by mouth 3 (three) times daily.     ibuprofen (ADVIL) 200 MG tablet 4 tablets with food or milk as needed     irbesartan (AVAPRO) 75 MG tablet Take 37.5 mg by mouth daily.     levothyroxine (SYNTHROID, LEVOTHROID) 100 MCG tablet Take 100 mcg by mouth daily before breakfast.  2   liothyronine (CYTOMEL) 5 MCG tablet TAKE 1 TABLET BY MOUTH TWICE A DAY ON AN EMPTY STOMACH  5   metFORMIN (GLUCOPHAGE-XR) 500 MG 24 hr tablet Take 500 mg by mouth in the morning and at bedtime.  11   methylphenidate 18 MG PO CR tablet 1 tablet in the morning     montelukast (SINGULAIR) 10 MG tablet Take 1 tablet (10 mg total) by mouth at bedtime. 30 tablet 5   oxyCODONE-acetaminophen (PERCOCET) 10-325 MG tablet Take 1 tablet by mouth 3 (three) times daily as needed for pain. 90 tablet 0   Probiotic CHEW See admin instructions.     rosuvastatin (CRESTOR) 5 MG tablet Take 5 mg by mouth daily.     Semaglutide (RYBELSUS) 7 MG TABS Take by mouth.     tiZANidine (ZANAFLEX) 4 MG tablet Take 4 mg by mouth in the morning and at bedtime.     Vitamin D, Ergocalciferol, (DRISDOL) 1.25 MG (50000 UNIT) CAPS capsule Take 1 capsule (50,000 Units total) by mouth every 7 (seven) days. 7 capsule 0   YUVAFEM 10 MCG TABS vaginal tablet Place 1 tablet vaginally 2 (two) times a week.     zinc gluconate 50 MG tablet Take 50 mg by mouth daily.     No current facility-administered medications for this visit.    Medication Side Effects: None  Allergies:  Allergies  Allergen Reactions   5ht3 Receptor Antagonists Other (See Comments)    Other reaction(s): Unknown Other reaction(s): Unknown   Atorvastatin Other (See Comments)    Other reaction(s): muscle pain Other reaction(s): muscle pain   Erythromycin Nausea And Vomiting and Other (See Comments)    Other reaction(s): Upset GI Other reaction(s): Upset GI   Prednisone Other (See Comments)   Metronidazole Rash and Other (See Comments)    Other reaction(s): rash   Other Rash    Penicillins Rash    Other reaction(s): rash Other reaction(s): rash   Sulfamethoxazole-Trimethoprim Rash and Other (See Comments)    Other reaction(s): rash Other reaction(s): rash  Other reaction(s): rash    Past Medical History:  Diagnosis Date   Anemia    as a child    Anxiety    Asthma    Back pain    Chronic fatigue syndrome    Complication of anesthesia    slow to wake up age 34, but since cholecystectomy and no problems    Constipation    Depression    Diabetes ([place])    Family history of anesthesia complication    father slow to wake up    Fatty liver    Fibromyalgia    GERD (gastroesophageal reflux disease)  Gestational diabetes    Headache    High blood pressure    High cholesterol    Hypertension    IBS (irritable bowel syndrome)    Joint pain    Lactose intolerance    Osteopenia    SOB (shortness of breath)     Past Medical History, Surgical history, Social history, and Family history were reviewed and updated as appropriate.   Please see review of systems for further details on the patient's review from today.   Objective:   Physical Exam:  There were no vitals taken for this visit.  Physical Exam Constitutional:      General: She is not in acute distress. Musculoskeletal:        General: No deformity.  Neurological:     Mental Status: She is alert and oriented to person, place, and time.     Coordination: Coordination normal.  Psychiatric:        Attention and Perception: Attention and perception normal. She does not perceive auditory or visual hallucinations.        Mood and Affect: Mood normal. Mood is not anxious or depressed. Affect is not labile, blunt, angry or inappropriate.        Speech: Speech normal.        Behavior: Behavior normal.        Thought Content: Thought content normal. Thought content is not paranoid or delusional. Thought content does not include homicidal or suicidal ideation. Thought content does not include  homicidal or suicidal plan.        Cognition and Memory: Cognition and memory normal.        Judgment: Judgment normal.     Comments: Insight intact     Lab Review:     Component Value Date/Time   NA 142 07/05/2019 1048   K 4.7 07/05/2019 1048   CL 99 07/05/2019 1048   CO2 22 07/05/2019 1048   GLUCOSE 167 (H) 07/05/2019 1048   GLUCOSE 192 (H) 09/20/2014 0445   BUN 12 07/05/2019 1048   CREATININE 0.65 07/05/2019 1048   CALCIUM 10.1 07/05/2019 1048   PROT 7.2 07/05/2019 1048   ALBUMIN 5.1 (H) 07/05/2019 1048   AST 26 07/05/2019 1048   ALT 56 (H) 07/05/2019 1048   ALKPHOS 59 07/05/2019 1048   BILITOT 0.5 07/05/2019 1048   GFRNONAA 101 07/05/2019 1048   GFRAA 116 07/05/2019 1048       Component Value Date/Time   WBC 8.1 01/30/2019 1808   WBC 9.2 09/12/2014 1515   RBC 5.14 01/30/2019 1808   RBC 4.88 09/12/2014 1515   HGB 14.7 01/30/2019 1808   HCT 43.2 01/30/2019 1808   PLT 363 01/30/2019 1808   MCV 84 01/30/2019 1808   MCH 28.6 01/30/2019 1808   MCH 29.9 09/12/2014 1515   MCHC 34.0 01/30/2019 1808   MCHC 33.7 09/12/2014 1515   RDW 13.3 01/30/2019 1808   LYMPHSABS 3.5 (H) 01/30/2019 1808   MONOABS 0.5 09/05/2010 2335   EOSABS 0.1 01/30/2019 1808   BASOSABS 0.1 01/30/2019 1808    No results found for: "POCLITH", "LITHIUM"   No results found for: "PHENYTOIN", "PHENOBARB", "VALPROATE", "CBMZ"   .res Assessment: Plan:    Plan:  PDMP reviewed  1. Lunesta '3mg'$  at bedtime for sleep 2. Lexapro '20mg'$  daily  RTC 6 months  Patient advised to contact office with any questions, adverse effects, or acute worsening in signs and symptoms.  Discussed potential metabolic side effects associated with atypical antipsychotics, as well  as potential risk for movement side effects. Advised pt to contact office if movement side effects occur.   Discussed potential benefits, risks, and side effects of stimulants with patient to include increased heart rate, palpitations,  insomnia, increased anxiety, increased irritability, or decreased appetite.  Instructed patient to contact office if experiencing any significant tolerability issues.   Diagnoses and all orders for this visit:  Cognitive attention deficit  Major depressive disorder, recurrent episode, moderate (HCC) -     escitalopram (LEXAPRO) 20 MG tablet; Take 1 tablet (20 mg total) by mouth daily.  Generalized anxiety disorder -     escitalopram (LEXAPRO) 20 MG tablet; Take 1 tablet (20 mg total) by mouth daily.  Insomnia, unspecified type -     Eszopiclone 3 MG TABS; Take immediately before bedtime     Please see After Visit Summary for patient specific instructions.  No future appointments.   No orders of the defined types were placed in this encounter.   -------------------------------

## 2022-11-01 DIAGNOSIS — G894 Chronic pain syndrome: Secondary | ICD-10-CM | POA: Diagnosis not present

## 2022-11-01 DIAGNOSIS — M7918 Myalgia, other site: Secondary | ICD-10-CM | POA: Diagnosis not present

## 2022-11-01 DIAGNOSIS — M545 Low back pain, unspecified: Secondary | ICD-10-CM | POA: Diagnosis not present

## 2022-11-04 DIAGNOSIS — E890 Postprocedural hypothyroidism: Secondary | ICD-10-CM | POA: Diagnosis not present

## 2022-11-09 DIAGNOSIS — M7918 Myalgia, other site: Secondary | ICD-10-CM | POA: Diagnosis not present

## 2022-11-09 DIAGNOSIS — G894 Chronic pain syndrome: Secondary | ICD-10-CM | POA: Diagnosis not present

## 2022-11-09 DIAGNOSIS — M545 Low back pain, unspecified: Secondary | ICD-10-CM | POA: Diagnosis not present

## 2022-11-15 ENCOUNTER — Ambulatory Visit: Payer: 59 | Admitting: Physical Medicine and Rehabilitation

## 2022-11-15 DIAGNOSIS — L820 Inflamed seborrheic keratosis: Secondary | ICD-10-CM | POA: Diagnosis not present

## 2022-11-15 DIAGNOSIS — L538 Other specified erythematous conditions: Secondary | ICD-10-CM | POA: Diagnosis not present

## 2022-11-15 DIAGNOSIS — D485 Neoplasm of uncertain behavior of skin: Secondary | ICD-10-CM | POA: Diagnosis not present

## 2022-11-15 DIAGNOSIS — L439 Lichen planus, unspecified: Secondary | ICD-10-CM | POA: Diagnosis not present

## 2022-11-15 DIAGNOSIS — L570 Actinic keratosis: Secondary | ICD-10-CM | POA: Diagnosis not present

## 2022-11-16 DIAGNOSIS — G894 Chronic pain syndrome: Secondary | ICD-10-CM | POA: Diagnosis not present

## 2022-11-16 DIAGNOSIS — M7918 Myalgia, other site: Secondary | ICD-10-CM | POA: Diagnosis not present

## 2022-11-16 DIAGNOSIS — M545 Low back pain, unspecified: Secondary | ICD-10-CM | POA: Diagnosis not present

## 2022-11-18 ENCOUNTER — Ambulatory Visit: Payer: 59 | Admitting: Physical Medicine and Rehabilitation

## 2022-11-23 DIAGNOSIS — G894 Chronic pain syndrome: Secondary | ICD-10-CM | POA: Diagnosis not present

## 2022-11-23 DIAGNOSIS — M545 Low back pain, unspecified: Secondary | ICD-10-CM | POA: Diagnosis not present

## 2022-11-23 DIAGNOSIS — M7918 Myalgia, other site: Secondary | ICD-10-CM | POA: Diagnosis not present

## 2022-11-30 DIAGNOSIS — E1165 Type 2 diabetes mellitus with hyperglycemia: Secondary | ICD-10-CM | POA: Diagnosis not present

## 2022-11-30 DIAGNOSIS — E890 Postprocedural hypothyroidism: Secondary | ICD-10-CM | POA: Diagnosis not present

## 2022-12-01 DIAGNOSIS — M7918 Myalgia, other site: Secondary | ICD-10-CM | POA: Diagnosis not present

## 2022-12-01 DIAGNOSIS — G894 Chronic pain syndrome: Secondary | ICD-10-CM | POA: Diagnosis not present

## 2022-12-01 DIAGNOSIS — M545 Low back pain, unspecified: Secondary | ICD-10-CM | POA: Diagnosis not present

## 2022-12-07 DIAGNOSIS — M545 Low back pain, unspecified: Secondary | ICD-10-CM | POA: Diagnosis not present

## 2022-12-07 DIAGNOSIS — M7918 Myalgia, other site: Secondary | ICD-10-CM | POA: Diagnosis not present

## 2022-12-07 DIAGNOSIS — G894 Chronic pain syndrome: Secondary | ICD-10-CM | POA: Diagnosis not present

## 2022-12-21 DIAGNOSIS — M7918 Myalgia, other site: Secondary | ICD-10-CM | POA: Diagnosis not present

## 2022-12-21 DIAGNOSIS — G894 Chronic pain syndrome: Secondary | ICD-10-CM | POA: Diagnosis not present

## 2022-12-21 DIAGNOSIS — M545 Low back pain, unspecified: Secondary | ICD-10-CM | POA: Diagnosis not present

## 2022-12-28 DIAGNOSIS — M545 Low back pain, unspecified: Secondary | ICD-10-CM | POA: Diagnosis not present

## 2022-12-28 DIAGNOSIS — M7918 Myalgia, other site: Secondary | ICD-10-CM | POA: Diagnosis not present

## 2022-12-28 DIAGNOSIS — G894 Chronic pain syndrome: Secondary | ICD-10-CM | POA: Diagnosis not present

## 2023-01-03 DIAGNOSIS — R5383 Other fatigue: Secondary | ICD-10-CM | POA: Diagnosis not present

## 2023-01-03 DIAGNOSIS — R52 Pain, unspecified: Secondary | ICD-10-CM | POA: Diagnosis not present

## 2023-01-03 DIAGNOSIS — R6889 Other general symptoms and signs: Secondary | ICD-10-CM | POA: Diagnosis not present

## 2023-01-10 DIAGNOSIS — E890 Postprocedural hypothyroidism: Secondary | ICD-10-CM | POA: Diagnosis not present

## 2023-01-18 DIAGNOSIS — G894 Chronic pain syndrome: Secondary | ICD-10-CM | POA: Diagnosis not present

## 2023-01-18 DIAGNOSIS — M545 Low back pain, unspecified: Secondary | ICD-10-CM | POA: Diagnosis not present

## 2023-01-18 DIAGNOSIS — M7918 Myalgia, other site: Secondary | ICD-10-CM | POA: Diagnosis not present

## 2023-03-07 ENCOUNTER — Other Ambulatory Visit: Payer: Self-pay | Admitting: Adult Health

## 2023-03-07 DIAGNOSIS — G4700 Insomnia, unspecified: Secondary | ICD-10-CM

## 2023-03-27 ENCOUNTER — Other Ambulatory Visit: Payer: Self-pay

## 2023-03-27 ENCOUNTER — Emergency Department (HOSPITAL_COMMUNITY)
Admission: EM | Admit: 2023-03-27 | Discharge: 2023-03-27 | Disposition: A | Discharge status: Home / Self Care | Payer: PPO | Attending: Emergency Medicine | Admitting: Emergency Medicine

## 2023-03-27 ENCOUNTER — Encounter (HOSPITAL_COMMUNITY): Payer: Self-pay | Admitting: Emergency Medicine

## 2023-03-27 ENCOUNTER — Emergency Department (HOSPITAL_COMMUNITY): Payer: PPO

## 2023-03-27 DIAGNOSIS — E1165 Type 2 diabetes mellitus with hyperglycemia: Secondary | ICD-10-CM | POA: Diagnosis not present

## 2023-03-27 DIAGNOSIS — R112 Nausea with vomiting, unspecified: Secondary | ICD-10-CM | POA: Insufficient documentation

## 2023-03-27 DIAGNOSIS — R519 Headache, unspecified: Secondary | ICD-10-CM | POA: Insufficient documentation

## 2023-03-27 DIAGNOSIS — J45909 Unspecified asthma, uncomplicated: Secondary | ICD-10-CM | POA: Diagnosis not present

## 2023-03-27 DIAGNOSIS — R072 Precordial pain: Secondary | ICD-10-CM | POA: Diagnosis not present

## 2023-03-27 DIAGNOSIS — R0602 Shortness of breath: Secondary | ICD-10-CM | POA: Diagnosis not present

## 2023-03-27 DIAGNOSIS — I10 Essential (primary) hypertension: Secondary | ICD-10-CM | POA: Diagnosis not present

## 2023-03-27 DIAGNOSIS — R079 Chest pain, unspecified: Secondary | ICD-10-CM | POA: Diagnosis present

## 2023-03-27 LAB — CBC
HCT: 43.5 % (ref 36.0–46.0)
Hemoglobin: 14 g/dL (ref 12.0–15.0)
MCH: 27.2 pg (ref 26.0–34.0)
MCHC: 32.2 g/dL (ref 30.0–36.0)
MCV: 84.6 fL (ref 80.0–100.0)
Platelets: 363 10*3/uL (ref 150–400)
RBC: 5.14 MIL/uL — ABNORMAL HIGH (ref 3.87–5.11)
RDW: 13.8 % (ref 11.5–15.5)
WBC: 9.9 10*3/uL (ref 4.0–10.5)
nRBC: 0 % (ref 0.0–0.2)

## 2023-03-27 LAB — BASIC METABOLIC PANEL
Anion gap: 11 (ref 5–15)
BUN: 14 mg/dL (ref 6–20)
CO2: 20 mmol/L — ABNORMAL LOW (ref 22–32)
Calcium: 9.1 mg/dL (ref 8.9–10.3)
Chloride: 104 mmol/L (ref 98–111)
Creatinine, Ser: 0.76 mg/dL (ref 0.44–1.00)
GFR, Estimated: >60 mL/min (ref >60)
Glucose, Bld: 222 mg/dL — ABNORMAL HIGH (ref 70–99)
Potassium: 3.6 mmol/L (ref 3.5–5.1)
Sodium: 135 mmol/L (ref 135–145)

## 2023-03-27 LAB — D-DIMER, QUANTITATIVE: D-Dimer, Quant: 0.34 ug/mL-FEU (ref 0.00–0.50)

## 2023-03-27 LAB — TROPONIN I (HIGH SENSITIVITY)
Troponin I (High Sensitivity): 5 ng/L (ref <18)
Troponin I (High Sensitivity): 4 ng/L (ref <18)

## 2023-03-27 LAB — BRAIN NATRIURETIC PEPTIDE: B Natriuretic Peptide: 16.8 pg/mL (ref 0.0–100.0)

## 2023-03-27 MED ORDER — OXYCODONE-ACETAMINOPHEN 5-325 MG PO TABS
2.0000 | ORAL_TABLET | Freq: Once | ORAL | Status: AC
  Administered 2023-03-27: 2 via ORAL
  Filled 2023-03-27: qty 2

## 2023-03-27 MED ORDER — METOCLOPRAMIDE HCL 10 MG PO TABS
10.0000 mg | ORAL_TABLET | Freq: Once | ORAL | Status: AC
  Administered 2023-03-27: 10 mg via ORAL
  Filled 2023-03-27: qty 1

## 2023-03-27 NOTE — ED Provider Notes (Signed)
Emergency Department Provider Note   I have reviewed the triage vital signs and the nursing notes.   HISTORY  Chief Complaint Chest Pain and Shortness of Breath   HPI Rhonda Bradley is a 57 y.o. female past history of chronic fatigue syndrome, fibromyalgia, GERD, hypertension presents to the emergency department with spasming back pain radiating to the chest and jaw.  Symptoms began while at church.  She has had similar symptoms with her fibromyalgia in the past with states this was much more sudden in onset and more severe.  She had associated nausea with headache and vomiting.  Some diaphoresis.  She feels a mild shortness of breath sensation like she needs to take a deep breath. No specific pleuritic pain.  She did take Percocet this morning prior to going to church which is not unusual for her. No fever or chills.   Past Medical History:  Diagnosis Date   Anemia    as a child    Anxiety    Asthma    Back pain    Chronic fatigue syndrome    Complication of anesthesia    slow to wake up age 2, but since cholecystectomy and no problems    Constipation    Depression    Diabetes    Family history of anesthesia complication    father slow to wake up    Fatty liver    Fibromyalgia    GERD (gastroesophageal reflux disease)    Gestational diabetes    Headache    High blood pressure    High cholesterol    Hypertension    IBS (irritable bowel syndrome)    Joint pain    Lactose intolerance    Osteopenia    SOB (shortness of breath)     Review of Systems  Constitutional: No fever/chills Cardiovascular: Positive chest pain. Respiratory: Positive shortness of breath. Gastrointestinal: Positive abdominal pain. Positive nausea, no vomiting.  No diarrhea.  No constipation. Genitourinary: Negative for dysuria. Musculoskeletal: Spasm like back pain.  Skin: Negative for rash. Neurological: Positive HA.    ____________________________________________   PHYSICAL  EXAM:  VITAL SIGNS: ED Triage Vitals  Enc Vitals Group     BP 03/27/23 1453 (!) 154/98     Pulse Rate 03/27/23 1453 94     Resp 03/27/23 1453 18     Temp 03/27/23 1453 98 F (36.7 C)     Temp Source 03/27/23 1453 Oral     SpO2 03/27/23 1453 97 %     Weight 03/27/23 1456 190 lb (86.2 kg)     Height 03/27/23 1456 5\' 7"  (1.702 m)   Constitutional: Alert and oriented. Well appearing and in no acute distress. Eyes: Conjunctivae are normal.  Head: Atraumatic. Nose: No congestion/rhinnorhea. Mouth/Throat: Mucous membranes are moist.  Neck: No stridor.  Cardiovascular: Normal rate, regular rhythm. Good peripheral circulation. Grossly normal heart sounds.   Respiratory: Normal respiratory effort.  No retractions. Lungs CTAB. Gastrointestinal: Soft and nontender. No distention.  Musculoskeletal: No gross deformities of extremities. Neurologic:  Normal speech and language. No gross focal neurologic deficits are appreciated.  Skin:  Skin is warm, dry and intact. No rash noted.  ____________________________________________   LABS (all labs ordered are listed, but only abnormal results are displayed)  Labs Reviewed  BASIC METABOLIC PANEL - Abnormal; Notable for the following components:      Result Value   CO2 20 (*)    Glucose, Bld 222 (*)    All other components within  normal limits  CBC - Abnormal; Notable for the following components:   RBC 5.14 (*)    All other components within normal limits  BRAIN NATRIURETIC PEPTIDE  D-DIMER, QUANTITATIVE  TROPONIN I (HIGH SENSITIVITY)  TROPONIN I (HIGH SENSITIVITY)   ____________________________________________  EKG   EKG Interpretation  Date/Time:  Sunday March 27 2023 14:52:40 EDT Ventricular Rate:  95 PR Interval:  158 QRS Duration: 88 QT Interval:  388 QTC Calculation: 487 R Axis:   42 Text Interpretation: Sinus rhythm with occasional Premature ventricular complexes Nonspecific T wave abnormality Prolonged QT Abnormal ECG  When compared with ECG of 11-Jun-2010 10:50, PREVIOUS ECG IS PRESENT Confirmed by Alona Bene [phone]) on 03/27/2023 4:44:59 PM        ____________________________________________  RADIOLOGY  DG Chest 2 View  Result Date: 03/27/2023 CLINICAL DATA:  Chest pain.  Shortness of breath. EXAM: CHEST - 2 VIEW COMPARISON:  None Available. FINDINGS: The heart size and mediastinal contours are within normal limits. Both lungs are clear. The visualized skeletal structures are unremarkable. IMPRESSION: Negative two view chest x-ray Electronically Signed   By: Marin Roberts M.D.   On: 03/27/2023 15:27    ____________________________________________   PROCEDURES  Procedure(s) performed:   Procedures  None ____________________________________________   INITIAL IMPRESSION / ASSESSMENT AND PLAN / ED COURSE  Pertinent labs & imaging results that were available during my care of the patient were reviewed by me and considered in my medical decision making (see chart for details).   This patient is Presenting for Evaluation of CP, which does require a range of treatment options, and is a complaint that involves a high risk of morbidity and mortality.  The Differential Diagnoses includes but is not exclusive to acute coronary syndrome, aortic dissection, pulmonary embolism, cardiac tamponade, community-acquired pneumonia, pericarditis, musculoskeletal chest wall pain, etc.   Critical Interventions-    Medications  oxyCODONE-acetaminophen (PERCOCET/ROXICET) 5-325 MG per tablet 2 tablet (2 tablets Oral Given 03/27/23 1720)  metoCLOPramide (REGLAN) tablet 10 mg (10 mg Oral Given 03/27/23 1720)    Reassessment after intervention: symptoms improved.    I did obtain Additional Historical Information from husband at bedside.   I decided to review pertinent External Data, and in summary last ECHO was 2015 with EF 50-55%.   Clinical Laboratory Tests Ordered, included troponin normal.  No acute  kidney injury.  Mild hyperglycemia without anion gap.  No anemia.   Radiologic Tests Ordered, included CXR. I independently interpreted the images and agree with radiology interpretation.   Cardiac Monitor Tracing which shows NSR.    Social Determinants of Health Risk patient is a non-smoker.   Medical Decision Making: Summary:  Patient presents to the emergency department for evaluation of chest pain, back spasming pain, abdominal discomfort, nausea.  She notes symptoms are very similar to her fibromyalgia flare type symptoms but the onset was more abrupt and symptoms are more severe prompting ED evaluation.  Initial EKG is reassuring and troponin x 1 is normal.  Does describe some mild shortness of breath and some adding on a D-dimer and will follow second troponin. Will given home pain medications.   Reevaluation with update and discussion with patient and husband. Labs are reassuring. Second troponin negative. D dimer negative. Plan for Cardiology referral and PCP follow up.   Considered admission but workup reassuring. Plan for Cardiology referral for further risk stratification.   Patient's presentation is most consistent with acute presentation with potential threat to life or bodily function.   Disposition:  discharge  ____________________________________________  FINAL CLINICAL IMPRESSION(S) / ED DIAGNOSES  Final diagnoses:  Precordial chest pain   Note:  This document was prepared using Dragon voice recognition software and may include unintentional dictation errors.  Alona Bene, MD, [facility] Emergency Medicine    , Arlyss Repress, MD 03/27/23 [phone]

## 2023-03-27 NOTE — Discharge Instructions (Signed)

## 2023-03-27 NOTE — ED Notes (Signed)
Patient verbalizes understanding of discharge instructions. Opportunity for questioning and answers were provided. Armband removed by staff, pt discharged from ED. Pt ambulatory to ED waiting room with steady gait.  

## 2023-03-27 NOTE — ED Triage Notes (Signed)
Patient arrives ambulatory by POV c/o back pain, shortness of breath and chest spasms onset of 11:30 am while sitting in church. Patient states she has fibromyalgia and experiences pain however states this feel worse. Also reports bilateral ankle swelling x a few months.

## 2023-03-29 ENCOUNTER — Ambulatory Visit: Payer: PPO | Attending: Interventional Cardiology | Admitting: Interventional Cardiology

## 2023-03-29 VITALS — BP 108/62 | HR 78 | Ht 67.0 in | Wt 194.6 lb

## 2023-03-29 DIAGNOSIS — E1159 Type 2 diabetes mellitus with other circulatory complications: Secondary | ICD-10-CM | POA: Diagnosis not present

## 2023-03-29 DIAGNOSIS — I10 Essential (primary) hypertension: Secondary | ICD-10-CM | POA: Diagnosis not present

## 2023-03-29 DIAGNOSIS — R072 Precordial pain: Secondary | ICD-10-CM

## 2023-03-29 DIAGNOSIS — E782 Mixed hyperlipidemia: Secondary | ICD-10-CM

## 2023-03-29 MED ORDER — METOPROLOL TARTRATE 50 MG PO TABS: ORAL_TABLET | ORAL | 0 refills | Status: DC

## 2023-03-29 NOTE — Patient Instructions (Addendum)
Medication Instructions:  Your physician recommends that you continue on your current medications as directed. Please refer to the Current Medication list given to you today.  *If you need a refill on your cardiac medications before your next appointment, please call your pharmacy*   Lab Work: none If you have labs (blood work) drawn today and your tests are completely normal, you will receive your results only by: MyChart Message (if you have MyChart) OR A paper copy in the mail If you have any lab test that is abnormal or we need to change your treatment, we will call you to review the results.   Testing/Procedures: Your physician has requested that you have cardiac CT. Cardiac computed tomography (CT) is a painless test that uses an x-ray machine to take clear, detailed pictures of your heart. For further information please visit [email or web address] Please follow instruction sheet as given.     Follow-Up: At [facility], you and your health needs are our priority.  As part of our continuing mission to provide you with exceptional heart care, we have created designated Provider Care Teams.  These Care Teams include your primary Cardiologist (physician) and Advanced Practice Providers (APPs -  Physician Assistants and Nurse Practitioners) who all work together to provide you with the care you need, when you need it.  We recommend signing up for the patient portal called "MyChart".  Sign up information is provided on this After Visit Summary.  MyChart is used to connect with patients for Virtual Visits (Telemedicine).  Patients are able to view lab/test results, encounter notes, upcoming appointments, etc.  Non-urgent messages can be sent to your provider as well.   To learn more about what you can do with MyChart, go to [email or web address].    Your next appointment:   Based on results  Provider:   Lance Muss, MD     Other Instructions   Your cardiac CT will  be scheduled at one of the below locations:   [facility] [address] [place], [place] [address] [phone]  OR  [facility] [address] [place], [place] [address] [phone]  OR   [facility] [address] [place], [place] [address] [phone]  If scheduled at [facility], please arrive at the [facility] and Children's Entrance (Entrance C2) of [facility] 30 minutes prior to test start time. You can use the FREE valet parking offered at entrance C (encouraged to control the heart rate for the test)  Proceed to the [facility] Radiology Department (first floor) to check-in and test prep.  All radiology patients and guests should use entrance C2 at [facility], accessed from [facility], even though the hospital's physical address listed is [address]    If scheduled at [facility] or [facility], please arrive 15 mins early for check-in and test prep.   Please follow these instructions carefully (unless otherwise directed):   On the Night Before the Test: Be sure to Drink plenty of water. Do not consume any caffeinated/decaffeinated beverages or chocolate 12 hours prior to your test. Do not take any antihistamines 12 hours prior to your test.   On the Day of the Test: Drink plenty of water until 1 hour prior to the test. Do not eat any food 1 hour prior to test. You may take your regular medications prior to the test.  Take metoprolol (Lopressor)  two hours prior to test. If you take Furosemide/Hydrochlorothiazide/Spironolactone, please HOLD on the morning of the test. FEMALES- please wear underwire-free bra if available, avoid dresses & tight clothing        After the Test: Drink plenty of water. After receiving IV contrast, you may experience a mild flushed feeling. This is  normal. On occasion, you may experience a mild rash up to 24 hours after the test. This is not dangerous. If this occurs, you can take Benadryl 25 mg and increase your fluid intake. If you experience trouble breathing, this can be serious. If it is severe call 911 IMMEDIATELY. If it is mild, please call our office. If you take any of these medications: Glipizide/Metformin, Avandament, Glucavance, please do not take 48 hours after completing test unless otherwise instructed.  We will call to schedule your test 2-4 weeks out understanding that some insurance companies will need an authorization prior to the service being performed.   For non-scheduling related questions, please contact the cardiac imaging nurse navigator should you have any questions/concerns: Rockwell Alexandria, Cardiac Imaging Nurse Navigator Larey Brick, Cardiac Imaging Nurse Navigator [facility] Direct Office Dial: [phone]   For scheduling needs, including cancellations and rescheduling, please call [place], [phone].

## 2023-03-29 NOTE — Progress Notes (Signed)
Cardiology Office Note   Date:  03/29/2023   ID:  [name], [name] 26-May-1966, MRN [id number]  PCP:  Thana Ates, MD    No chief complaint on file.  Chest pain  Wt Readings from Last 3 Encounters:  03/29/23 194 lb 9.6 oz (88.3 kg)  03/27/23 190 lb (86.2 kg)  01/08/22 185 lb (83.9 kg)       History of Present Illness: Rhonda Bradley is a 57 y.o. female who is being seen today for the evaluation of chest pain at the request of Long, Arlyss Repress, MD.   She had an episode of chest pain which prompted an ER visit.  Records showed: "past history of chronic fatigue syndrome, fibromyalgia, GERD, hypertension presents to the emergency department with spasming back pain radiating to the chest and jaw.  Symptoms began while at church.  She has had similar symptoms with her fibromyalgia in the past with states this was much more sudden in onset and more severe.  She had associated nausea with headache and vomiting.  Some diaphoresis.  She feels a mild shortness of breath sensation like she needs to take a deep breath. No specific pleuritic pain.  She did take Percocet this morning prior to going to church which is not unusual for her. No fever or chills. "  Still fels fatigued and achy.  Has had fibromyalgia for years.    She felt the sx a few days ago hit her at once.  Vomited once.  Negative troponins.   Father had CABG in his 79s.  Brothers with stents.  Sister without heart issues.    She walks if there is  a high glucose reading.  No regular walking.    Has a pool and does some water exercises in the summer.   Denies : Nitroglycerin use. Orthopnea. Palpitations. Paroxysmal nocturnal dyspnea. Syncope.    Past Medical History:  Diagnosis Date   Anemia    as a child    Anxiety    Asthma    Back pain    Chronic fatigue syndrome    Complication of anesthesia    slow to wake up age 2, but since cholecystectomy and no problems    Constipation    Depression     Diabetes    Family history of anesthesia complication    father slow to wake up    Fatty liver    Fibromyalgia    GERD (gastroesophageal reflux disease)    Gestational diabetes    Headache    High blood pressure    High cholesterol    Hypertension    IBS (irritable bowel syndrome)    Joint pain    Lactose intolerance    Osteopenia    SOB (shortness of breath)     Past Surgical History:  Procedure Laterality Date   BREAST BIOPSY     BREAST EXCISIONAL BIOPSY     CESAREAN SECTION     x 3   CHOLECYSTECTOMY     THYROIDECTOMY N/A 09/19/2014   Procedure: THYROIDECTOMY;  Surgeon: Darnell Level, MD;  Location: [facility];  Service: General;  Laterality: N/A;     Current Outpatient Medications  Medication Sig Dispense Refill   albuterol (PROVENTIL HFA;VENTOLIN HFA) 108 (90 Base) MCG/ACT inhaler Inhale 2 puffs into the lungs every 4 (four) hours as needed for wheezing or shortness of breath. 1 Inhaler 2   albuterol (VENTOLIN HFA) 108 (90 Base) MCG/ACT inhaler Inhale 2 puffs into the  lungs every 4 (four) hours as needed.     Azelastine-Fluticasone 137-50 MCG/ACT SUSP place 2 sprays into both nostrils twice a day nasal 30 days     budesonide-formoterol (SYMBICORT) 160-4.5 MCG/ACT inhaler INHALE TWO PUFFS TWICE DAILY TO PREVENT COUGH OR WHEEZE. RINSE MOUTH AFTER USE. 10.2 Inhaler 0   dicyclomine (BENTYL) 20 MG tablet 1 tablet     escitalopram (LEXAPRO) 20 MG tablet Take 1 tablet (20 mg total) by mouth daily. 90 tablet 3   esomeprazole (NEXIUM) 40 MG capsule Take 40 mg by mouth daily.     estradiol (ESTRACE) 0.1 MG/GM vaginal cream 1 GRAM VAGINAL TWICE WEEKLY 90 DAYS     Eszopiclone 3 MG TABS TAKE 1 TABLET BY MOUTH NIGHTLY IMMEDIATELY BEFORE BEDTIME 30 tablet 2   FARXIGA 5 MG TABS tablet Take 5 mg by mouth daily.     ferrous sulfate 325 (65 FE) MG tablet 1 tablet     furosemide (LASIX) 40 MG tablet Take 80 mg by mouth daily. Patient takes as needed per patient     ibuprofen (ADVIL) 200 MG  tablet 4 tablets with food or milk as needed     irbesartan (AVAPRO) 75 MG tablet Take 37.5 mg by mouth daily.     levothyroxine (SYNTHROID) 75 MCG tablet Take 75 mcg by mouth every morning.     liothyronine (CYTOMEL) 5 MCG tablet TAKE 1 TABLET BY MOUTH TWICE A DAY ON AN EMPTY STOMACH  5   montelukast (SINGULAIR) 10 MG tablet Take 1 tablet (10 mg total) by mouth at bedtime. 30 tablet 5   Oxycodone HCl 10 MG TABS Take 10 mg by mouth every 8 (eight) hours as needed.     OZEMPIC, 1 MG/DOSE, 4 MG/3ML SOPN Inject 1 mg into the skin once a week.     Probiotic CHEW See admin instructions.     rosuvastatin (CRESTOR) 5 MG tablet Take 5 mg by mouth daily.     tiZANidine (ZANAFLEX) 4 MG tablet Take 4 mg by mouth in the morning and at bedtime.     Vitamin D, Ergocalciferol, (DRISDOL) 1.25 MG (50000 UNIT) CAPS capsule Take 1 capsule (50,000 Units total) by mouth every 7 (seven) days. 7 capsule 0   zinc gluconate 50 MG tablet Take 50 mg by mouth daily.     Acetylcysteine (N-ACETYL-L-CYSTEINE PO) Take by mouth. (Patient not taking: Reported on 03/29/2023)     diclofenac (VOLTAREN) 75 MG EC tablet Take 75 mg by mouth 2 (two) times daily. (Patient not taking: Reported on 03/29/2023)     fluticasone (FLONASE) 50 MCG/ACT nasal spray Place 2 sprays into both nostrils 2 (two) times daily. (Patient not taking: Reported on 03/29/2023)     fluticasone (VERAMYST) 27.5 MCG/SPRAY nasal spray Place 2 sprays into the nose daily. (Patient not taking: Reported on 03/29/2023)     furosemide (LASIX) 80 MG tablet TAKE 1 TABLET ALTERNATE WITH 1/2 TABLET EVERY OTHER DAY AS NEEDED     glimepiride (AMARYL) 2 MG tablet Take 2 mg by mouth daily with breakfast. (Patient not taking: Reported on 03/29/2023)     glucosamine-chondroitin 500-400 MG tablet Take 1 tablet by mouth 3 (three) times daily. (Patient not taking: Reported on 03/29/2023)     metFORMIN (GLUCOPHAGE-XR) 500 MG 24 hr tablet Take 500 mg by mouth in the morning and at bedtime.  (Patient not taking: Reported on 03/29/2023)  11   methylphenidate 18 MG PO CR tablet 1 tablet in the morning (Patient not taking: Reported  on 03/29/2023)     YUVAFEM 10 MCG TABS vaginal tablet Place 1 tablet vaginally 2 (two) times a week. (Patient not taking: Reported on 03/29/2023)     No current facility-administered medications for this visit.    Allergies:   5ht3 receptor antagonists, Atorvastatin, Erythromycin, Prednisone, Metronidazole, Other, Penicillins, and Sulfamethoxazole-trimethoprim    Social History:  The patient  reports that she has never smoked. She has never used smokeless tobacco. She reports current alcohol use. She reports that she does not use drugs.   Family History:  The patient's family history includes AAA (abdominal aortic aneurysm) in her father; Alcoholism in her father and mother; Allergic rhinitis in her mother; Angioedema in her maternal grandfather; Anxiety disorder in her mother; Asthma in her mother; Breast cancer in her maternal aunt; Depression in her father and mother; Eczema in her father; Heart disease in her father; High Cholesterol in her father and mother; High blood pressure in her father and mother; Liver disease in her father; Urticaria in her maternal grandfather.    ROS:  Please see the history of present illness.   Otherwise, review of systems are positive for back pain/chest pain.   All other systems are reviewed and negative.    PHYSICAL EXAM: VS:  BP 108/62   Pulse 78   Ht 5\' 7"  (1.702 m)   Wt 194 lb 9.6 oz (88.3 kg)   SpO2 95%   BMI 30.48 kg/m  , BMI Body mass index is 30.48 kg/m. GEN: Well nourished, well developed, in no acute distress HEENT: normal Neck: no JVD, carotid bruits, or masses Cardiac: RRR; no murmurs, rubs, or gallops,no edema  Respiratory:  clear to auscultation bilaterally, normal work of breathing GI: soft, nontender, nondistended, + BS MS: no deformity or atrophy Skin: warm and dry, no rash Neuro:  Strength and  sensation are intact Psych: euthymic mood, full affect   EKG:   The ekg ordered today demonstrates NSR, nonspecific ST changes   Recent Labs: 03/27/2023: B Natriuretic Peptide 16.8; BUN 14; Creatinine, Ser 0.76; Hemoglobin 14.0; Platelets 363; Potassium 3.6; Sodium 135   Lipid Panel    Component Value Date/Time   CHOL 172 07/05/2019 1048   TRIG 124 07/05/2019 1048   HDL 42 07/05/2019 1048   LDLCALC 105 (H) 07/05/2019 1048     Other studies Reviewed: Additional studies/ records that were reviewed today with results demonstrating: ER records reviewed.   ASSESSMENT AND PLAN:  Chest pain: some typical and some atypical features.  Plan for CTA coronaries.   DM: A1c 8.2.  Longterm, will benefit from increased exercise.  Hyperlipidemia: On medicines for 20+ years.  COntinue rosuvastatin.  HTN: The current medical regimen is effective;  continue present plan and medications. Whole food, plant based diet.    Current medicines are reviewed at length with the patient today.  The patient concerns regarding her medicines were addressed.  The following changes have been made:  No change  Labs/ tests ordered today include: CTA coronaries No orders of the defined types were placed in this encounter.   Recommend 150 minutes/week of aerobic exercise Low fat, low carb, high fiber diet recommended  Disposition:   FU based on results   Signed, Lance Muss, MD  03/29/2023 11:45 AM    [facility] [address] [place], [place], [place]  [address] Phone: [phone]; Fax: [phone]

## 2023-03-31 ENCOUNTER — Telehealth (HOSPITAL_COMMUNITY): Payer: Self-pay | Admitting: Emergency Medicine

## 2023-03-31 NOTE — Telephone Encounter (Signed)
Reaching out to patient to offer assistance regarding upcoming cardiac imaging study; pt verbalizes understanding of appt date/time, parking situation and where to check in, pre-test NPO status and medications ordered, and verified current allergies; name and call back number provided for further questions should they arise   RN Navigator Cardiac Imaging [facility] [phone] office [phone] cell 

## 2023-04-01 ENCOUNTER — Ambulatory Visit (HOSPITAL_COMMUNITY)
Admission: RE | Admit: 2023-04-01 | Discharge: 2023-04-01 | Disposition: A | Discharge status: Home / Self Care | Payer: PPO | Source: Ambulatory Visit | Attending: Interventional Cardiology | Admitting: Interventional Cardiology

## 2023-04-01 DIAGNOSIS — R072 Precordial pain: Secondary | ICD-10-CM | POA: Insufficient documentation

## 2023-04-01 MED ORDER — IOHEXOL 350 MG/ML SOLN
95.0000 mL | Freq: Once | INTRAVENOUS | Status: AC | PRN
  Administered 2023-04-01: 95 mL via INTRAVENOUS

## 2023-04-01 MED ORDER — NITROGLYCERIN 0.4 MG SL SUBL
0.8000 mg | SUBLINGUAL_TABLET | Freq: Once | SUBLINGUAL | Status: AC
  Administered 2023-04-01: 0.8 mg via SUBLINGUAL

## 2023-04-01 MED ORDER — NITROGLYCERIN 0.4 MG SL SUBL
SUBLINGUAL_TABLET | SUBLINGUAL | Status: AC
  Filled 2023-04-01: qty 2

## 2023-04-01 NOTE — Progress Notes (Signed)
CT scan completed. Tolerated well. D/C home ambulatory. Awake and alert. In no distress 

## 2023-04-12 ENCOUNTER — Telehealth: Payer: Self-pay | Admitting: Interventional Cardiology

## 2023-04-12 MED ORDER — ROSUVASTATIN CALCIUM 20 MG PO TABS: 20.0000 mg | ORAL_TABLET | Freq: Every day | ORAL | 3 refills | Status: AC

## 2023-04-12 NOTE — Telephone Encounter (Signed)
Spoke with the patient and reviewed results. She states that she saw note in her MyChart and has already increased her Crestor to 20 mg daily. I have sent in a new prescription for her. She states that she has a visit with her PCP in August and they will be checking labs including cholesterol. She will have them forward [place] the results.

## 2023-04-12 NOTE — Telephone Encounter (Signed)
Patient is calling requesting a callback regarding the increase in rosuvastatin due to her CT results seen on her mychart. Please advise.

## 2023-04-12 NOTE — Telephone Encounter (Signed)
-----   Message from Corky Crafts, MD sent at 04/07/2023  7:47 PM EDT ----- No extracardiac findings.  Mild, nonobstructive CAD.  COntinue preventive therapy.  Need to see recent LDL.  WOuld increase rosuvastatin to 20 mg daily.  Recheck liver and lipids in 3 months.  F/u with me in 1 year.

## 2023-04-26 ENCOUNTER — Ambulatory Visit
Admission: RE | Admit: 2023-04-26 | Discharge: 2023-04-26 | Disposition: A | Discharge status: Home / Self Care | Payer: PPO | Source: Ambulatory Visit | Attending: Internal Medicine | Admitting: Internal Medicine

## 2023-04-26 ENCOUNTER — Other Ambulatory Visit: Payer: Self-pay | Admitting: Nurse Practitioner

## 2023-04-26 ENCOUNTER — Other Ambulatory Visit: Payer: Self-pay | Admitting: Internal Medicine

## 2023-04-26 ENCOUNTER — Other Ambulatory Visit (HOSPITAL_COMMUNITY)
Admission: RE | Admit: 2023-04-26 | Discharge: 2023-04-26 | Disposition: A | Discharge status: Home / Self Care | Payer: PPO | Source: Ambulatory Visit | Attending: Nurse Practitioner | Admitting: Nurse Practitioner

## 2023-04-26 DIAGNOSIS — Z1151 Encounter for screening for human papillomavirus (HPV): Secondary | ICD-10-CM | POA: Insufficient documentation

## 2023-04-26 DIAGNOSIS — Z1231 Encounter for screening mammogram for malignant neoplasm of breast: Secondary | ICD-10-CM

## 2023-04-26 DIAGNOSIS — Z01419 Encounter for gynecological examination (general) (routine) without abnormal findings: Secondary | ICD-10-CM | POA: Insufficient documentation

## 2023-04-27 ENCOUNTER — Ambulatory Visit (INDEPENDENT_AMBULATORY_CARE_PROVIDER_SITE_OTHER): Payer: PPO | Admitting: Adult Health

## 2023-04-27 ENCOUNTER — Encounter: Payer: Self-pay | Admitting: Adult Health

## 2023-04-27 DIAGNOSIS — F331 Major depressive disorder, recurrent, moderate: Secondary | ICD-10-CM

## 2023-04-27 DIAGNOSIS — F411 Generalized anxiety disorder: Secondary | ICD-10-CM

## 2023-04-27 DIAGNOSIS — G4700 Insomnia, unspecified: Secondary | ICD-10-CM | POA: Diagnosis not present

## 2023-04-27 DIAGNOSIS — R41840 Attention and concentration deficit: Secondary | ICD-10-CM | POA: Diagnosis not present

## 2023-04-27 MED ORDER — ESCITALOPRAM OXALATE 20 MG PO TABS
20.0000 mg | ORAL_TABLET | Freq: Every day | ORAL | 3 refills | Status: DC
Start: 2023-04-27 — End: 2024-02-24

## 2023-04-27 MED ORDER — ESZOPICLONE 3 MG PO TABS
ORAL_TABLET | ORAL | 2 refills | Status: DC
Start: 2023-04-27 — End: 2023-08-05

## 2023-04-27 NOTE — Progress Notes (Signed)
Rhonda Bradley [id number] June 01, 1966 57 y.o.  Subjective:   Patient ID:  Rhonda Bradley is a 57 y.o. (DOB [birth year]) female.  Chief Complaint: No chief complaint on file.   HPI Rhonda Bradley presents to the office today for follow-up of cognitive attention deficit, GAD, MDD, and insomnia.  Describes mood today as "ok". Pleasant. Tearful at times. Mood symptoms - denies depression and irritability. Reports increased anxiety. Reports possible panic attacks. Denies worry, rumination and over thinking. Mood is consistent. Stating "I feel like I'm doing ok". Feels like medications are helpful. Suffers from chronic pain and fatigue from Fibromyalgia (diagnosed in 2000) - manageable with pain medication. Varying interest and motivation. Taking medications as prescribed.  Energy levels vary. Active, does not have a regular exercise with physical limitations Enjoys some usual interests and activities. Married. Lives with husband. Has 3 children - 2 boys and 1 girl - 7 grandchildren. Family local.   Appetite adequate. Weight gain - 185 to 190 pounds. Sleeps better some nights than others. Averages 6 to 8 hours. Reports decreased napping during the day. Focus and concentration difficulties - having issues with "word recall". Fibromyalgia - reports ongoing cognitive issues "brain fog". Completing tasks. Managing aspects of household. Unable to work - receiving disability benefits since 10/2022. Denies SI or HI.  Denies AH or VH. Denies self harm. Denies substance.  Previous medication trials: Wellbutrin SR, Wellbutrin XL, Cymbalta, Gabapentin   PHQ2-9    Flowsheet Row Office Visit from 01/08/2022 in [facility] Physical Medicine & Rehabilitation Office Visit from 12/10/2021 in [facility] Physical Medicine & Rehabilitation Office Visit from 11/12/2021 in [facility] Physical Medicine & Rehabilitation Office Visit from 09/17/2021 in [facility] Physical Medicine & Rehabilitation Office  Visit from 07/15/2021 in [facility] Physical Medicine & Rehabilitation  PHQ-2 Total Score 2 2 0 4 3  PHQ-9 Total Score -- -- -- -- 35      Flowsheet Row ED from 03/27/2023 in [facility] Emergency Department at [facility]  C-SSRS RISK CATEGORY No Risk        Review of Systems:  Review of Systems  Musculoskeletal:  Negative for gait problem.  Neurological:  Negative for tremors.  Psychiatric/Behavioral:         Please refer to HPI    Medications: I have reviewed the patient's current medications.  Current Outpatient Medications  Medication Sig Dispense Refill   Acetylcysteine (N-ACETYL-L-CYSTEINE PO) Take by mouth. (Patient not taking: Reported on 03/29/2023)     albuterol (PROVENTIL HFA;VENTOLIN HFA) 108 (90 Base) MCG/ACT inhaler Inhale 2 puffs into the lungs every 4 (four) hours as needed for wheezing or shortness of breath. 1 Inhaler 2   albuterol (VENTOLIN HFA) 108 (90 Base) MCG/ACT inhaler Inhale 2 puffs into the lungs every 4 (four) hours as needed.     Azelastine-Fluticasone 137-50 MCG/ACT SUSP place 2 sprays into both nostrils twice a day nasal 30 days     budesonide-formoterol (SYMBICORT) 160-4.5 MCG/ACT inhaler INHALE TWO PUFFS TWICE DAILY TO PREVENT COUGH OR WHEEZE. RINSE MOUTH AFTER USE. 10.2 Inhaler 0   diclofenac (VOLTAREN) 75 MG EC tablet Take 75 mg by mouth 2 (two) times daily. (Patient not taking: Reported on 03/29/2023)     dicyclomine (BENTYL) 20 MG tablet 1 tablet     escitalopram (LEXAPRO) 20 MG tablet Take 1 tablet (20 mg total) by mouth daily. 90 tablet 3   esomeprazole (NEXIUM) 40 MG capsule Take 40 mg by mouth daily.  estradiol (ESTRACE) 0.1 MG/GM vaginal cream 1 GRAM VAGINAL TWICE WEEKLY 90 DAYS     Eszopiclone 3 MG TABS TAKE 1 TABLET BY MOUTH NIGHTLY IMMEDIATELY BEFORE BEDTIME 30 tablet 2   FARXIGA 5 MG TABS tablet Take 5 mg by mouth daily.     ferrous sulfate 325 (65 FE) MG tablet 1 tablet     fluticasone (FLONASE) 50 MCG/ACT nasal spray Place  2 sprays into both nostrils 2 (two) times daily. (Patient not taking: Reported on 03/29/2023)     fluticasone (VERAMYST) 27.5 MCG/SPRAY nasal spray Place 2 sprays into the nose daily. (Patient not taking: Reported on 03/29/2023)     furosemide (LASIX) 40 MG tablet Take 80 mg by mouth daily. Patient takes as needed per patient     furosemide (LASIX) 80 MG tablet TAKE 1 TABLET ALTERNATE WITH 1/2 TABLET EVERY OTHER DAY AS NEEDED     glimepiride (AMARYL) 2 MG tablet Take 2 mg by mouth daily with breakfast. (Patient not taking: Reported on 03/29/2023)     glucosamine-chondroitin 500-400 MG tablet Take 1 tablet by mouth 3 (three) times daily. (Patient not taking: Reported on 03/29/2023)     ibuprofen (ADVIL) 200 MG tablet 4 tablets with food or milk as needed     irbesartan (AVAPRO) 75 MG tablet Take 37.5 mg by mouth daily.     levothyroxine (SYNTHROID) 75 MCG tablet Take 75 mcg by mouth every morning.     liothyronine (CYTOMEL) 5 MCG tablet TAKE 1 TABLET BY MOUTH TWICE A DAY ON AN EMPTY STOMACH  5   metFORMIN (GLUCOPHAGE-XR) 500 MG 24 hr tablet Take 500 mg by mouth in the morning and at bedtime. (Patient not taking: Reported on 03/29/2023)  11   methylphenidate 18 MG PO CR tablet 1 tablet in the morning (Patient not taking: Reported on 03/29/2023)     metoprolol tartrate (LOPRESSOR) 50 MG tablet Take one tablet by mouth 2 hours prior to CT Scan 1 tablet 0   montelukast (SINGULAIR) 10 MG tablet Take 1 tablet (10 mg total) by mouth at bedtime. 30 tablet 5   Oxycodone HCl 10 MG TABS Take 10 mg by mouth every 8 (eight) hours as needed.     OZEMPIC, 1 MG/DOSE, 4 MG/3ML SOPN Inject 1 mg into the skin once a week.     Probiotic CHEW See admin instructions.     rosuvastatin (CRESTOR) 20 MG tablet Take 1 tablet (20 mg total) by mouth daily. 90 tablet 3   tiZANidine (ZANAFLEX) 4 MG tablet Take 4 mg by mouth in the morning and at bedtime.     Vitamin D, Ergocalciferol, (DRISDOL) 1.25 MG (50000 UNIT) CAPS capsule Take 1  capsule (50,000 Units total) by mouth every 7 (seven) days. 7 capsule 0   YUVAFEM 10 MCG TABS vaginal tablet Place 1 tablet vaginally 2 (two) times a week. (Patient not taking: Reported on 03/29/2023)     zinc gluconate 50 MG tablet Take 50 mg by mouth daily.     No current facility-administered medications for this visit.    Medication Side Effects: None  Allergies:  Allergies  Allergen Reactions   5ht3 Receptor Antagonists Other (See Comments)    Other reaction(s): Unknown Other reaction(s): Unknown   Atorvastatin Other (See Comments)    Other reaction(s): muscle pain Other reaction(s): muscle pain   Erythromycin Nausea And Vomiting and Other (See Comments)    Other reaction(s): Upset GI Other reaction(s): Upset GI   Prednisone Other (See Comments)  Metronidazole Rash and Other (See Comments)    Other reaction(s): rash   Other Rash   Penicillins Rash    Other reaction(s): rash Other reaction(s): rash   Sulfamethoxazole-Trimethoprim Rash and Other (See Comments)    Other reaction(s): rash Other reaction(s): rash  Other reaction(s): rash    Past Medical History:  Diagnosis Date   Anemia    as a child    Anxiety    Asthma    Back pain    Chronic fatigue syndrome    Complication of anesthesia    slow to wake up age 60, but since cholecystectomy and no problems    Constipation    Depression    Diabetes (HCC)    Family history of anesthesia complication    father slow to wake up    Fatty liver    Fibromyalgia    GERD (gastroesophageal reflux disease)    Gestational diabetes    Headache    High blood pressure    High cholesterol    Hypertension    IBS (irritable bowel syndrome)    Joint pain    Lactose intolerance    Osteopenia    SOB (shortness of breath)     Past Medical History, Surgical history, Social history, and Family history were reviewed and updated as appropriate.   Please see review of systems for further details on the patient's review from  today.   Objective:   Physical Exam:  There were no vitals taken for this visit.  Physical Exam Constitutional:      General: She is not in acute distress. Musculoskeletal:        General: No deformity.  Neurological:     Mental Status: She is alert and oriented to person, place, and time.     Coordination: Coordination normal.  Psychiatric:        Attention and Perception: Attention and perception normal. She does not perceive auditory or visual hallucinations.        Mood and Affect: Mood normal. Mood is not anxious or depressed. Affect is not labile, blunt, angry or inappropriate.        Speech: Speech normal.        Behavior: Behavior normal.        Thought Content: Thought content normal. Thought content is not paranoid or delusional. Thought content does not include homicidal or suicidal ideation. Thought content does not include homicidal or suicidal plan.        Cognition and Memory: Cognition and memory normal.        Judgment: Judgment normal.     Comments: Insight intact     Lab Review:     Component Value Date/Time   NA 135 03/27/2023 1458   NA 142 07/05/2019 1048   K 3.6 03/27/2023 1458   CL 104 03/27/2023 1458   CO2 20 (L) 03/27/2023 1458   GLUCOSE 222 (H) 03/27/2023 1458   BUN 14 03/27/2023 1458   BUN 12 07/05/2019 1048   CREATININE 0.76 03/27/2023 1458   CALCIUM 9.1 03/27/2023 1458   PROT 7.2 07/05/2019 1048   ALBUMIN 5.1 (H) 07/05/2019 1048   AST 26 07/05/2019 1048   ALT 56 (H) 07/05/2019 1048   ALKPHOS 59 07/05/2019 1048   BILITOT 0.5 07/05/2019 1048   GFRNONAA >60 03/27/2023 1458   GFRAA 116 07/05/2019 1048       Component Value Date/Time   WBC 9.9 03/27/2023 1458   RBC 5.14 (H) 03/27/2023 1458   HGB 14.0 03/27/2023  1458   HGB 14.7 01/30/2019 1808   HCT 43.5 03/27/2023 1458   HCT 43.2 01/30/2019 1808   PLT 363 03/27/2023 1458   PLT 363 01/30/2019 1808   MCV 84.6 03/27/2023 1458   MCV 84 01/30/2019 1808   MCH 27.2 03/27/2023 1458   MCHC  32.2 03/27/2023 1458   RDW 13.8 03/27/2023 1458   RDW 13.3 01/30/2019 1808   LYMPHSABS 3.5 (H) 01/30/2019 1808   MONOABS 0.5 09/05/2010 2335   EOSABS 0.1 01/30/2019 1808   BASOSABS 0.1 01/30/2019 1808    No results found for: "POCLITH", "LITHIUM"   No results found for: "PHENYTOIN", "PHENOBARB", "VALPROATE", "CBMZ"   .res Assessment: Plan:    Plan:  PDMP reviewed  1. Lunesta 3mg  at bedtime for sleep 2. Lexapro 20mg  daily  RTC 6 months  Patient advised to contact office with any questions, adverse effects, or acute worsening in signs and symptoms.  Discussed potential metabolic side effects associated with atypical antipsychotics, as well as potential risk for movement side effects. Advised pt to contact office if movement side effects occur.   Discussed potential benefits, risks, and side effects of stimulants with patient to include increased heart rate, palpitations, insomnia, increased anxiety, increased irritability, or decreased appetite.  Instructed patient to contact office if experiencing any significant tolerability issues.  There are no diagnoses linked to this encounter.   Please see After Visit Summary for patient specific instructions.  Future Appointments  Date Time Provider Department Center  04/27/2023  2:40 PM , Thereasa Solo, NP [facility] None    No orders of the defined types were placed in this encounter.   -------------------------------

## 2023-05-02 LAB — CYTOLOGY - PAP
Comment: NEGATIVE
Diagnosis: NEGATIVE
High risk HPV: NEGATIVE

## 2023-06-16 ENCOUNTER — Telehealth: Payer: Self-pay | Admitting: Interventional Cardiology

## 2023-06-16 NOTE — Telephone Encounter (Signed)
Pt states she got dehydrated and got pressure in her head yesterday. She got blurry vision and was weak. She also mentioned she is having some balance issues. Pt states her bp was 86/55. She states the "episode" lasted about 20-30 minutes. She is wondering if she should f/u with Dr. Eldridge Dace or PCP. Please advise.

## 2023-06-16 NOTE — Telephone Encounter (Signed)
Spoke with patient, she states yesterday she had an episode where she felt weak, her vision was blurry, she felt like she was going to pass out. She felt pressure in her head just above her nose. She went over to her couch to lie down and rest, she states this lasted for approximately 20-30 minutes before subsiding.   She reports she had been feeling dehydrated yesterday, she had been drinking coffee and Coke Zero, some  water. She reports blood sugar was in the 200's, which is higher than usual for her but not terribly high.     Note signed in error before completion.  Continuation from above:  Patient reports BP yesterday was 85/55. Her BP today was 120's/92, HR 60's. She currently denies any dizziness/lightheadedness today. She reports feeling much better.  Advised patient to notify PCP office. Will forward to Dr. Eldridge Dace to review.

## 2023-06-16 NOTE — Telephone Encounter (Signed)
Spoke with patient, she states yesterday she had an episode where she felt weak, her vision was blurry, she felt like she was going to pass out. She felt pressure in her head just above her nose. She went over to her couch to lie down and rest, she states this lasted for approximately 20-30 minutes before subsiding.  She reports she had been feeling dehydrated yesterday, she had been drinking coffee and Coke Zero, some  water. She reports blood sugar was in the 200's, which is higher than usual for her but not terribly high.

## 2023-06-24 NOTE — Telephone Encounter (Signed)
No new recs.  Stay well hydrated.

## 2023-07-01 NOTE — Telephone Encounter (Signed)
Left patient message (OK per DPR) informing her of Dr. Hoyle Barr response to episode she had 2 weeks ago: no new recommendations, stay well hydrated.  Provided office number for callback if any questions.

## 2023-08-04 ENCOUNTER — Telehealth: Payer: Self-pay | Admitting: Adult Health

## 2023-08-04 NOTE — Telephone Encounter (Signed)
Pt LVM @ 10:34a requesting refill of Lexapro and Eszopiclone to   [id number] [place], [place] - [address], [place] [address] Phone: [phone]  Fax: [phone]   Next appt 9/10

## 2023-08-04 NOTE — Telephone Encounter (Signed)
Patient has RF available for Lexapro. Pended Lunesta.

## 2023-08-05 ENCOUNTER — Other Ambulatory Visit: Payer: Self-pay

## 2023-08-05 DIAGNOSIS — G4700 Insomnia, unspecified: Secondary | ICD-10-CM

## 2023-08-05 MED ORDER — ESZOPICLONE 3 MG PO TABS
ORAL_TABLET | ORAL | 2 refills | Status: DC
Start: 2023-08-08 — End: 2023-08-16

## 2023-08-05 NOTE — Telephone Encounter (Signed)
Pended.

## 2023-08-16 ENCOUNTER — Encounter: Payer: Self-pay | Admitting: Adult Health

## 2023-08-16 ENCOUNTER — Telehealth (INDEPENDENT_AMBULATORY_CARE_PROVIDER_SITE_OTHER): Payer: PPO | Admitting: Adult Health

## 2023-08-16 DIAGNOSIS — F411 Generalized anxiety disorder: Secondary | ICD-10-CM

## 2023-08-16 DIAGNOSIS — G4700 Insomnia, unspecified: Secondary | ICD-10-CM

## 2023-08-16 DIAGNOSIS — F331 Major depressive disorder, recurrent, moderate: Secondary | ICD-10-CM

## 2023-08-16 DIAGNOSIS — R41840 Attention and concentration deficit: Secondary | ICD-10-CM

## 2023-08-16 MED ORDER — ESZOPICLONE 3 MG PO TABS
ORAL_TABLET | ORAL | 2 refills | Status: DC
Start: 2023-08-16 — End: 2024-02-21

## 2023-08-16 NOTE — Progress Notes (Signed)
Rhonda Bradley [id number] [birth year] 57 y.o.  Virtual Visit via Video Note  I connected with pt @ on 08/16/23 at  4:20 PM EDT by a video enabled telemedicine application and verified that I am speaking with the correct person using two identifiers.   I discussed the limitations of evaluation and management by telemedicine and the availability of in person appointments. The patient expressed understanding and agreed to proceed.  I discussed the assessment and treatment plan with the patient. The patient was provided an opportunity to ask questions and all were answered. The patient agreed with the plan and demonstrated an understanding of the instructions.   The patient was advised to call back or seek an in-person evaluation if the symptoms worsen or if the condition fails to improve as anticipated.  I provided 25 minutes of non-face-to-face time during this encounter.  The patient was located at home. The provider was located at [facility].   Dorothyann Gibbs, NP   Subjective:   Patient ID:  Rhonda Bradley is a 57 y.o. (DOB 1966/01/04) female.  Chief Complaint: No chief complaint on file.   HPI CAELAN YARGER presents for follow-up of cognitive attention deficit, GAD, MDD, and insomnia.  Describes mood today as "ok". Pleasant. Tearful at times. Mood symptoms - denies depression. Reports anxiety and irritability. Reports panic attacks. Reports worry, rumination and over thinking - "I get in my head" - "trouble letting things good". Mood is variable. Stating "I feel like I'm doing ok". Feels like medications are helpful. Reports chronic pain and fatigue from Fibromyalgia (diagnosed in 2000) - taking pain medication. Varying interest and motivation. Taking medications as prescribed.  Energy levels vary. Active, does not have a regular exercise with physical limitations Enjoys some usual interests and activities. Married. Lives with husband. Has 3 children - 2  boys and 1 girl - 8 grandchildren. Family local.   Appetite adequate. Weight gain - 185 to 190 pounds. Sleeps better some nights than others. Averages 6 to 8 hours. Reports decreased napping during the day. Focus and concentration difficulties. Reports ongoing cognitive issues related to fibromyalgia "brain fog". Completing tasks. Managing aspects of household. Unable to work - receiving disability benefits since 10/2022. Denies SI or HI.  Denies AH or VH. Denies self harm. Denies substance.  Previous medication trials: Wellbutrin SR, Wellbutrin XL, Cymbalta, Gabapentin  Review of Systems:  Review of Systems  Musculoskeletal:  Negative for gait problem.  Neurological:  Negative for tremors.  Psychiatric/Behavioral:         Please refer to HPI    Medications: I have reviewed the patient's current medications.  Current Outpatient Medications  Medication Sig Dispense Refill   Acetylcysteine (N-ACETYL-L-CYSTEINE PO) Take by mouth. (Patient not taking: Reported on 03/29/2023)     albuterol (PROVENTIL HFA;VENTOLIN HFA) 108 (90 Base) MCG/ACT inhaler Inhale 2 puffs into the lungs every 4 (four) hours as needed for wheezing or shortness of breath. 1 Inhaler 2   albuterol (VENTOLIN HFA) 108 (90 Base) MCG/ACT inhaler Inhale 2 puffs into the lungs every 4 (four) hours as needed.     Azelastine-Fluticasone 137-50 MCG/ACT SUSP place 2 sprays into both nostrils twice a day nasal 30 days     budesonide-formoterol (SYMBICORT) 160-4.5 MCG/ACT inhaler INHALE TWO PUFFS TWICE DAILY TO PREVENT COUGH OR WHEEZE. RINSE MOUTH AFTER USE. 10.2 Inhaler 0   diclofenac (VOLTAREN) 75 MG EC tablet Take 75 mg by mouth 2 (two) times daily. (Patient not taking: Reported on 03/29/2023)  dicyclomine (BENTYL) 20 MG tablet 1 tablet     escitalopram (LEXAPRO) 20 MG tablet Take 1 tablet (20 mg total) by mouth daily. 90 tablet 3   esomeprazole (NEXIUM) 40 MG capsule Take 40 mg by mouth daily.     estradiol (ESTRACE) 0.1 MG/GM  vaginal cream 1 GRAM VAGINAL TWICE WEEKLY 90 DAYS     Eszopiclone 3 MG TABS TAKE 1 TABLET BY MOUTH NIGHTLY IMMEDIATELY BEFORE BEDTIME 30 tablet 2   FARXIGA 5 MG TABS tablet Take 5 mg by mouth daily.     ferrous sulfate 325 (65 FE) MG tablet 1 tablet     fluticasone (FLONASE) 50 MCG/ACT nasal spray Place 2 sprays into both nostrils 2 (two) times daily. (Patient not taking: Reported on 03/29/2023)     fluticasone (VERAMYST) 27.5 MCG/SPRAY nasal spray Place 2 sprays into the nose daily. (Patient not taking: Reported on 03/29/2023)     furosemide (LASIX) 40 MG tablet Take 80 mg by mouth daily. Patient takes as needed per patient     furosemide (LASIX) 80 MG tablet TAKE 1 TABLET ALTERNATE WITH 1/2 TABLET EVERY OTHER DAY AS NEEDED     glimepiride (AMARYL) 2 MG tablet Take 2 mg by mouth daily with breakfast. (Patient not taking: Reported on 03/29/2023)     glucosamine-chondroitin 500-400 MG tablet Take 1 tablet by mouth 3 (three) times daily. (Patient not taking: Reported on 03/29/2023)     ibuprofen (ADVIL) 200 MG tablet 4 tablets with food or milk as needed     irbesartan (AVAPRO) 75 MG tablet Take 37.5 mg by mouth daily.     levothyroxine (SYNTHROID) 75 MCG tablet Take 75 mcg by mouth every morning.     liothyronine (CYTOMEL) 5 MCG tablet TAKE 1 TABLET BY MOUTH TWICE A DAY ON AN EMPTY STOMACH  5   metFORMIN (GLUCOPHAGE-XR) 500 MG 24 hr tablet Take 500 mg by mouth in the morning and at bedtime. (Patient not taking: Reported on 03/29/2023)  11   methylphenidate 18 MG PO CR tablet 1 tablet in the morning (Patient not taking: Reported on 03/29/2023)     metoprolol tartrate (LOPRESSOR) 50 MG tablet Take one tablet by mouth 2 hours prior to CT Scan 1 tablet 0   montelukast (SINGULAIR) 10 MG tablet Take 1 tablet (10 mg total) by mouth at bedtime. 30 tablet 5   Oxycodone HCl 10 MG TABS Take 10 mg by mouth every 8 (eight) hours as needed.     OZEMPIC, 1 MG/DOSE, 4 MG/3ML SOPN Inject 1 mg into the skin once a week.      Probiotic CHEW See admin instructions.     rosuvastatin (CRESTOR) 20 MG tablet Take 1 tablet (20 mg total) by mouth daily. 90 tablet 3   tiZANidine (ZANAFLEX) 4 MG tablet Take 4 mg by mouth in the morning and at bedtime.     Vitamin D, Ergocalciferol, (DRISDOL) 1.25 MG (50000 UNIT) CAPS capsule Take 1 capsule (50,000 Units total) by mouth every 7 (seven) days. 7 capsule 0   YUVAFEM 10 MCG TABS vaginal tablet Place 1 tablet vaginally 2 (two) times a week. (Patient not taking: Reported on 03/29/2023)     zinc gluconate 50 MG tablet Take 50 mg by mouth daily.     No current facility-administered medications for this visit.    Medication Side Effects: None  Allergies:  Allergies  Allergen Reactions   5ht3 Receptor Antagonists Other (See Comments)    Other reaction(s): Unknown Other reaction(s): Unknown  Atorvastatin Other (See Comments)    Other reaction(s): muscle pain Other reaction(s): muscle pain   Erythromycin Nausea And Vomiting and Other (See Comments)    Other reaction(s): Upset GI Other reaction(s): Upset GI   Prednisone Other (See Comments)   Metronidazole Rash and Other (See Comments)    Other reaction(s): rash   Other Rash   Penicillins Rash    Other reaction(s): rash Other reaction(s): rash   Sulfamethoxazole-Trimethoprim Rash and Other (See Comments)    Other reaction(s): rash Other reaction(s): rash  Other reaction(s): rash    Past Medical History:  Diagnosis Date   Anemia    as a child    Anxiety    Asthma    Back pain    Chronic fatigue syndrome    Complication of anesthesia    slow to wake up age 70, but since cholecystectomy and no problems    Constipation    Depression    Diabetes (HCC)    Family history of anesthesia complication    father slow to wake up    Fatty liver    Fibromyalgia    GERD (gastroesophageal reflux disease)    Gestational diabetes    Headache    High blood pressure    High cholesterol    Hypertension    IBS  (irritable bowel syndrome)    Joint pain    Lactose intolerance    Osteopenia    SOB (shortness of breath)     Family History  Problem Relation Age of Onset   Breast cancer Maternal Aunt    Allergic rhinitis Mother    Asthma Mother    High blood pressure Mother    High Cholesterol Mother    Depression Mother    Anxiety disorder Mother    Alcoholism Mother    Eczema Father    High blood pressure Father    High Cholesterol Father    Heart disease Father    Depression Father    Liver disease Father    Alcoholism Father    AAA (abdominal aortic aneurysm) Father    Angioedema Maternal Grandfather    Urticaria Maternal Grandfather     Social History   Socioeconomic History   Marital status: Married    Spouse name: Optometrist   Number of children: Not on file   Years of education: Not on file   Highest education level: Not on file  Occupational History   Occupation: Therapist, [employer]  Tobacco Use   Smoking status: Never   Smokeless tobacco: Never  Vaping Use   Vaping status: Never Used  Substance and Sexual Activity   Alcohol use: Yes    Comment: socially    Drug use: No   Sexual activity: Not on file  Other Topics Concern   Not on file  Social History Narrative   Not on file   Social Determinants of Health   Financial Resource Strain: Not on file  Food Insecurity: Not on file  Transportation Needs: Not on file  Physical Activity: Not on file  Stress: Not on file  Social Connections: Not on file  Intimate Partner Violence: Not on file    Past Medical History, Surgical history, Social history, and Family history were reviewed and updated as appropriate.   Please see review of systems for further details on the patient's review from today.   Objective:   Physical Exam:  There were no vitals taken for this visit.  Physical Exam Constitutional:  General: She is not in acute distress. Musculoskeletal:        General: No  deformity.  Neurological:     Mental Status: She is alert and oriented to person, place, and time.     Coordination: Coordination normal.  Psychiatric:        Attention and Perception: Attention and perception normal. She does not perceive auditory or visual hallucinations.        Mood and Affect: Affect is not labile, blunt, angry or inappropriate.        Speech: Speech normal.        Behavior: Behavior normal.        Thought Content: Thought content normal. Thought content is not paranoid or delusional. Thought content does not include homicidal or suicidal ideation. Thought content does not include homicidal or suicidal plan.        Cognition and Memory: Cognition and memory normal.        Judgment: Judgment normal.     Comments: Insight intact     Lab Review:     Component Value Date/Time   NA 135 03/27/2023 1458   NA 142 07/05/2019 1048   K 3.6 03/27/2023 1458   CL 104 03/27/2023 1458   CO2 20 (L) 03/27/2023 1458   GLUCOSE 222 (H) 03/27/2023 1458   BUN 14 03/27/2023 1458   BUN 12 07/05/2019 1048   CREATININE 0.76 03/27/2023 1458   CALCIUM 9.1 03/27/2023 1458   PROT 7.2 07/05/2019 1048   ALBUMIN 5.1 (H) 07/05/2019 1048   AST 26 07/05/2019 1048   ALT 56 (H) 07/05/2019 1048   ALKPHOS 59 07/05/2019 1048   BILITOT 0.5 07/05/2019 1048   GFRNONAA >60 03/27/2023 1458   GFRAA 116 07/05/2019 1048       Component Value Date/Time   WBC 9.9 03/27/2023 1458   RBC 5.14 (H) 03/27/2023 1458   HGB 14.0 03/27/2023 1458   HGB 14.7 01/30/2019 1808   HCT 43.5 03/27/2023 1458   HCT 43.2 01/30/2019 1808   PLT 363 03/27/2023 1458   PLT 363 01/30/2019 1808   MCV 84.6 03/27/2023 1458   MCV 84 01/30/2019 1808   MCH 27.2 03/27/2023 1458   MCHC 32.2 03/27/2023 1458   RDW 13.8 03/27/2023 1458   RDW 13.3 01/30/2019 1808   LYMPHSABS 3.5 (H) 01/30/2019 1808   MONOABS 0.5 09/05/2010 2335   EOSABS 0.1 01/30/2019 1808   BASOSABS 0.1 01/30/2019 1808    No results found for: "POCLITH",  "LITHIUM"   No results found for: "PHENYTOIN", "PHENOBARB", "VALPROATE", "CBMZ"   .res Assessment: Plan:    Plan:  PDMP reviewed  1. Lunesta 3mg  at bedtime for sleep 2. Lexapro 20mg  daily  RTC 6 months  Patient advised to contact office with any questions, adverse effects, or acute worsening in signs and symptoms.  Discussed potential metabolic side effects associated with atypical antipsychotics, as well as potential risk for movement side effects. Advised pt to contact office if movement side effects occur.   Discussed potential benefits, risks, and side effects of stimulants with patient to include increased heart rate, palpitations, insomnia, increased anxiety, increased irritability, or decreased appetite.  Instructed patient to contact office if experiencing any significant tolerability issues.  Diagnoses and all orders for this visit:  Major depressive disorder, recurrent episode, moderate (HCC)  Insomnia, unspecified type -     Eszopiclone 3 MG TABS; TAKE 1 TABLET BY MOUTH NIGHTLY IMMEDIATELY BEFORE BEDTIME  Generalized anxiety disorder  Cognitive attention deficit  Please see After Visit Summary for patient specific instructions.  No future appointments.   No orders of the defined types were placed in this encounter.     -------------------------------

## 2023-11-25 ENCOUNTER — Other Ambulatory Visit: Payer: Self-pay | Admitting: Internal Medicine

## 2023-11-25 DIAGNOSIS — R109 Unspecified abdominal pain: Secondary | ICD-10-CM

## 2023-11-25 LAB — LAB REPORT - SCANNED: EGFR: 100

## 2023-12-15 ENCOUNTER — Ambulatory Visit
Admission: RE | Admit: 2023-12-15 | Discharge: 2023-12-15 | Disposition: A | Discharge status: Home / Self Care | Payer: PPO | Source: Ambulatory Visit | Attending: Internal Medicine | Admitting: Internal Medicine

## 2023-12-15 DIAGNOSIS — R109 Unspecified abdominal pain: Secondary | ICD-10-CM

## 2023-12-15 MED ORDER — IOPAMIDOL (ISOVUE-300) INJECTION 61%
100.0000 mL | Freq: Once | INTRAVENOUS | Status: AC | PRN
  Administered 2023-12-15: 100 mL via INTRAVENOUS

## 2024-01-16 ENCOUNTER — Encounter: Payer: Self-pay | Admitting: Physical Medicine and Rehabilitation

## 2024-01-16 ENCOUNTER — Encounter: Payer: PPO | Attending: Physical Medicine and Rehabilitation | Admitting: Physical Medicine and Rehabilitation

## 2024-01-16 VITALS — BP 106/71 | HR 63 | Ht 67.0 in | Wt 196.0 lb

## 2024-01-16 DIAGNOSIS — E1169 Type 2 diabetes mellitus with other specified complication: Secondary | ICD-10-CM | POA: Insufficient documentation

## 2024-01-16 DIAGNOSIS — M797 Fibromyalgia: Secondary | ICD-10-CM | POA: Diagnosis not present

## 2024-01-16 DIAGNOSIS — G894 Chronic pain syndrome: Secondary | ICD-10-CM | POA: Diagnosis not present

## 2024-01-16 DIAGNOSIS — Z79899 Other long term (current) drug therapy: Secondary | ICD-10-CM | POA: Diagnosis not present

## 2024-01-16 DIAGNOSIS — G43801 Other migraine, not intractable, with status migrainosus: Secondary | ICD-10-CM | POA: Insufficient documentation

## 2024-01-16 DIAGNOSIS — Z5181 Encounter for therapeutic drug level monitoring: Secondary | ICD-10-CM | POA: Diagnosis not present

## 2024-01-16 DIAGNOSIS — T730XXS Starvation, sequela: Secondary | ICD-10-CM | POA: Diagnosis not present

## 2024-01-16 DIAGNOSIS — Z794 Long term (current) use of insulin: Secondary | ICD-10-CM

## 2024-01-16 MED ORDER — OXYCODONE HCL 10 MG PO TABS: 10.0000 mg | ORAL_TABLET | ORAL | 0 refills | Status: DC | PRN

## 2024-01-16 NOTE — Addendum Note (Signed)
 Addended by: Kyrin Gratz M on: 01/16/2024 03:30 PM   Modules accepted: Orders

## 2024-01-16 NOTE — Progress Notes (Signed)
 Subjective:    Patient ID: Rhonda Bradley, female    DOB: 02/17/1966, 58 y.o.   MRN: [id number]  HPI Rhonda Bradley is a 58 year old woman who presents for follow-up of fibromyalgia, chronic fatigue, Hashimoto's.  1) Fibromyalgia: -she has been having more pain -she is taking oxycodone  three times per day -she was told she can take up to 4,000mg  extra strength tylenol  per day but she tries not to -when she called to make this appointment she was having an infection -she has had pain for many years, and it makes her miserable -she has had benefit from deep tissue massage. 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  -she loves to swim and this helps her  -heat, sometimes ice -she is prescribed diclofenac and oxycodone  and she tries to take these sparingly.  -Has tried Savella, Lyrica , gabapentin, Cymbalta, Seroquel , amitriptyline  without benefit or had side effect. -average pain is 8/10 -had episode of palpitations, and has felt at times like she is going to fall -she takes furosemide for lower extremity leg swelling.  -she used to be an exercise fanatic years ago and she does not know if this has contributed to a herniated discs. -pain worsens with the cold weather -feeling more pan today with the gloomy weather -last time she was here she mentioned to [facility] about knots in her neck.  -she gets myofascial pain in her neck and upper back, her legs, her shoulder blades bilaterally.  -worst on her left side.  -husband afraid of hurting her.  -she sometimes gets tightness to wear she can't move her neck.    2) Type 2 diabetes: -was started on insulin and metformin and other medications were stopped  Pain Inventory Average Pain 8 Pain Right Now 8 My pain is constant, sharp, burning, stabbing, tingling, and aching  In the last 24 hours, has pain interfered with the following? General activity 6 Relation with others 10 Enjoyment of life 5 What TIME of day is your pain at  its worst? morning , daytime, evening, and night Sleep (in general) Fair  Pain is worse with: walking, bending, sitting, inactivity, standing, and some activites Pain improves with: rest, heat/ice, pacing activities, and medication Relief from Meds: 8     Family History  Problem Relation Age of Onset   Breast cancer Maternal Aunt    Allergic rhinitis Mother    Asthma Mother    High blood pressure Mother    High Cholesterol Mother    Depression Mother    Anxiety disorder Mother    Alcoholism Mother    Eczema Father    High blood pressure Father    High Cholesterol Father    Heart disease Father    Depression Father    Liver disease Father    Alcoholism Father    AAA (abdominal aortic aneurysm) Father    Angioedema Maternal Grandfather    Urticaria Maternal Grandfather    Social History   Socioeconomic History   Marital status: Married    Spouse name: Optometrist   Number of children: Not on file   Years of education: Not on file   Highest education level: Not on file  Occupational History   Occupation: Therapist, [employer]  Tobacco Use   Smoking status: Never   Smokeless tobacco: Never  Vaping Use   Vaping status: Never Used  Substance and Sexual Activity   Alcohol use: Yes    Comment: socially  Drug use: No   Sexual activity: Not on file  Other Topics Concern   Not on file  Social History Narrative   Not on file   Social Drivers of Health   Financial Resource Strain: Not on file  Food Insecurity: Low Risk  (08/31/2023)   Received from [facility]   Hunger Vital Sign    Worried About Running Out of Food in the Last Year: Never true    Ran Out of Food in the Last Year: Never true  Transportation Needs: No Transportation Needs (08/31/2023)   Received from [facility]    In the past 12 months, has lack of reliable transportation kept you from medical appointments, meetings, work or from getting things needed for  daily living? : No  Physical Activity: Not on file  Stress: Not on file  Social Connections: Not on file   Past Surgical History:  Procedure Laterality Date   BREAST BIOPSY     BREAST EXCISIONAL BIOPSY     CESAREAN SECTION     x 3   CHOLECYSTECTOMY     THYROIDECTOMY N/A 09/19/2014   Procedure: THYROIDECTOMY;  Surgeon: Oralee Billow, MD;  Location: [facility];  Service: General;  Laterality: N/A;   Past Medical History:  Diagnosis Date   Anemia    as a child    Anxiety    Asthma    Back pain    Chronic fatigue syndrome    Complication of anesthesia    slow to wake up age 58, but since cholecystectomy and no problems    Constipation    Depression    Diabetes (HCC)    Family history of anesthesia complication    father slow to wake up    Fatty liver    Fibromyalgia    GERD (gastroesophageal reflux disease)    Gestational diabetes    Headache    High blood pressure    High cholesterol    Hypertension    IBS (irritable bowel syndrome)    Joint pain    Lactose intolerance    Osteopenia    SOB (shortness of breath)    There were no vitals taken for this visit.  Opioid Risk Score:   Fall Risk Score:  `1  Depression screen PHQ 2/9     01/08/2022    2:21 PM 01/08/2022    2:20 PM 12/10/2021   11:10 AM 11/12/2021   11:16 AM 09/17/2021    3:12 PM 07/15/2021   11:12 AM 07/05/2019    7:29 AM  Depression screen PHQ 2/9  Decreased Interest 1 0 1 0 1 2 1   Down, Depressed, Hopeless 1 0 1 0 3 1 1   PHQ - 2 Score 2 0 2 0 4 3 2   Altered sleeping      3 0  Tired, decreased energy      3 1  Change in appetite      3 3  Feeling bad or failure about yourself       2 0  Trouble concentrating      1 3  Moving slowly or fidgety/restless      3 0  Suicidal thoughts      0 0  PHQ-9 Score      18 9  Difficult doing work/chores      Extremely dIfficult Extremely dIfficult       Review of Systems  Constitutional:  Negative for unexpected weight change.  HENT: Negative.  Eyes:  Negative.   Respiratory:  Negative for cough and wheezing.   Cardiovascular: Negative.   Gastrointestinal:  Negative for constipation, diarrhea and nausea.  Endocrine: Negative.   Genitourinary: Negative.   Musculoskeletal:  Positive for back pain, gait problem and neck pain.       Pain in riht and left leg , pain in both arms,   Skin: Negative.   Allergic/Immunologic: Negative.   Neurological:  Positive for weakness and headaches. Negative for numbness.  Hematological: Negative.   Psychiatric/Behavioral:  Positive for dysphoric mood. Negative for suicidal ideas. The patient is not nervous/anxious.        Anxiety      Objective:   Physical Exam Gen: no distress, normal appearing, weight 196 lbs, BMI 30.70, BP 106/71 HEENT: oral mucosa pink and moist, NCAT Cardio: Reg rate Chest: normal effort, normal rate of breathing Abd: soft, non-distended Ext: no edema Psych: pleasant, normal affect Skin: intact Neuro: Alert and oriented x3 Musculoskeletal: Diffuse pain on both sides of her body and in multiple joints. Tight cervical myofascia. Stable 2.10     Assessment & Plan:   1) Fibromyalgia: There is presence of widespread pain above and below the waist on both sides for more than 3 months that is consistent with fibromyalgia. There are related symptoms of fatigue, cognitive dysfunction, unrefreshing sleep, anxiety, and depression. Educated that both genetics and environment can impact fibromyalgia. -reviewed her current medications -continue oxycodone  10mg  q4H prn Discussed that massage could be helpful -discussed tens unit Prescribing Home Zynex NexWave Stimulator Device and supplies as needed. IFC, NMES and TENS medically necessary Treatment Rx: Daily @ 30-40 minutes per treatment PRN. Zynex NexWave only, no substitutions. Treatment Goals: 1) To reduce and/or eliminate pain 2) To improve functional capacity and Activities of daily living 3) To reduce or prevent the need for oral  medications 4) To improve circulation in the injured region 5) To decrease or prevent muscle spasm and muscle atrophy 6) To provide a self-management tool to the patient The patient has not sufficiently improved with conservative care. Numerous studies indexed by Medline and PubMed.gov have shown Neuromuscular, Interferential, and TENS stimulators to reduce pain, improve function, and reduce medication use in injured patients. Continued use of this evidence based, safe, drug free treatment is both reasonable and medically necessary at this time.  -discussed the importance of exercise -UDS obtained and contains expected metabolites. Discussed risks and benefits of oxycodone  and prescribed Percocet 5mg  BID PRN. Repeat UDS today.  -discussed side effects of NSAIDs.  -discussed side effects of oxycodone , increase to TID PRN.  -Bacopa recommended.  -recommended taking NAC 600mg  BID.  -continued massage, discussed ts other benefits.  -Discussed current symptoms of pain and history of pain.  -Discussed benefits of exercise in reducing pain. -Discussed following foods that may reduce pain: 1) Ginger (especially studied for arthritis)- reduce leukotriene production to decrease inflammation 2) Blueberries- high in phytonutrients that decrease inflammation 3) Salmon- marine omega-3s reduce joint swelling and pain 4) Pumpkin seeds- reduce inflammation 5) dark chocolate- reduces inflammation 6) turmeric- reduces inflammation 7) tart cherries - reduce pain and stiffness 8) extra virgin olive oil - its compound olecanthal helps to block prostaglandins  9) chili peppers- can be eaten or applied topically via capsaicin 10) mint- helpful for headache, muscle aches, joint pain, and itching 11) garlic- reduces inflammation  Link to further information on diet for chronic pain: [email or web address]   2) Stomach pain- has been diagnosed with  IBS.  -advised against  using the premier protein.   3) groin pain: -radiating from low back.   4) chronic cough -low dose prednisone helped for some time.   5) Cervical myofascial pain syndrome -referred for therapy for dry needling,  -trigger point injections.   6) Type 2 diabetes: -continue insulin -continue nuts, salmon, fish, chicken -check CBGs daily, log, and bring log to follow-up appointment -avoid sugar, bread, pasta, rice -avoid snacking -perform daily foot exam and at least annual eye exam -try to incorporate into your diet some of the following foods which are good for diabetes: 1) cinnamon- imitates effects of insulin, increasing glucose transport into cells ([place] or [place] cinnamon is best, least processed) 2) nuts- can slow down the blood sugar response of carbohydrate rich foods 3) oatmeal- contains and anti-inflammatory compound avenanthramide 4) whole-milk yogurt (best types are no sugar, [place] yogurt, or goat/sheep yogurt) 5) beans- high in protein, fiber, and vitamins, low glycemic index 6) broccoli- great source of vitamin A and C 7) quinoa- higher in protein and fiber than other grains 8) spinach- high in vitamin A, fiber, and protein 9) olive oil- reduces glucose levels, LDL, and triglycerides 10) salmon- excellent amount of omega-3-fatty acids 11) walnuts- rich in antioxidants 12) apples- high in fiber and quercetin 13) carrots- highly nutritious with low impact on blood sugar 14) eggs- improve HDL (good cholesterol), high in protein, keep you satiated 15) turmeric: improves blood sugars, cardiovascular disease, and protects kidney health 16) garlic: improves blood sugar, blood pressure, pain 17) tomatoes: highly nutritious with low impact on blood sugar   8) Migraines: -encouraged avoiding gluten  9) Hungry: -encouraged drinking tea between the meals

## 2024-01-16 NOTE — Patient Instructions (Signed)
 Diabetes: -check CBGs daily, log, and bring log to follow-up appointment -avoid sugar, bread, pasta, rice -avoid snacking -perform daily foot exam and at least annual eye exam -try to incorporate into your diet some of the following foods which are good for diabetes: 1) cinnamon- imitates effects of insulin, increasing glucose transport into cells ([place] or [place] cinnamon is best, least processed) 2) nuts- can slow down the blood sugar response of carbohydrate rich foods 3) oatmeal- contains and anti-inflammatory compound avenanthramide 4) whole-milk yogurt (best types are no sugar, [place] yogurt, or goat/sheep yogurt) 5) beans- high in protein, fiber, and vitamins, low glycemic index 6) broccoli- great source of vitamin A and C 7) quinoa- higher in protein and fiber than other grains 8) spinach- high in vitamin A, fiber, and protein 9) olive oil- reduces glucose levels, LDL, and triglycerides 10) salmon- excellent amount of omega-3-fatty acids 11) walnuts- rich in antioxidants 12) apples- high in fiber and quercetin 13) carrots- highly nutritious with low impact on blood sugar 14) eggs- improve HDL (good cholesterol), high in protein, keep you satiated 15) turmeric: improves blood sugars, cardiovascular disease, and protects kidney health 16) garlic: improves blood sugar, blood pressure, pain 17) tomatoes: highly nutritious with low impact on blood sugar   Foods that may reduce pain: 1) Ginger (especially studied for arthritis)- reduce leukotriene production to decrease inflammation 2) Blueberries- high in phytonutrients that decrease inflammation 3) Salmon- marine omega-3s reduce joint swelling and pain 4) Pumpkin seeds- reduce inflammation 5) dark chocolate- reduces inflammation 6) turmeric- reduces inflammation 7) tart cherries - reduce pain and stiffness 8) extra virgin olive oil - its compound olecanthal helps to block prostaglandins  9) chili peppers- can be eaten or  applied topically via capsaicin 10) mint- helpful for headache, muscle aches, joint pain, and itching 11) garlic- reduces inflammation  Link to further information on diet for chronic pain: [email or web address]

## 2024-01-19 LAB — TOXASSURE SELECT,+ANTIDEPR,UR

## 2024-02-11 DIAGNOSIS — J209 Acute bronchitis, unspecified: Secondary | ICD-10-CM | POA: Diagnosis not present

## 2024-02-11 DIAGNOSIS — Z8709 Personal history of other diseases of the respiratory system: Secondary | ICD-10-CM | POA: Diagnosis not present

## 2024-02-11 DIAGNOSIS — R051 Acute cough: Secondary | ICD-10-CM | POA: Diagnosis not present

## 2024-02-11 DIAGNOSIS — R509 Fever, unspecified: Secondary | ICD-10-CM | POA: Diagnosis not present

## 2024-02-11 DIAGNOSIS — J101 Influenza due to other identified influenza virus with other respiratory manifestations: Secondary | ICD-10-CM | POA: Diagnosis not present

## 2024-02-20 ENCOUNTER — Telehealth: Payer: Self-pay | Admitting: Adult Health

## 2024-02-20 NOTE — Telephone Encounter (Addendum)
 Lunesta LF 2/18. Has RF on Lexapro.

## 2024-02-20 NOTE — Telephone Encounter (Signed)
 Rhonda Bradley called at 1:10 to make an appt which is 3/21, and she requested refills of her Lunesta and Lexapro.  Send to  [id number] - [place], [place] - [address]

## 2024-02-21 ENCOUNTER — Other Ambulatory Visit: Payer: Self-pay

## 2024-02-21 DIAGNOSIS — G4700 Insomnia, unspecified: Secondary | ICD-10-CM

## 2024-02-21 MED ORDER — ESZOPICLONE 3 MG PO TABS
ORAL_TABLET | ORAL | 0 refills | Status: DC
Start: 2024-02-21 — End: 2024-02-24

## 2024-02-21 NOTE — Telephone Encounter (Signed)
 Pended [facility] to the [facility]

## 2024-02-23 ENCOUNTER — Other Ambulatory Visit: Payer: Self-pay | Admitting: Physical Medicine and Rehabilitation

## 2024-02-24 ENCOUNTER — Encounter: Payer: Self-pay | Admitting: Adult Health

## 2024-02-24 ENCOUNTER — Telehealth: Admitting: Adult Health

## 2024-02-24 DIAGNOSIS — F331 Major depressive disorder, recurrent, moderate: Secondary | ICD-10-CM

## 2024-02-24 DIAGNOSIS — G4700 Insomnia, unspecified: Secondary | ICD-10-CM | POA: Diagnosis not present

## 2024-02-24 DIAGNOSIS — F411 Generalized anxiety disorder: Secondary | ICD-10-CM

## 2024-02-24 DIAGNOSIS — R41840 Attention and concentration deficit: Secondary | ICD-10-CM

## 2024-02-24 MED ORDER — ESCITALOPRAM OXALATE 20 MG PO TABS
20.0000 mg | ORAL_TABLET | Freq: Every day | ORAL | 3 refills | Status: AC
Start: 2024-02-24 — End: ?

## 2024-02-24 MED ORDER — ESZOPICLONE 3 MG PO TABS
ORAL_TABLET | ORAL | 2 refills | Status: DC
Start: 2024-02-24 — End: 2024-06-25

## 2024-02-24 MED ORDER — OXYCODONE HCL 10 MG PO TABS: 10.0000 mg | ORAL_TABLET | ORAL | 0 refills | Status: DC | PRN

## 2024-02-24 NOTE — Progress Notes (Signed)
 Rhonda Bradley [id number] [birth year] 58 y.o.  Virtual Visit via Video Note  I connected with pt @ on 02/24/24 at  4:30 PM EDT by a video enabled telemedicine application and verified that I am speaking with the correct person using two identifiers.   I discussed the limitations of evaluation and management by telemedicine and the availability of in person appointments. The patient expressed understanding and agreed to proceed.  I discussed the assessment and treatment plan with the patient. The patient was provided an opportunity to ask questions and all were answered. The patient agreed with the plan and demonstrated an understanding of the instructions.   The patient was advised to call back or seek an in-person evaluation if the symptoms worsen or if the condition fails to improve as anticipated.  I provided 25 minutes of non-face-to-face time during this encounter.  The patient was located at home.  The provider was located at [facility].   Dorothyann Gibbs, NP   Subjective:   Patient ID:  Rhonda Bradley is a 58 y.o. (DOB 1966-03-23) female.  Chief Complaint: No chief complaint on file.   HPI Rhonda Bradley presents for follow-up of cognitive attention deficit, GAD, MDD, and insomnia.  Describes mood today as "ok". Pleasant. Tearful at times. Mood symptoms - denies depression. Reports varying interest and motivation.Reports anxiety and irritability. Denies panic attacks. Reports worry, rumination and over thinking. Mood is variable. Stating "I feel like I'm doing alright - as good as I can  with being in chronic pain". Reports chronic pain and fatigue from Fibromyalgia (diagnosed in 2000) - taking pain medication. Feels like medications are helpful. Taking medications as prescribed.  Energy levels vary. Active, does not have a regular exercise with physical limitations Enjoys some usual interests and activities. Married. Lives with husband. Has 3 children -  2 boys and 1 girl - 8 grandchildren. Family local.   Appetite adequate. Weight gain - 190 to 195 pounds. Sleeps better some nights than others. Averages 6 to 8 hours. Reports decreased napping during the day. Focus and concentration difficulties. Reports ongoing cognitive issues related to fibromyalgia "brain fog". Completing tasks. Managing aspects of household. Unable to work - receiving disability benefits since 10/2022. Denies SI or HI.  Denies AH or VH. Denies self harm. Denies substance.  Previous medication trials: Wellbutrin SR, Wellbutrin XL, Cymbalta, Gabapentin  Review of Systems:  Review of Systems  Musculoskeletal:  Negative for gait problem.  Neurological:  Negative for tremors.  Psychiatric/Behavioral:         Please refer to HPI   Medications: I have reviewed the patient's current medications.  Current Outpatient Medications  Medication Sig Dispense Refill   Acetylcysteine (N-ACETYL-L-CYSTEINE PO) Take by mouth.     albuterol (PROVENTIL HFA;VENTOLIN HFA) 108 (90 Base) MCG/ACT inhaler Inhale 2 puffs into the lungs every 4 (four) hours as needed for wheezing or shortness of breath. 1 Inhaler 2   albuterol (VENTOLIN HFA) 108 (90 Base) MCG/ACT inhaler Inhale 2 puffs into the lungs every 4 (four) hours as needed.     Azelastine-Fluticasone 137-50 MCG/ACT SUSP place 2 sprays into both nostrils twice a day nasal 30 days     budesonide-formoterol (SYMBICORT) 160-4.5 MCG/ACT inhaler INHALE TWO PUFFS TWICE DAILY TO PREVENT COUGH OR WHEEZE. RINSE MOUTH AFTER USE. 10.2 Inhaler 0   diclofenac (VOLTAREN) 75 MG EC tablet Take 75 mg by mouth 2 (two) times daily.     dicyclomine (BENTYL) 20 MG tablet 1 tablet  escitalopram (LEXAPRO) 20 MG tablet Take 1 tablet (20 mg total) by mouth daily. 90 tablet 3   esomeprazole (NEXIUM) 40 MG capsule Take 40 mg by mouth daily.     estradiol (ESTRACE) 0.1 MG/GM vaginal cream 1 GRAM VAGINAL TWICE WEEKLY 90 DAYS     Eszopiclone 3 MG TABS TAKE 1  TABLET BY MOUTH NIGHTLY IMMEDIATELY BEFORE BEDTIME 30 tablet 2   FARXIGA 5 MG TABS tablet Take 5 mg by mouth daily.     ferrous sulfate 325 (65 FE) MG tablet 1 tablet     fluticasone (FLONASE) 50 MCG/ACT nasal spray Place 2 sprays into both nostrils 2 (two) times daily.     fluticasone (VERAMYST) 27.5 MCG/SPRAY nasal spray Place 2 sprays into the nose daily.     furosemide (LASIX) 40 MG tablet Take 80 mg by mouth daily. Patient takes as needed per patient     furosemide (LASIX) 80 MG tablet TAKE 1 TABLET ALTERNATE WITH 1/2 TABLET EVERY OTHER DAY AS NEEDED     glimepiride (AMARYL) 2 MG tablet Take 2 mg by mouth daily with breakfast.     glucosamine-chondroitin 500-400 MG tablet Take 1 tablet by mouth 3 (three) times daily.     ibuprofen (ADVIL) 200 MG tablet 4 tablets with food or milk as needed     irbesartan (AVAPRO) 75 MG tablet Take 37.5 mg by mouth daily.     levothyroxine (SYNTHROID) 75 MCG tablet Take 75 mcg by mouth every morning.     liothyronine (CYTOMEL) 5 MCG tablet TAKE 1 TABLET BY MOUTH TWICE A DAY ON AN EMPTY STOMACH  5   metFORMIN (GLUCOPHAGE-XR) 500 MG 24 hr tablet Take 500 mg by mouth in the morning and at bedtime.  11   methylphenidate 18 MG PO CR tablet      metoprolol tartrate (LOPRESSOR) 50 MG tablet Take one tablet by mouth 2 hours prior to CT Scan 1 tablet 0   montelukast (SINGULAIR) 10 MG tablet Take 1 tablet (10 mg total) by mouth at bedtime. 30 tablet 5   Oxycodone HCl 10 MG TABS Take 1 tablet (10 mg total) by mouth every 4 (four) hours as needed. 180 tablet 0   OZEMPIC, 1 MG/DOSE, 4 MG/3ML SOPN Inject 1 mg into the skin once a week.     Probiotic CHEW See admin instructions.     rosuvastatin (CRESTOR) 20 MG tablet Take 1 tablet (20 mg total) by mouth daily. 90 tablet 3   tiZANidine (ZANAFLEX) 4 MG tablet Take 4 mg by mouth in the morning and at bedtime.     Vitamin D, Ergocalciferol, (DRISDOL) 1.25 MG (50000 UNIT) CAPS capsule Take 1 capsule (50,000 Units total) by  mouth every 7 (seven) days. 7 capsule 0   YUVAFEM 10 MCG TABS vaginal tablet Place 1 tablet vaginally 2 (two) times a week.     zinc gluconate 50 MG tablet Take 50 mg by mouth daily.     No current facility-administered medications for this visit.    Medication Side Effects: None  Allergies:  Allergies  Allergen Reactions   5ht3 Receptor Antagonists Other (See Comments)    Other reaction(s): Unknown Other reaction(s): Unknown   Atorvastatin Other (See Comments)    Other reaction(s): muscle pain Other reaction(s): muscle pain   Erythromycin Nausea And Vomiting and Other (See Comments)    Other reaction(s): Upset GI Other reaction(s): Upset GI   Prednisone Other (See Comments)   Metronidazole Rash and Other (See Comments)  Other reaction(s): rash   Other Rash   Penicillins Rash    Other reaction(s): rash Other reaction(s): rash   Sulfamethoxazole-Trimethoprim Rash and Other (See Comments)    Other reaction(s): rash Other reaction(s): rash  Other reaction(s): rash    Past Medical History:  Diagnosis Date   Anemia    as a child    Anxiety    Asthma    Back pain    Chronic fatigue syndrome    Complication of anesthesia    slow to wake up age 13, but since cholecystectomy and no problems    Constipation    Depression    Diabetes (HCC)    Family history of anesthesia complication    father slow to wake up    Fatty liver    Fibromyalgia    GERD (gastroesophageal reflux disease)    Gestational diabetes    Headache    High blood pressure    High cholesterol    Hypertension    IBS (irritable bowel syndrome)    Joint pain    Lactose intolerance    Osteopenia    SOB (shortness of breath)     Family History  Problem Relation Age of Onset   Breast cancer Maternal Aunt    Allergic rhinitis Mother    Asthma Mother    High blood pressure Mother    High Cholesterol Mother    Depression Mother    Anxiety disorder Mother    Alcoholism Mother    Eczema Father     High blood pressure Father    High Cholesterol Father    Heart disease Father    Depression Father    Liver disease Father    Alcoholism Father    AAA (abdominal aortic aneurysm) Father    Angioedema Maternal Grandfather    Urticaria Maternal Grandfather     Social History   Socioeconomic History   Marital status: Married    Spouse name: Optometrist   Number of children: Not on file   Years of education: Not on file   Highest education level: Not on file  Occupational History   Occupation: Therapist, [employer]  Tobacco Use   Smoking status: Never   Smokeless tobacco: Never  Vaping Use   Vaping status: Never Used  Substance and Sexual Activity   Alcohol use: Yes    Comment: socially    Drug use: No   Sexual activity: Not on file  Other Topics Concern   Not on file  Social History Narrative   Not on file   Social Drivers of Health   Financial Resource Strain: Not on file  Food Insecurity: Low Risk  (08/31/2023)   Received from [facility]   Hunger Vital Sign    Worried About Running Out of Food in the Last Year: Never true    Ran Out of Food in the Last Year: Never true  Transportation Needs: No Transportation Needs (08/31/2023)   Received from [facility]    In the past 12 months, has lack of reliable transportation kept you from medical appointments, meetings, work or from getting things needed for daily living? : No  Physical Activity: Not on file  Stress: Not on file  Social Connections: Not on file  Intimate Partner Violence: Not on file    Past Medical History, Surgical history, Social history, and Family history were reviewed and updated as appropriate.   Please see review of systems for further details on  the patient's review from today.   Objective:   Physical Exam:  There were no vitals taken for this visit.  Physical Exam Constitutional:      General: She is not in acute distress. Musculoskeletal:         General: No deformity.  Neurological:     Mental Status: She is alert and oriented to person, place, and time.     Coordination: Coordination normal.  Psychiatric:        Attention and Perception: Attention and perception normal. She does not perceive auditory or visual hallucinations.        Mood and Affect: Mood normal. Mood is not anxious or depressed. Affect is not labile, blunt, angry or inappropriate.        Speech: Speech normal.        Behavior: Behavior normal.        Thought Content: Thought content normal. Thought content is not paranoid or delusional. Thought content does not include homicidal or suicidal ideation. Thought content does not include homicidal or suicidal plan.        Cognition and Memory: Cognition and memory normal.        Judgment: Judgment normal.     Comments: Insight intact     Lab Review:     Component Value Date/Time   NA 135 03/27/2023 1458   NA 142 07/05/2019 1048   K 3.6 03/27/2023 1458   CL 104 03/27/2023 1458   CO2 20 (L) 03/27/2023 1458   GLUCOSE 222 (H) 03/27/2023 1458   BUN 14 03/27/2023 1458   BUN 12 07/05/2019 1048   CREATININE 0.76 03/27/2023 1458   CALCIUM 9.1 03/27/2023 1458   PROT 7.2 07/05/2019 1048   ALBUMIN 5.1 (H) 07/05/2019 1048   AST 26 07/05/2019 1048   ALT 56 (H) 07/05/2019 1048   ALKPHOS 59 07/05/2019 1048   BILITOT 0.5 07/05/2019 1048   GFRNONAA >60 03/27/2023 1458   GFRAA 116 07/05/2019 1048       Component Value Date/Time   WBC 9.9 03/27/2023 1458   RBC 5.14 (H) 03/27/2023 1458   HGB 14.0 03/27/2023 1458   HGB 14.7 01/30/2019 1808   HCT 43.5 03/27/2023 1458   HCT 43.2 01/30/2019 1808   PLT 363 03/27/2023 1458   PLT 363 01/30/2019 1808   MCV 84.6 03/27/2023 1458   MCV 84 01/30/2019 1808   MCH 27.2 03/27/2023 1458   MCHC 32.2 03/27/2023 1458   RDW 13.8 03/27/2023 1458   RDW 13.3 01/30/2019 1808   LYMPHSABS 3.5 (H) 01/30/2019 1808   MONOABS 0.5 09/05/2010 2335   EOSABS 0.1 01/30/2019 1808   BASOSABS  0.1 01/30/2019 1808    No results found for: "POCLITH", "LITHIUM"   No results found for: "PHENYTOIN", "PHENOBARB", "VALPROATE", "CBMZ"   .res Assessment: Plan:    Plan:  PDMP reviewed  1. Lunesta 3mg  at bedtime for sleep 2. Lexapro 20mg  daily  RTC 6 months  25 minutes spent dedicated to the care of this patient on the date of this encounter to include pre-visit review of records, ordering of medication, post visit documentation, and face-to-face time with the patient discussing cognitive attention deficit, GAD, MDD, and insomnia. Discussed continuing current medication regimen.  Patient advised to contact office with any questions, adverse effects, or acute worsening in signs and symptoms.  Discussed potential metabolic side effects associated with atypical antipsychotics, as well as potential risk for movement side effects. Advised pt to contact office if movement side effects occur.  Discussed potential benefits, risks, and side effects of stimulants with patient to include increased heart rate, palpitations, insomnia, increased anxiety, increased irritability, or decreased appetite.  Instructed patient to contact office if experiencing any significant tolerability issues.  Diagnoses and all orders for this visit:  Major depressive disorder, recurrent episode, moderate (HCC) -     escitalopram (LEXAPRO) 20 MG tablet; Take 1 tablet (20 mg total) by mouth daily.  Generalized anxiety disorder -     escitalopram (LEXAPRO) 20 MG tablet; Take 1 tablet (20 mg total) by mouth daily.  Insomnia, unspecified type -     Eszopiclone 3 MG TABS; TAKE 1 TABLET BY MOUTH NIGHTLY IMMEDIATELY BEFORE BEDTIME  Cognitive attention deficit     Please see After Visit Summary for patient specific instructions.  Future Appointments  Date Time Provider Department Center  04/10/2024  2:20 PM Raulkar, Drema Pry, MD [facility]    No orders of the defined types were placed in this  encounter.     -------------------------------

## 2024-03-12 ENCOUNTER — Other Ambulatory Visit: Payer: Self-pay | Admitting: Internal Medicine

## 2024-03-12 DIAGNOSIS — Z1231 Encounter for screening mammogram for malignant neoplasm of breast: Secondary | ICD-10-CM

## 2024-03-28 ENCOUNTER — Other Ambulatory Visit: Payer: Self-pay | Admitting: Physical Medicine and Rehabilitation

## 2024-03-28 MED ORDER — OXYCODONE HCL 10 MG PO TABS: 10.0000 mg | ORAL_TABLET | ORAL | 0 refills | Status: DC | PRN

## 2024-04-04 DIAGNOSIS — E669 Obesity, unspecified: Secondary | ICD-10-CM | POA: Diagnosis not present

## 2024-04-04 DIAGNOSIS — E1165 Type 2 diabetes mellitus with hyperglycemia: Secondary | ICD-10-CM | POA: Diagnosis not present

## 2024-04-04 DIAGNOSIS — E890 Postprocedural hypothyroidism: Secondary | ICD-10-CM | POA: Diagnosis not present

## 2024-04-10 ENCOUNTER — Other Ambulatory Visit: Payer: Self-pay | Admitting: Physical Medicine and Rehabilitation

## 2024-04-10 ENCOUNTER — Encounter: Payer: Self-pay | Admitting: Physical Medicine and Rehabilitation

## 2024-04-10 ENCOUNTER — Ambulatory Visit
Admission: RE | Admit: 2024-04-10 | Discharge: 2024-04-10 | Disposition: A | Discharge status: Home / Self Care | Source: Ambulatory Visit | Attending: Physical Medicine and Rehabilitation

## 2024-04-10 ENCOUNTER — Telehealth: Payer: Self-pay

## 2024-04-10 ENCOUNTER — Encounter: Payer: PPO | Attending: Physical Medicine and Rehabilitation | Admitting: Physical Medicine and Rehabilitation

## 2024-04-10 VITALS — BP 96/67 | HR 88 | Ht 67.0 in | Wt 200.0 lb

## 2024-04-10 DIAGNOSIS — M546 Pain in thoracic spine: Secondary | ICD-10-CM | POA: Insufficient documentation

## 2024-04-10 DIAGNOSIS — G2582 Stiff-man syndrome: Secondary | ICD-10-CM

## 2024-04-10 DIAGNOSIS — M797 Fibromyalgia: Secondary | ICD-10-CM | POA: Insufficient documentation

## 2024-04-10 DIAGNOSIS — M545 Low back pain, unspecified: Secondary | ICD-10-CM

## 2024-04-10 DIAGNOSIS — G629 Polyneuropathy, unspecified: Secondary | ICD-10-CM | POA: Insufficient documentation

## 2024-04-10 DIAGNOSIS — M542 Cervicalgia: Secondary | ICD-10-CM | POA: Insufficient documentation

## 2024-04-10 DIAGNOSIS — R5383 Other fatigue: Secondary | ICD-10-CM | POA: Diagnosis not present

## 2024-04-10 DIAGNOSIS — J111 Influenza due to unidentified influenza virus with other respiratory manifestations: Secondary | ICD-10-CM

## 2024-04-10 NOTE — Progress Notes (Signed)
 Subjective:    Patient ID: Rhonda Bradley, female    DOB: 1966/07/08, 58 y.o.   MRN: [id number]  HPI Rhonda Bradley is a 58 year old woman who presents for follow-up of fibromyalgia, chronic fatigue, Hashimoto's.  1) Fibromyalgia: -she feels miserable -she feels that her throat is squeezing -she has been having more pain -she is taking oxycodone  three times per day -she was told she can take up to 4,000mg  extra strength tylenol  per day but she tries not to -when she called to make this appointment she was having an infection -she has had pain for many years, and it makes her miserable -she has had benefit from deep tissue massage. 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  -she loves to swim and this helps her  -heat, sometimes ice -she is prescribed diclofenac and oxycodone  and she tries to take these sparingly.  -Has tried Savella, Lyrica, gabapentin, Cymbalta, Seroquel , amitriptyline  without benefit or had side effect. -average pain is 8/10 -had episode of palpitations, and has felt at times like she is going to fall -she takes furosemide for lower extremity leg swelling.  -she used to be an exercise fanatic years ago and she does not know if this has contributed to a herniated discs. -pain worsens with the cold weather -feeling more pan today with the gloomy weather -last time she was here she mentioned to [facility] about knots in her neck.  -she gets myofascial pain in her neck and upper back, her legs, her shoulder blades bilaterally.  -worst on her left side.  -husband afraid of hurting her.  -she sometimes gets tightness to wear she can't move her neck.    2) Type 2 diabetes: -was started on insulin and metformin and other medications were stopped  3) Flu -one night she had fluctuating hypo and hypertension -she was taking Lantus and a short acting insulin- she took a short acting my accident -she waited an hour and then took the long acting -she took her  opioid and tylenol  and tizanidine -she was aching in a different way  4) Low back pain: -she has been having more spasms  Pain Inventory Average Pain 10 Pain Right Now 8 My pain is constant, sharp, burning, stabbing, tingling, and aching  In the last 24 hours, has pain interfered with the following? General activity 8 Relation with others 8 Enjoyment of life 8 What TIME of day is your pain at its worst? morning , daytime, evening, and night Sleep (in general) Fair  Pain is worse with: walking, bending, sitting, inactivity, standing, and some activites Pain improves with: rest, eat, medication Relief from Meds: 8     Family History  Problem Relation Age of Onset   Breast cancer Maternal Aunt    Allergic rhinitis Mother    Asthma Mother    High blood pressure Mother    High Cholesterol Mother    Depression Mother    Anxiety disorder Mother    Alcoholism Mother    Eczema Father    High blood pressure Father    High Cholesterol Father    Heart disease Father    Depression Father    Liver disease Father    Alcoholism Father    AAA (abdominal aortic aneurysm) Father    Angioedema Maternal Grandfather    Urticaria Maternal Grandfather    Social History   Socioeconomic History   Marital status: Married    Spouse name: Rhonda Bradley   Number of children:  Not on file   Years of education: Not on file   Highest education level: Not on file  Occupational History   Occupation: Therapist, [employer]  Tobacco Use   Smoking status: Never   Smokeless tobacco: Never  Vaping Use   Vaping status: Never Used  Substance and Sexual Activity   Alcohol use: Yes    Comment: socially    Drug use: No   Sexual activity: Not on file  Other Topics Concern   Not on file  Social History Narrative   Not on file   Social Drivers of Health   Financial Resource Strain: Not on file  Food Insecurity: Low Risk  (08/31/2023)   Received from [facility]   Hunger  Vital Sign    Worried About Running Out of Food in the Last Year: Never true    Ran Out of Food in the Last Year: Never true  Transportation Needs: No Transportation Needs (08/31/2023)   Received from [facility]    In the past 12 months, has lack of reliable transportation kept you from medical appointments, meetings, work or from getting things needed for daily living? : No  Physical Activity: Not on file  Stress: Not on file  Social Connections: Not on file   Past Surgical History:  Procedure Laterality Date   BREAST BIOPSY     BREAST EXCISIONAL BIOPSY     CESAREAN SECTION     x 3   CHOLECYSTECTOMY     THYROIDECTOMY N/A 09/19/2014   Procedure: THYROIDECTOMY;  Surgeon: Oralee Billow, MD;  Location: [facility];  Service: General;  Laterality: N/A;   Past Medical History:  Diagnosis Date   Anemia    as a child    Anxiety    Asthma    Back pain    Chronic fatigue syndrome    Complication of anesthesia    slow to wake up age 58, but since cholecystectomy and no problems    Constipation    Depression    Diabetes (HCC)    Family history of anesthesia complication    father slow to wake up    Fatty liver    Fibromyalgia    GERD (gastroesophageal reflux disease)    Gestational diabetes    Headache    High blood pressure    High cholesterol    Hypertension    IBS (irritable bowel syndrome)    Joint pain    Lactose intolerance    Osteopenia    SOB (shortness of breath)    Ht 5\' 7"  (1.702 m)   Wt 200 lb (90.7 kg)   BMI 31.32 kg/m   Opioid Risk Score:   Fall Risk Score:  `1  Depression screen PHQ 2/9     01/08/2022    2:21 PM 01/08/2022    2:20 PM 12/10/2021   11:10 AM 11/12/2021   11:16 AM 09/17/2021    3:12 PM 07/15/2021   11:12 AM 07/05/2019    7:29 AM  Depression screen PHQ 2/9  Decreased Interest 1 0 1 0 1 2 1   Down, Depressed, Hopeless 1 0 1 0 3 1 1   PHQ - 2 Score 2 0 2 0 4 3 2   Altered sleeping      3 0  Tired, decreased energy      3 1   Change in appetite      3 3  Feeling bad or failure about yourself  2 0  Trouble concentrating      1 3  Moving slowly or fidgety/restless      3 0  Suicidal thoughts      0 0  PHQ-9 Score      18 9  Difficult doing work/chores      Extremely dIfficult Extremely dIfficult       Review of Systems  Constitutional:  Negative for unexpected weight change.  HENT: Negative.    Eyes: Negative.   Respiratory:  Positive for wheezing.   Cardiovascular: Negative.   Gastrointestinal:  Negative for constipation, diarrhea and nausea.  Endocrine: Negative.   Genitourinary: Negative.   Musculoskeletal:  Positive for back pain, gait problem and neck pain.       Pain in riht and left leg , pain in both arms,   Skin: Negative.   Allergic/Immunologic: Negative.   Neurological:  Positive for weakness and headaches. Negative for numbness.  Hematological: Negative.   Psychiatric/Behavioral:  Positive for dysphoric mood. The patient is not nervous/anxious.        Anxiety  All other systems reviewed and are negative.      Objective:   Physical Exam Gen: no distress, normal appearing, weight 196 lbs, BMI 30.70, BP 106/71 HEENT: oral mucosa pink and moist, NCAT Cardio: Reg rate Chest: normal effort, normal rate of breathing Abd: soft, non-distended Ext: no edema Psych: pleasant, normal affect Skin: intact Neuro: Alert and oriented x3 Musculoskeletal: Diffuse pain on both sides of her body and in multiple joints. Tight cervical myofascia. Decreased range of motion and pain      Assessment & Plan:   1) Fibromyalgia: There is presence of widespread pain above and below the waist on both sides for more than 3 months that is consistent with fibromyalgia. There are related symptoms of fatigue, cognitive dysfunction, unrefreshing sleep, anxiety, and depression. Educated that both genetics and environment can impact fibromyalgia. -reviewed her current medications -discussed that she feels  miserable due to her pain -continue oxycodone  10mg  q4H prn -discussed that sunlight exposure helps her feel better Discussed that massage could be helpful -discussed tens unit Prescribing Home Zynex NexWave Stimulator Device and supplies as needed. IFC, NMES and TENS medically necessary Treatment Rx: Daily @ 30-40 minutes per treatment PRN. Zynex NexWave only, no substitutions. Treatment Goals: 1) To reduce and/or eliminate pain 2) To improve functional capacity and Activities of daily living 3) To reduce or prevent the need for oral medications 4) To improve circulation in the injured region 5) To decrease or prevent muscle spasm and muscle atrophy 6) To provide a self-management tool to the patient The patient has not sufficiently improved with conservative care. Numerous studies indexed by Medline and PubMed.gov have shown Neuromuscular, Interferential, and TENS stimulators to reduce pain, improve function, and reduce medication use in injured patients. Continued use of this evidence based, safe, drug free treatment is both reasonable and medically necessary at this time.  -discussed the importance of exercise -UDS obtained and contains expected metabolites. Discussed risks and benefits of oxycodone  and prescribed Percocet 5mg  BID PRN. Repeat UDS today.  -discussed side effects of NSAIDs.  -discussed side effects of oxycodone , increase to TID PRN.  -Bacopa recommended.  -recommended taking NAC 600mg  BID.  -continued massage, discussed ts other benefits.  -Discussed current symptoms of pain and history of pain.  -Discussed benefits of exercise in reducing pain. -Discussed following foods that may reduce pain: 1) Ginger (especially studied for arthritis)- reduce leukotriene production to decrease inflammation 2)  Blueberries- high in phytonutrients that decrease inflammation 3) Salmon- marine omega-3s reduce joint swelling and pain 4) Pumpkin seeds- reduce inflammation 5) dark chocolate-  reduces inflammation 6) turmeric- reduces inflammation 7) tart cherries - reduce pain and stiffness 8) extra virgin olive oil - its compound olecanthal helps to block prostaglandins  9) chili peppers- can be eaten or applied topically via capsaicin 10) mint- helpful for headache, muscle aches, joint pain, and itching 11) garlic- reduces inflammation  Link to further information on diet for chronic pain: [email or web address]   2) Stomach pain- has been diagnosed with IBS.  -advised against using the premier protein.   3) groin pain: -radiating from low back.   4) chronic cough -low dose prednisone helped for some time.   5) Cervical myofascial pain syndrome -referred for therapy for dry needling,  -trigger point injections.   6) Type 2 diabetes: -continue insulin -continue nuts, salmon, fish, chicken -check CBGs daily, log, and bring log to follow-up appointment -avoid sugar, bread, pasta, rice -avoid snacking -perform daily foot exam and at least annual eye exam -try to incorporate into your diet some of the following foods which are good for diabetes: 1) cinnamon- imitates effects of insulin, increasing glucose transport into cells ([place] or [place] cinnamon is best, least processed) 2) nuts- can slow down the blood sugar response of carbohydrate rich foods 3) oatmeal- contains and anti-inflammatory compound avenanthramide 4) whole-milk yogurt (best types are no sugar, [place] yogurt, or goat/sheep yogurt) 5) beans- high in protein, fiber, and vitamins, low glycemic index 6) broccoli- great source of vitamin A and C 7) quinoa- higher in protein and fiber than other grains 8) spinach- high in vitamin A, fiber, and protein 9) olive oil- reduces glucose levels, LDL, and triglycerides 10) salmon- excellent amount of omega-3-fatty acids 11) walnuts- rich in antioxidants 12) apples- high in fiber and  quercetin 13) carrots- highly nutritious with low impact on blood sugar 14) eggs- improve HDL (good cholesterol), high in protein, keep you satiated 15) turmeric: improves blood sugars, cardiovascular disease, and protects kidney health 16) garlic: improves blood sugar, blood pressure, pain 17) tomatoes: highly nutritious with low impact on blood sugar   8) Migraines: -encouraged avoiding gluten  9) Hungry: -encouraged drinking tea between the meals  10) Flu: -discussed the medications she took the night she had a flu and how she woke up and her body was cold- frozen stiff and she had to work to get her fingers open -discussed that she got off her short acting  11) Low back pain : XR ordered  -Discussed Qutenza as an option for neuropathic pain control. Discussed that this is a capsaicin patch, stronger than capsaicin cream. Discussed that it is currently approved for diabetic peripheral neuropathy and post-herpetic neuralgia, but that it has also shown benefit in treating other forms of neuropathy. Provided patient with link to site to learn more about the patch: https://www.clark.biz/. Discussed that the patch would be placed in office and benefits usually last 3 months. Discussed that unintended exposure to capsaicin can cause severe irritation of eyes, mucous membranes, respiratory tract, and skin, but that Qutenza is a local treatment and does not have the systemic side effects of other nerve medications. Discussed that there may be pain, itching, erythema, and decreased sensory function associated with the application of Qutenza. Side effects usually subside within 1 week. A cold pack of analgesic medications can help with these side effects. Blood pressure can also be increased due to pain associated with  administration of the patch.   12) Thoracic spine pain: XR ordered  13) Cervical spine pain: XR ordered  14) Neuropathy -Discussed Qutenza as an option for neuropathic pain  control. Discussed that this is a capsaicin patch, stronger than capsaicin cream. Discussed that it is currently approved for diabetic peripheral neuropathy and post-herpetic neuralgia, but that it has also shown benefit in treating other forms of neuropathy. Provided patient with link to site to learn more about the patch: https://www.clark.biz/. Discussed that the patch would be placed in office and benefits usually last 3 months. Discussed that unintended exposure to capsaicin can cause severe irritation of eyes, mucous membranes, respiratory tract, and skin, but that Qutenza is a local treatment and does not have the systemic side effects of other nerve medications. Discussed that there may be pain, itching, erythema, and decreased sensory function associated with the application of Qutenza. Side effects usually subside within 1 week. A cold pack of analgesic medications can help with these side effects. Blood pressure can also be increased due to pain associated with administration of the patch.   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

## 2024-04-10 NOTE — Telephone Encounter (Signed)
 Rhonda Bradley was thinking about maybe trying Pregabalin for Fibromyalgia. What do you think? Please advise.   Call back phone [phone].

## 2024-04-10 NOTE — Patient Instructions (Signed)
 Avoid processed foods, eggs, gluten, dairy   Red light therapy

## 2024-04-11 ENCOUNTER — Other Ambulatory Visit: Payer: Self-pay | Admitting: Physical Medicine and Rehabilitation

## 2024-04-11 MED ORDER — PREGABALIN 25 MG PO CAPS: 25.0000 mg | ORAL_CAPSULE | Freq: Every day | ORAL | 3 refills | Status: DC | PRN

## 2024-04-12 ENCOUNTER — Encounter (HOSPITAL_BASED_OUTPATIENT_CLINIC_OR_DEPARTMENT_OTHER): Admitting: Physical Medicine and Rehabilitation

## 2024-04-12 DIAGNOSIS — M797 Fibromyalgia: Secondary | ICD-10-CM

## 2024-04-12 DIAGNOSIS — M542 Cervicalgia: Secondary | ICD-10-CM | POA: Diagnosis not present

## 2024-04-13 NOTE — Progress Notes (Signed)
 Subjective:    Patient ID: Rhonda Bradley, female    DOB: 04/21/1966, 58 y.o.   MRN: [id number]  HPI An audio/video tele-health visit is felt to be the most appropriate encounter for this patient at this time. This is a follow up tele-visit via phone. The patient is at home. MD is at office. Prior to scheduling this appointment, our staff discussed the limitations of evaluation and management by telemedicine and the availability of in-person appointments. The patient expressed understanding and agreed to proceed.   Rhonda Bradley is a 58 year old woman who presents for follow-up of fibromyalgia, chronic fatigue, Hashimoto's.  1) Fibromyalgia: -she feels miserable -she feels that her throat is squeezing -she has been having more pain -she is taking oxycodone  three times per day -she was told she can take up to 4,000mg  extra strength tylenol  per day but she tries not to -when she called to make this appointment she was having an infection -she has had pain for many years, and it makes her miserable -she has had benefit from deep tissue massage. 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  -she loves to swim and this helps her  -heat, sometimes ice -she is prescribed diclofenac and oxycodone  and she tries to take these sparingly.  -Has tried Savella, Lyrica , gabapentin, Cymbalta, Seroquel , amitriptyline  without benefit or had side effect. -average pain is 8/10 -had episode of palpitations, and has felt at times like she is going to fall -she takes furosemide for lower extremity leg swelling.  -she used to be an exercise fanatic years ago and she does not know if this has contributed to a herniated discs. -pain worsens with the cold weather -feeling more pan today with the gloomy weather -last time she was here she mentioned to [facility] about knots in her neck.  -she gets myofascial pain in her neck and upper back, her legs, her shoulder blades bilaterally.  -worst on her left side.   -husband afraid of hurting her.  -she sometimes gets tightness to wear she can't move her neck.    2) Type 2 diabetes: -was started on insulin and metformin and other medications were stopped  3) Flu -one night she had fluctuating hypo and hypertension -she was taking Lantus and a short acting insulin- she took a short acting my accident -she waited an hour and then took the long acting -she took her opioid and tylenol  and tizanidine -she was aching in a different way  4) Low back pain: -she has been having more spasms -discussed XR results  Pain Inventory Average Pain 10 Pain Right Now 8 My pain is constant, sharp, burning, stabbing, tingling, and aching  In the last 24 hours, has pain interfered with the following? General activity 8 Relation with others 8 Enjoyment of life 8 What TIME of day is your pain at its worst? morning , daytime, evening, and night Sleep (in general) Fair  Pain is worse with: walking, bending, sitting, inactivity, standing, and some activites Pain improves with: rest, eat, medication Relief from Meds: 8     Family History  Problem Relation Age of Onset   Breast cancer Maternal Aunt    Allergic rhinitis Mother    Asthma Mother    High blood pressure Mother    High Cholesterol Mother    Depression Mother    Anxiety disorder Mother    Alcoholism Mother    Eczema Father    High blood pressure Father  High Cholesterol Father    Heart disease Father    Depression Father    Liver disease Father    Alcoholism Father    AAA (abdominal aortic aneurysm) Father    Angioedema Maternal Grandfather    Urticaria Maternal Grandfather    Social History   Socioeconomic History   Marital status: Married    Spouse name: Optometrist   Number of children: Not on file   Years of education: Not on file   Highest education level: Not on file  Occupational History   Occupation: Therapist, [employer]  Tobacco Use   Smoking  status: Never   Smokeless tobacco: Never  Vaping Use   Vaping status: Never Used  Substance and Sexual Activity   Alcohol use: Yes    Comment: socially    Drug use: No   Sexual activity: Not on file  Other Topics Concern   Not on file  Social History Narrative   Not on file   Social Drivers of Health   Financial Resource Strain: Not on file  Food Insecurity: Low Risk  (08/31/2023)   Received from [facility]   Hunger Vital Sign    Worried About Running Out of Food in the Last Year: Never true    Ran Out of Food in the Last Year: Never true  Transportation Needs: No Transportation Needs (08/31/2023)   Received from [facility]    In the past 12 months, has lack of reliable transportation kept you from medical appointments, meetings, work or from getting things needed for daily living? : No  Physical Activity: Not on file  Stress: Not on file  Social Connections: Not on file   Past Surgical History:  Procedure Laterality Date   BREAST BIOPSY     BREAST EXCISIONAL BIOPSY     CESAREAN SECTION     x 3   CHOLECYSTECTOMY     THYROIDECTOMY N/A 09/19/2014   Procedure: THYROIDECTOMY;  Surgeon: Oralee Billow, MD;  Location: [facility];  Service: General;  Laterality: N/A;   Past Medical History:  Diagnosis Date   Anemia    as a child    Anxiety    Asthma    Back pain    Chronic fatigue syndrome    Complication of anesthesia    slow to wake up age 89, but since cholecystectomy and no problems    Constipation    Depression    Diabetes (HCC)    Family history of anesthesia complication    father slow to wake up    Fatty liver    Fibromyalgia    GERD (gastroesophageal reflux disease)    Gestational diabetes    Headache    High blood pressure    High cholesterol    Hypertension    IBS (irritable bowel syndrome)    Joint pain    Lactose intolerance    Osteopenia    SOB (shortness of breath)    There were no vitals taken for this visit.  Opioid Risk  Score:   Fall Risk Score:  `1  Depression screen PHQ 2/9     04/10/2024    2:25 PM 01/08/2022    2:21 PM 01/08/2022    2:20 PM 12/10/2021   11:10 AM 11/12/2021   11:16 AM 09/17/2021    3:12 PM 07/15/2021   11:12 AM  Depression screen PHQ 2/9  Decreased Interest 1 1 0 1 0 1 2  Down, Depressed, Hopeless 1 1  0 1 0 3 1  PHQ - 2 Score 2 2 0 2 0 4 3  Altered sleeping       3  Tired, decreased energy       3  Change in appetite       3  Feeling bad or failure about yourself        2  Trouble concentrating       1  Moving slowly or fidgety/restless       3  Suicidal thoughts       0  PHQ-9 Score       18  Difficult doing work/chores       Extremely dIfficult       Review of Systems  Constitutional:  Negative for unexpected weight change.  HENT: Negative.    Eyes: Negative.   Respiratory:  Positive for wheezing.   Cardiovascular: Negative.   Gastrointestinal:  Negative for constipation, diarrhea and nausea.  Endocrine: Negative.   Genitourinary: Negative.   Musculoskeletal:  Positive for back pain, gait problem and neck pain.       Pain in riht and left leg , pain in both arms,   Skin: Negative.   Allergic/Immunologic: Negative.   Neurological:  Positive for weakness and headaches. Negative for numbness.  Hematological: Negative.   Psychiatric/Behavioral:  Positive for dysphoric mood. The patient is not nervous/anxious.        Anxiety  All other systems reviewed and are negative.      Objective:   Physical Exam PRIOR EXAM Gen: no distress, normal appearing, weight 196 lbs, BMI 30.70, BP 106/71 HEENT: oral mucosa pink and moist, NCAT Cardio: Reg rate Chest: normal effort, normal rate of breathing Abd: soft, non-distended Ext: no edema Psych: pleasant, normal affect Skin: intact Neuro: Alert and oriented x3 Musculoskeletal: Diffuse pain on both sides of her body and in multiple joints. Tight cervical myofascia. Decreased range of motion and pain      Assessment & Plan:    1) Fibromyalgia: There is presence of widespread pain above and below the waist on both sides for more than 3 months that is consistent with fibromyalgia. There are related symptoms of fatigue, cognitive dysfunction, unrefreshing sleep, anxiety, and depression. Educated that both genetics and environment can impact fibromyalgia. -reviewed her current medications -discussed Lyrica  but she has failed in the past -discussed that she feels miserable due to her pain -continue oxycodone  10mg  q4H prn -discussed that sunlight exposure helps her feel better Discussed that massage could be helpful -discussed tens unit Prescribing Home Zynex NexWave Stimulator Device and supplies as needed. IFC, NMES and TENS medically necessary Treatment Rx: Daily @ 30-40 minutes per treatment PRN. Zynex NexWave only, no substitutions. Treatment Goals: 1) To reduce and/or eliminate pain 2) To improve functional capacity and Activities of daily living 3) To reduce or prevent the need for oral medications 4) To improve circulation in the injured region 5) To decrease or prevent muscle spasm and muscle atrophy 6) To provide a self-management tool to the patient The patient has not sufficiently improved with conservative care. Numerous studies indexed by Medline and PubMed.gov have shown Neuromuscular, Interferential, and TENS stimulators to reduce pain, improve function, and reduce medication use in injured patients. Continued use of this evidence based, safe, drug free treatment is both reasonable and medically necessary at this time.  -discussed the importance of exercise -UDS obtained and contains expected metabolites. Discussed risks and benefits of oxycodone  and prescribed Percocet 5mg  BID PRN. Repeat  UDS today.  -discussed side effects of NSAIDs.  -discussed side effects of oxycodone , increase to TID PRN.  -Bacopa recommended.  -recommended taking NAC 600mg  BID.  -continued massage, discussed ts other benefits.   -Discussed current symptoms of pain and history of pain.  -Discussed benefits of exercise in reducing pain. -Discussed following foods that may reduce pain: 1) Ginger (especially studied for arthritis)- reduce leukotriene production to decrease inflammation 2) Blueberries- high in phytonutrients that decrease inflammation 3) Salmon- marine omega-3s reduce joint swelling and pain 4) Pumpkin seeds- reduce inflammation 5) dark chocolate- reduces inflammation 6) turmeric- reduces inflammation 7) tart cherries - reduce pain and stiffness 8) extra virgin olive oil - its compound olecanthal helps to block prostaglandins  9) chili peppers- can be eaten or applied topically via capsaicin 10) mint- helpful for headache, muscle aches, joint pain, and itching 11) garlic- reduces inflammation  Link to further information on diet for chronic pain: http://[email or web address].com/   2) Stomach pain- has been diagnosed with IBS.  -advised against using the premier protein.   3) groin pain: -radiating from low back.   4) chronic cough -low dose prednisone helped for some time.   5) Cervical myofascial pain syndrome -referred for therapy for dry needling,  -trigger point injections.   6) Type 2 diabetes: -continue insulin -continue nuts, salmon, fish, chicken -check CBGs daily, log, and bring log to follow-up appointment -avoid sugar, bread, pasta, rice -avoid snacking -perform daily foot exam and at least annual eye exam -try to incorporate into your diet some of the following foods which are good for diabetes: 1) cinnamon- imitates effects of insulin, increasing glucose transport into cells ([place] or [place] cinnamon is best, least processed) 2) nuts- can slow down the blood sugar response of carbohydrate rich foods 3) oatmeal- contains and anti-inflammatory compound avenanthramide 4) whole-milk yogurt (best types are no sugar,  [place] yogurt, or goat/sheep yogurt) 5) beans- high in protein, fiber, and vitamins, low glycemic index 6) broccoli- great source of vitamin A and C 7) quinoa- higher in protein and fiber than other grains 8) spinach- high in vitamin A, fiber, and protein 9) olive oil- reduces glucose levels, LDL, and triglycerides 10) salmon- excellent amount of omega-3-fatty acids 11) walnuts- rich in antioxidants 12) apples- high in fiber and quercetin 13) carrots- highly nutritious with low impact on blood sugar 14) eggs- improve HDL (good cholesterol), high in protein, keep you satiated 15) turmeric: improves blood sugars, cardiovascular disease, and protects kidney health 16) garlic: improves blood sugar, blood pressure, pain 17) tomatoes: highly nutritious with low impact on blood sugar   8) Migraines: -encouraged avoiding gluten  9) Hungry: -encouraged drinking tea between the meals  10) Flu: -discussed the medications she took the night she had a flu and how she woke up and her body was cold- frozen stiff and she had to work to get her fingers open -discussed that she got off her short acting  11) Low back pain : XR ordered  -Discussed Qutenza as an option for neuropathic pain control. Discussed that this is a capsaicin patch, stronger than capsaicin cream. Discussed that it is currently approved for diabetic peripheral neuropathy and post-herpetic neuralgia, but that it has also shown benefit in treating other forms of neuropathy. Provided patient with link to site to learn more about the patch: https://www.clark.biz/. Discussed that the patch would be placed in office and benefits usually last 3 months. Discussed that unintended exposure to capsaicin can cause severe irritation of eyes, mucous  membranes, respiratory tract, and skin, but that Qutenza is a local treatment and does not have the systemic side effects of other nerve medications. Discussed that there may be pain, itching, erythema,  and decreased sensory function associated with the application of Qutenza. Side effects usually subside within 1 week. A cold pack of analgesic medications can help with these side effects. Blood pressure can also be increased due to pain associated with administration of the patch.   12) Thoracic spine pain: XR ordered  13) Cervical spine pain: -discussed that XR shows disc degeneration  14) Neuropathy -Discussed Qutenza as an option for neuropathic pain control. Discussed that this is a capsaicin patch, stronger than capsaicin cream. Discussed that it is currently approved for diabetic peripheral neuropathy and post-herpetic neuralgia, but that it has also shown benefit in treating other forms of neuropathy. Provided patient with link to site to learn more about the patch: https://www.clark.biz/. Discussed that the patch would be placed in office and benefits usually last 3 months. Discussed that unintended exposure to capsaicin can cause severe irritation of eyes, mucous membranes, respiratory tract, and skin, but that Qutenza is a local treatment and does not have the systemic side effects of other nerve medications. Discussed that there may be pain, itching, erythema, and decreased sensory function associated with the application of Qutenza. Side effects usually subside within 1 week. A cold pack of analgesic medications can help with these side effects. Blood pressure can also be increased due to pain associated with administration of the patch.   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

## 2024-04-16 LAB — GLUTAMIC ACID DECARBOXYLASE AUTO ABS: Glutamic Acid Decarb Ab: <5 U/mL (ref 0.0–5.0)

## 2024-04-20 ENCOUNTER — Encounter: Admitting: Physical Medicine and Rehabilitation

## 2024-04-20 DIAGNOSIS — R5383 Other fatigue: Secondary | ICD-10-CM

## 2024-04-20 MED ORDER — METHYLPHENIDATE HCL 5 MG PO TABS
5.0000 mg | ORAL_TABLET | Freq: Every day | ORAL | 0 refills | Status: AC
Start: 2024-04-20 — End: ?

## 2024-04-20 NOTE — Progress Notes (Signed)
 Subjective:    Patient ID: Rhonda Bradley, female    DOB: 04/07/1966, 58 y.o.   MRN: [id number]  HPI An audio/video tele-health visit is felt to be the most appropriate encounter for this patient at this time. This is a follow up tele-visit via phone. The patient is at home. MD is at office. Prior to scheduling this appointment, our staff discussed the limitations of evaluation and management by telemedicine and the availability of in-person appointments. The patient expressed understanding and agreed to proceed.   Rhonda Bradley is a 58 year old woman who presents for follow-up of fibromyalgia, chronic fatigue, Hashimoto's.  1) Fibromyalgia which causes chronic pain and fatigue: -fatigue has been especially severe -she is interested in trying Ritalin  -she feels miserable -she feels that her throat is squeezing -she has been having more pain -she is taking oxycodone  three times per day -she was told she can take up to 4,000mg  extra strength tylenol  per day but she tries not to -when she called to make this appointment she was having an infection -she has had pain for many years, and it makes her miserable -she has had benefit from deep tissue massage. 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  -she loves to swim and this helps her  -heat, sometimes ice -she is prescribed diclofenac and oxycodone  and she tries to take these sparingly.  -Has tried Savella, Lyrica , gabapentin, Cymbalta, Seroquel , amitriptyline  without benefit or had side effect. -average pain is 8/10 -had episode of palpitations, and has felt at times like she is going to fall -she takes furosemide for lower extremity leg swelling.  -she used to be an exercise fanatic years ago and she does not know if this has contributed to a herniated discs. -pain worsens with the cold weather -feeling more pan today with the gloomy weather -last time she was here she mentioned to [facility] about knots in her neck.  -she gets  myofascial pain in her neck and upper back, her legs, her shoulder blades bilaterally.  -worst on her left side.  -husband afraid of hurting her.  -she sometimes gets tightness to wear she can't move her neck.    2) Type 2 diabetes: -was started on insulin and metformin and other medications were stopped  3) Flu -one night she had fluctuating hypo and hypertension -she was taking Lantus and a short acting insulin- she took a short acting my accident -she waited an hour and then took the long acting -she took her opioid and tylenol  and tizanidine -she was aching in a different way  4) Low back pain: -she has been having more spasms -discussed XR results  Pain Inventory Average Pain 10 Pain Right Now 8 My pain is constant, sharp, burning, stabbing, tingling, and aching  In the last 24 hours, has pain interfered with the following? General activity 8 Relation with others 8 Enjoyment of life 8 What TIME of day is your pain at its worst? morning , daytime, evening, and night Sleep (in general) Fair  Pain is worse with: walking, bending, sitting, inactivity, standing, and some activites Pain improves with: rest, eat, medication Relief from Meds: 8     Family History  Problem Relation Age of Onset   Breast cancer Maternal Aunt    Allergic rhinitis Mother    Asthma Mother    High blood pressure Mother    High Cholesterol Mother    Depression Mother    Anxiety disorder Mother  Alcoholism Mother    Eczema Father    High blood pressure Father    High Cholesterol Father    Heart disease Father    Depression Father    Liver disease Father    Alcoholism Father    AAA (abdominal aortic aneurysm) Father    Angioedema Maternal Grandfather    Urticaria Maternal Grandfather    Social History   Socioeconomic History   Marital status: Married    Spouse name: Optometrist   Number of children: Not on file   Years of education: Not on file   Highest education level: Not on file   Occupational History   Occupation: Therapist, [employer]  Tobacco Use   Smoking status: Never   Smokeless tobacco: Never  Vaping Use   Vaping status: Never Used  Substance and Sexual Activity   Alcohol use: Yes    Comment: socially    Drug use: No   Sexual activity: Not on file  Other Topics Concern   Not on file  Social History Narrative   Not on file   Social Drivers of Health   Financial Resource Strain: Not on file  Food Insecurity: Low Risk  (08/31/2023)   Received from [facility]   Hunger Vital Sign    Worried About Running Out of Food in the Last Year: Never true    Ran Out of Food in the Last Year: Never true  Transportation Needs: No Transportation Needs (08/31/2023)   Received from [facility]    In the past 12 months, has lack of reliable transportation kept you from medical appointments, meetings, work or from getting things needed for daily living? : No  Physical Activity: Not on file  Stress: Not on file  Social Connections: Not on file   Past Surgical History:  Procedure Laterality Date   BREAST BIOPSY     BREAST EXCISIONAL BIOPSY     CESAREAN SECTION     x 3   CHOLECYSTECTOMY     THYROIDECTOMY N/A 09/19/2014   Procedure: THYROIDECTOMY;  Surgeon: Oralee Billow, MD;  Location: [facility];  Service: General;  Laterality: N/A;   Past Medical History:  Diagnosis Date   Anemia    as a child    Anxiety    Asthma    Back pain    Chronic fatigue syndrome    Complication of anesthesia    slow to wake up age 27, but since cholecystectomy and no problems    Constipation    Depression    Diabetes (HCC)    Family history of anesthesia complication    father slow to wake up    Fatty liver    Fibromyalgia    GERD (gastroesophageal reflux disease)    Gestational diabetes    Headache    High blood pressure    High cholesterol    Hypertension    IBS (irritable bowel syndrome)    Joint pain    Lactose intolerance     Osteopenia    SOB (shortness of breath)    There were no vitals taken for this visit.  Opioid Risk Score:   Fall Risk Score:  `1  Depression screen PHQ 2/9     04/10/2024    2:25 PM 01/08/2022    2:21 PM 01/08/2022    2:20 PM 12/10/2021   11:10 AM 11/12/2021   11:16 AM 09/17/2021    3:12 PM 07/15/2021   11:12 AM  Depression screen PHQ  2/9  Decreased Interest 1 1 0 1 0 1 2  Down, Depressed, Hopeless 1 1 0 1 0 3 1  PHQ - 2 Score 2 2 0 2 0 4 3  Altered sleeping       3  Tired, decreased energy       3  Change in appetite       3  Feeling bad or failure about yourself        2  Trouble concentrating       1  Moving slowly or fidgety/restless       3  Suicidal thoughts       0  PHQ-9 Score       18  Difficult doing work/chores       Extremely dIfficult       Review of Systems  Constitutional:  Negative for unexpected weight change.  HENT: Negative.    Eyes: Negative.   Respiratory:  Positive for wheezing.   Cardiovascular: Negative.   Gastrointestinal:  Negative for constipation, diarrhea and nausea.  Endocrine: Negative.   Genitourinary: Negative.   Musculoskeletal:  Positive for back pain, gait problem and neck pain.       Pain in riht and left leg , pain in both arms,   Skin: Negative.   Allergic/Immunologic: Negative.   Neurological:  Positive for weakness and headaches. Negative for numbness.  Hematological: Negative.   Psychiatric/Behavioral:  Positive for dysphoric mood. The patient is not nervous/anxious.        Anxiety  All other systems reviewed and are negative.      Objective:   Physical Exam PRIOR EXAM Gen: no distress, normal appearing, weight 196 lbs, BMI 30.70, BP 106/71 HEENT: oral mucosa pink and moist, NCAT Cardio: Reg rate Chest: normal effort, normal rate of breathing Abd: soft, non-distended Ext: no edema Psych: pleasant, normal affect Skin: intact Neuro: Alert and oriented x3 Musculoskeletal: Diffuse pain on both sides of her body and in  multiple joints. Tight cervical myofascia. Decreased range of motion and pain      Assessment & Plan:   1) Fibromyalgia: There is presence of widespread pain above and below the waist on both sides for more than 3 months that is consistent with fibromyalgia. There are related symptoms of fatigue, cognitive dysfunction, unrefreshing sleep, anxiety, and depression. Educated that both genetics and environment can impact fibromyalgia. -reviewed her current medications -discussed her fatigue, discussed Ritalin , discussed elimination diet rich in fruits and vegetables, discussed that antibody testing for stiffperson syndrom was negative -discussed Lyrica  but she has failed in the past -discussed that she feels miserable due to her pain -continue oxycodone  10mg  q4H prn -discussed that sunlight exposure helps her feel better Discussed that massage could be helpful -discussed tens unit Prescribing Home Zynex NexWave Stimulator Device and supplies as needed. IFC, NMES and TENS medically necessary Treatment Rx: Daily @ 30-40 minutes per treatment PRN. Zynex NexWave only, no substitutions. Treatment Goals: 1) To reduce and/or eliminate pain 2) To improve functional capacity and Activities of daily living 3) To reduce or prevent the need for oral medications 4) To improve circulation in the injured region 5) To decrease or prevent muscle spasm and muscle atrophy 6) To provide a self-management tool to the patient The patient has not sufficiently improved with conservative care. Numerous studies indexed by Medline and PubMed.gov have shown Neuromuscular, Interferential, and TENS stimulators to reduce pain, improve function, and reduce medication use in injured patients. Continued use of this evidence  based, safe, drug free treatment is both reasonable and medically necessary at this time.  -discussed the importance of exercise -UDS obtained and contains expected metabolites. Discussed risks and benefits of  oxycodone  and prescribed Percocet 5mg  BID PRN. Repeat UDS today.  -discussed side effects of NSAIDs.  -discussed side effects of oxycodone , increase to TID PRN.  -Bacopa recommended.  -recommended taking NAC 600mg  BID.  -continued massage, discussed ts other benefits.  -Discussed current symptoms of pain and history of pain.  -Discussed benefits of exercise in reducing pain. -Discussed following foods that may reduce pain: 1) Ginger (especially studied for arthritis)- reduce leukotriene production to decrease inflammation 2) Blueberries- high in phytonutrients that decrease inflammation 3) Salmon- marine omega-3s reduce joint swelling and pain 4) Pumpkin seeds- reduce inflammation 5) dark chocolate- reduces inflammation 6) turmeric- reduces inflammation 7) tart cherries - reduce pain and stiffness 8) extra virgin olive oil - its compound olecanthal helps to block prostaglandins  9) chili peppers- can be eaten or applied topically via capsaicin 10) mint- helpful for headache, muscle aches, joint pain, and itching 11) garlic- reduces inflammation  Link to further information on diet for chronic pain: [email or web address].[email or web address]   2) Stomach pain- has been diagnosed with IBS.  -advised against using the premier protein.   3) groin pain: -radiating from low back.   4) chronic cough -low dose prednisone helped for some time.   5) Cervical myofascial pain syndrome -referred for therapy for dry needling,  -trigger point injections.   6) Type 2 diabetes: -continue insulin -continue nuts, salmon, fish, chicken -check CBGs daily, log, and bring log to follow-up appointment -avoid sugar, bread, pasta, rice -avoid snacking -perform daily foot exam and at least annual eye exam -try to incorporate into your diet some of the following foods which are good for diabetes: 1) cinnamon- imitates effects of insulin, increasing  glucose transport into cells ([place] or [place] cinnamon is best, least processed) 2) nuts- can slow down the blood sugar response of carbohydrate rich foods 3) oatmeal- contains and anti-inflammatory compound avenanthramide 4) whole-milk yogurt (best types are no sugar, [place] yogurt, or goat/sheep yogurt) 5) beans- high in protein, fiber, and vitamins, low glycemic index 6) broccoli- great source of vitamin A and C 7) quinoa- higher in protein and fiber than other grains 8) spinach- high in vitamin A, fiber, and protein 9) olive oil- reduces glucose levels, LDL, and triglycerides 10) salmon- excellent amount of omega-3-fatty acids 11) walnuts- rich in antioxidants 12) apples- high in fiber and quercetin 13) carrots- highly nutritious with low impact on blood sugar 14) eggs- improve HDL (good cholesterol), high in protein, keep you satiated 15) turmeric: improves blood sugars, cardiovascular disease, and protects kidney health 16) garlic: improves blood sugar, blood pressure, pain 17) tomatoes: highly nutritious with low impact on blood sugar   8) Migraines: -encouraged avoiding gluten  9) Hungry: -encouraged drinking tea between the meals  10) Flu: -discussed the medications she took the night she had a flu and how she woke up and her body was cold- frozen stiff and she had to work to get her fingers open -discussed that she got off her short acting  11) Low back pain : XR ordered  -Discussed Qutenza as an option for neuropathic pain control. Discussed that this is a capsaicin patch, stronger than capsaicin cream. Discussed that it is currently approved for diabetic peripheral neuropathy and post-herpetic neuralgia, but that it has also shown benefit in treating other forms of neuropathy. Provided patient  with link to site to learn more about the patch: https://www.clark.biz/. Discussed that the patch would be placed in office and benefits usually last 3 months. Discussed that  unintended exposure to capsaicin can cause severe irritation of eyes, mucous membranes, respiratory tract, and skin, but that Qutenza is a local treatment and does not have the systemic side effects of other nerve medications. Discussed that there may be pain, itching, erythema, and decreased sensory function associated with the application of Qutenza. Side effects usually subside within 1 week. A cold pack of analgesic medications can help with these side effects. Blood pressure can also be increased due to pain associated with administration of the patch.   12) Thoracic spine pain: XR ordered  13) Cervical spine pain: -discussed that XR shows disc degeneration  14) Neuropathy -Discussed Qutenza as an option for neuropathic pain control. Discussed that this is a capsaicin patch, stronger than capsaicin cream. Discussed that it is currently approved for diabetic peripheral neuropathy and post-herpetic neuralgia, but that it has also shown benefit in treating other forms of neuropathy. Provided patient with link to site to learn more about the patch: https://www.clark.biz/. Discussed that the patch would be placed in office and benefits usually last 3 months. Discussed that unintended exposure to capsaicin can cause severe irritation of eyes, mucous membranes, respiratory tract, and skin, but that Qutenza is a local treatment and does not have the systemic side effects of other nerve medications. Discussed that there may be pain, itching, erythema, and decreased sensory function associated with the application of Qutenza. Side effects usually subside within 1 week. A cold pack of analgesic medications can help with these side effects. Blood pressure can also be increased due to pain associated with administration of the patch.   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

## 2024-04-24 ENCOUNTER — Encounter: Payer: Self-pay | Admitting: Physical Medicine and Rehabilitation

## 2024-04-25 DIAGNOSIS — L814 Other melanin hyperpigmentation: Secondary | ICD-10-CM | POA: Diagnosis not present

## 2024-04-25 DIAGNOSIS — D225 Melanocytic nevi of trunk: Secondary | ICD-10-CM | POA: Diagnosis not present

## 2024-04-25 DIAGNOSIS — L408 Other psoriasis: Secondary | ICD-10-CM | POA: Diagnosis not present

## 2024-04-25 DIAGNOSIS — L821 Other seborrheic keratosis: Secondary | ICD-10-CM | POA: Diagnosis not present

## 2024-04-25 DIAGNOSIS — L720 Epidermal cyst: Secondary | ICD-10-CM | POA: Diagnosis not present

## 2024-04-26 ENCOUNTER — Ambulatory Visit

## 2024-05-07 DIAGNOSIS — Z8619 Personal history of other infectious and parasitic diseases: Secondary | ICD-10-CM | POA: Diagnosis not present

## 2024-05-07 DIAGNOSIS — N958 Other specified menopausal and perimenopausal disorders: Secondary | ICD-10-CM | POA: Diagnosis not present

## 2024-05-07 DIAGNOSIS — Z01419 Encounter for gynecological examination (general) (routine) without abnormal findings: Secondary | ICD-10-CM | POA: Diagnosis not present

## 2024-05-07 DIAGNOSIS — R399 Unspecified symptoms and signs involving the genitourinary system: Secondary | ICD-10-CM | POA: Diagnosis not present

## 2024-05-07 DIAGNOSIS — N898 Other specified noninflammatory disorders of vagina: Secondary | ICD-10-CM | POA: Diagnosis not present

## 2024-05-09 ENCOUNTER — Encounter: Attending: Physical Medicine and Rehabilitation | Admitting: Registered Nurse

## 2024-05-09 ENCOUNTER — Encounter: Payer: Self-pay | Admitting: Registered Nurse

## 2024-05-09 VITALS — Ht 67.0 in | Wt 198.0 lb

## 2024-05-09 DIAGNOSIS — M797 Fibromyalgia: Secondary | ICD-10-CM | POA: Insufficient documentation

## 2024-05-09 DIAGNOSIS — M545 Low back pain, unspecified: Secondary | ICD-10-CM | POA: Insufficient documentation

## 2024-05-09 DIAGNOSIS — G894 Chronic pain syndrome: Secondary | ICD-10-CM | POA: Insufficient documentation

## 2024-05-09 DIAGNOSIS — M546 Pain in thoracic spine: Secondary | ICD-10-CM | POA: Diagnosis not present

## 2024-05-09 DIAGNOSIS — Z79899 Other long term (current) drug therapy: Secondary | ICD-10-CM | POA: Diagnosis not present

## 2024-05-09 DIAGNOSIS — M542 Cervicalgia: Secondary | ICD-10-CM | POA: Insufficient documentation

## 2024-05-09 DIAGNOSIS — Z5181 Encounter for therapeutic drug level monitoring: Secondary | ICD-10-CM | POA: Insufficient documentation

## 2024-05-09 NOTE — Progress Notes (Signed)
 Subjective:    Patient ID: Rhonda Bradley, female    DOB: 01-27-1966, 58 y.o.   MRN: [id number]  HPI: Rhonda Bradley is a 58 y.o. female whose appointment was changed to virtual visit, she called office reporting she was sick, nauseated and has diarrhea.   I connected with  by a video enabled telemedicine application and verified that I am speaking with the correct person using two identifiers.  Location: Patient: in her home Provider: in the office    I discussed the limitations of evaluation and management by telemedicine and the availability of in person appointments. The patient expressed understanding and agreed to proceed. She states her pain is located in her neck, mid- lower back, reports fibro pain, She  rates her pain 8.Her  current exercise regime is walking and performing stretching exercises.  Rhonda Bradley Morphine equivalent is 90.00 MME.   Last UDS was Performed on 01/16/2024, it was consistent.      Pain Inventory Average Pain 8 Pain Right Now 8 My pain is constant, sharp, burning, dull, stabbing, tingling, and aching  In the last 24 hours, has pain interfered with the following? General activity 10 Relation with others 5 Enjoyment of life 7 What TIME of day is your pain at its worst? morning  and daytime Sleep (in general) Good  Pain is worse with: walking, bending, sitting, inactivity, standing, and some activites Pain improves with: rest, medication, and heat Relief from Meds: 8  Family History  Problem Relation Age of Onset   Breast cancer Maternal Aunt    Allergic rhinitis Mother    Asthma Mother    High blood pressure Mother    High Cholesterol Mother    Depression Mother    Anxiety disorder Mother    Alcoholism Mother    Eczema Father    High blood pressure Father    High Cholesterol Father    Heart disease Father    Depression Father    Liver disease Father    Alcoholism Father    AAA (abdominal aortic aneurysm) Father     Angioedema Maternal Grandfather    Urticaria Maternal Grandfather    Social History   Socioeconomic History   Marital status: Married    Spouse name: Optometrist   Number of children: Not on file   Years of education: Not on file   Highest education level: Not on file  Occupational History   Occupation: Therapist, [employer]  Tobacco Use   Smoking status: Never   Smokeless tobacco: Never  Vaping Use   Vaping status: Never Used  Substance and Sexual Activity   Alcohol use: Yes    Comment: socially    Drug use: No   Sexual activity: Not on file  Other Topics Concern   Not on file  Social History Narrative   Not on file   Social Drivers of Health   Financial Resource Strain: Not on file  Food Insecurity: Low Risk  (08/31/2023)   Received from [facility]   Hunger Vital Sign    Worried About Running Out of Food in the Last Year: Never true    Ran Out of Food in the Last Year: Never true  Transportation Needs: No Transportation Needs (08/31/2023)   Received from [facility]    In the past 12 months, has lack of reliable transportation kept you from medical appointments, meetings, work or from getting things needed for daily living? : No  Physical Activity: Not on file  Stress: Not on file  Social Connections: Not on file   Past Surgical History:  Procedure Laterality Date   BREAST BIOPSY     BREAST EXCISIONAL BIOPSY     CESAREAN SECTION     x 3   CHOLECYSTECTOMY     THYROIDECTOMY N/A 09/19/2014   Procedure: THYROIDECTOMY;  Surgeon: Oralee Billow, MD;  Location: [facility];  Service: General;  Laterality: N/A;   Past Surgical History:  Procedure Laterality Date   BREAST BIOPSY     BREAST EXCISIONAL BIOPSY     CESAREAN SECTION     x 3   CHOLECYSTECTOMY     THYROIDECTOMY N/A 09/19/2014   Procedure: THYROIDECTOMY;  Surgeon: Oralee Billow, MD;  Location: [facility];  Service: General;  Laterality: N/A;   Past Medical History:  Diagnosis  Date   Anemia    as a child    Anxiety    Asthma    Back pain    Chronic fatigue syndrome    Complication of anesthesia    slow to wake up age 43, but since cholecystectomy and no problems    Constipation    Depression    Diabetes (HCC)    Family history of anesthesia complication    father slow to wake up    Fatty liver    Fibromyalgia    GERD (gastroesophageal reflux disease)    Gestational diabetes    Headache    High blood pressure    High cholesterol    Hypertension    IBS (irritable bowel syndrome)    Joint pain    Lactose intolerance    Osteopenia    SOB (shortness of breath)    Ht 5' 7 (1.702 m)   Wt 198 lb (89.8 kg) Comment: PER PATIENT TODAY IS A MYCHART VISIT  BMI 31.01 kg/m   Opioid Risk Score:   Fall Risk Score:  `1  Depression screen PHQ 2/9     04/10/2024    2:25 PM 01/08/2022    2:21 PM 01/08/2022    2:20 PM 12/10/2021   11:10 AM 11/12/2021   11:16 AM 09/17/2021    3:12 PM 07/15/2021   11:12 AM  Depression screen PHQ 2/9  Decreased Interest 1 1 0 1 0 1 2  Down, Depressed, Hopeless 1 1 0 1 0 3 1  PHQ - 2 Score 2 2 0 2 0 4 3  Altered sleeping       3  Tired, decreased energy       3  Change in appetite       3  Feeling bad or failure about yourself        2  Trouble concentrating       1  Moving slowly or fidgety/restless       3  Suicidal thoughts       0  PHQ-9 Score       18  Difficult doing work/chores       Extremely dIfficult    Review of Systems  Musculoskeletal:  Positive for back pain and neck pain.       Pain all over the body  Neurological:  Positive for headaches.  All other systems reviewed and are negative.      Objective:   Physical Exam Vitals and nursing note reviewed.   Musculoskeletal:     Comments: No Physical Exam Performed: Virtual Visit         Assessment & Plan:  Chronic Bilateral Mid-Low-Back Pain without Sciatica: Continue HEP as Tolerated. Continue Current medication regimen. Continue to monitor.  05/09/2024 Right Greater Trochanter Bursitis:No complaints today.  Continue HEP as Tolerated and Continue to Monitor. 06/042025 Bilateral Hand Pain: No complaints today. Continue HEP as Tolerated. Continue to monitor. 05/09/2024 Chronic Pain Syndrome:  Refilled: Oxycodone  10mg  one tablet every 4 hours as needed for pain # 180. We will continue the opioid monitoring program, this consists of regular clinic visits, examinations, urine drug screen, pill counts as well as use of [place]  Controlled Substance Reporting system. A 12 month History has been reviewed on the [place]  Controlled Substance Reporting System on 05/09/2024 Bilateral Feet Pain: No complaints today. Continue HEP as Tolerated. Continue current medication regimen. Continue to monitor. 05/09/2024 6.  Polyarthralgia: Continue HEP as Tolerated. Continue current medication regimen. Continue to Monitor. 05/09/2024     F/U in 1 month  Established Patient Virtual Visit  Location of Patient: At her Home Location of Provider: In the Office

## 2024-05-10 ENCOUNTER — Ambulatory Visit
Admission: RE | Admit: 2024-05-10 | Discharge: 2024-05-10 | Disposition: A | Discharge status: Home / Self Care | Source: Ambulatory Visit | Attending: Internal Medicine | Admitting: Internal Medicine

## 2024-05-10 ENCOUNTER — Ambulatory Visit: Admitting: Registered Nurse

## 2024-05-10 DIAGNOSIS — Z1231 Encounter for screening mammogram for malignant neoplasm of breast: Secondary | ICD-10-CM

## 2024-05-16 DIAGNOSIS — E1165 Type 2 diabetes mellitus with hyperglycemia: Secondary | ICD-10-CM | POA: Diagnosis not present

## 2024-05-22 DIAGNOSIS — E669 Obesity, unspecified: Secondary | ICD-10-CM | POA: Diagnosis not present

## 2024-05-22 DIAGNOSIS — E1165 Type 2 diabetes mellitus with hyperglycemia: Secondary | ICD-10-CM | POA: Diagnosis not present

## 2024-05-22 DIAGNOSIS — F410 Panic disorder [episodic paroxysmal anxiety] without agoraphobia: Secondary | ICD-10-CM | POA: Diagnosis not present

## 2024-05-22 DIAGNOSIS — E890 Postprocedural hypothyroidism: Secondary | ICD-10-CM | POA: Diagnosis not present

## 2024-05-30 ENCOUNTER — Telehealth: Payer: Self-pay | Admitting: Physical Medicine and Rehabilitation

## 2024-05-30 NOTE — Telephone Encounter (Signed)
 P stated pool is helping a lot with her pain and wants to know if qutenza is really needed or not

## 2024-06-19 ENCOUNTER — Encounter: Attending: Physical Medicine and Rehabilitation | Admitting: Physical Medicine and Rehabilitation

## 2024-06-19 ENCOUNTER — Encounter: Payer: Self-pay | Admitting: Physical Medicine and Rehabilitation

## 2024-06-19 VITALS — BP 118/79 | HR 67 | Ht 67.0 in | Wt 198.4 lb

## 2024-06-19 DIAGNOSIS — M797 Fibromyalgia: Secondary | ICD-10-CM | POA: Diagnosis not present

## 2024-06-19 DIAGNOSIS — F410 Panic disorder [episodic paroxysmal anxiety] without agoraphobia: Secondary | ICD-10-CM | POA: Diagnosis not present

## 2024-06-19 DIAGNOSIS — I959 Hypotension, unspecified: Secondary | ICD-10-CM | POA: Diagnosis not present

## 2024-06-19 MED ORDER — PROPRANOLOL HCL 10 MG PO TABS: 10.0000 mg | ORAL_TABLET | Freq: Every day | ORAL | 3 refills | Status: AC

## 2024-06-19 MED ORDER — JOURNAVX 50 MG PO TABS: 1.0000 | ORAL_TABLET | Freq: Every day | ORAL | 0 refills | Status: DC | PRN

## 2024-06-19 NOTE — Patient Instructions (Signed)
 Relaxium

## 2024-06-19 NOTE — Progress Notes (Signed)
 Subjective:    Patient ID: Rhonda Bradley, female    DOB: [birth year], 58 y.o.   MRN: [id number]  HPI  Mrs. Bradham is a 58 year old woman who presents for follow-up of fibromyalgia, chronic fatigue, Hashimoto's.  1) Fibromyalgia which causes chronic pain and fatigue: -she has been doing exercises in her personal pool -she has been feeling really well -fatigue has been especially severe -she is interested in trying Ritalin  -she feels miserable -she feels that her throat is squeezing -she has been having more pain -she is taking oxycodone  three times per day -she was told she can take up to 4,000mg  extra strength tylenol  per day but she tries not to -when she called to make this appointment she was having an infection -she has had pain for many years, and it makes her miserable -she has had benefit from deep tissue massage. 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  -she loves to swim and this helps her  -heat, sometimes ice -she is prescribed diclofenac and oxycodone  and she tries to take these sparingly.  -Has tried Savella, Lyrica , gabapentin, Cymbalta, Seroquel , amitriptyline  without benefit or had side effect. -average pain is 8/10 -had episode of palpitations, and has felt at times like she is going to fall -she takes furosemide for lower extremity leg swelling.  -she used to be an exercise fanatic years ago and she does not know if this has contributed to a herniated discs. -pain worsens with the cold weather -feeling more pan today with the gloomy weather -last time she was here she mentioned to [facility] about knots in her neck.  -she gets myofascial pain in her neck and upper back, her legs, her shoulder blades bilaterally.  -worst on her left side.  -husband afraid of hurting her.  -she sometimes gets tightness to wear she can't move her neck.    2) Type 2 diabetes: -was started on insulin and metformin and other medications were stopped  3) Flu -one night  she had fluctuating hypo and hypertension -she was taking Lantus and a short acting insulin- she took a short acting my accident -she waited an hour and then took the long acting -she took her opioid and tylenol  and tizanidine -she was aching in a different way  4) Low back pain: -she has been having more spasms -discussed XR results  5) Panic attacks: -went to the hospital a year ago because she thought she was having a heart attack -she had gone to church and that is when it happened  Pain Inventory Average Pain 8 Pain Right Now 6 My pain is constant, sharp, burning, stabbing, tingling, and aching  In the last 24 hours, has pain interfered with the following? General activity 5 Relation with others 3 Enjoyment of life 5 What TIME of day is your pain at its worst? daytime Sleep (in general) Fair  Pain is worse with: walking, bending, sitting, inactivity, standing, and some activites Pain improves with: rest, eat, medication Relief from Meds: 8     Family History  Problem Relation Age of Onset   Breast cancer Maternal Aunt    Allergic rhinitis Mother    Asthma Mother    High blood pressure Mother    High Cholesterol Mother    Depression Mother    Anxiety disorder Mother    Alcoholism Mother    Eczema Father    High blood pressure Father    High Cholesterol Father    Heart disease  Father    Depression Father    Liver disease Father    Alcoholism Father    AAA (abdominal aortic aneurysm) Father    Angioedema Maternal Grandfather    Urticaria Maternal Grandfather    Social History   Socioeconomic History   Marital status: Married    Spouse name: Optometrist   Number of children: Not on file   Years of education: Not on file   Highest education level: Not on file  Occupational History   Occupation: Therapist, [employer]  Tobacco Use   Smoking status: Never   Smokeless tobacco: Never  Vaping Use   Vaping status: Never Used  Substance  and Sexual Activity   Alcohol use: Yes    Comment: socially    Drug use: No   Sexual activity: Not on file  Other Topics Concern   Not on file  Social History Narrative   Not on file   Social Drivers of Health   Financial Resource Strain: Not on file  Food Insecurity: Low Risk  (08/31/2023)   Received from [facility]   Hunger Vital Sign    Within the past 12 months, you worried that your food would run out before you got money to buy more: Never true    Within the past 12 months, the food you bought just didn't last and you didn't have money to get more. : Never true  Transportation Needs: No Transportation Needs (08/31/2023)   Received from [facility]    In the past 12 months, has lack of reliable transportation kept you from medical appointments, meetings, work or from getting things needed for daily living? : No  Physical Activity: Not on file  Stress: Not on file  Social Connections: Not on file   Past Surgical History:  Procedure Laterality Date   BREAST BIOPSY     BREAST EXCISIONAL BIOPSY     CESAREAN SECTION     x 3   CHOLECYSTECTOMY     THYROIDECTOMY N/A 09/19/2014   Procedure: THYROIDECTOMY;  Surgeon: Krystal Spinner, MD;  Location: [facility];  Service: General;  Laterality: N/A;   Past Medical History:  Diagnosis Date   Anemia    as a child    Anxiety    Asthma    Back pain    Chronic fatigue syndrome    Complication of anesthesia    slow to wake up age 58, but since cholecystectomy and no problems    Constipation    Depression    Diabetes (HCC)    Family history of anesthesia complication    father slow to wake up    Fatty liver    Fibromyalgia    GERD (gastroesophageal reflux disease)    Gestational diabetes    Headache    High blood pressure    High cholesterol    Hypertension    IBS (irritable bowel syndrome)    Joint pain    Lactose intolerance    Osteopenia    SOB (shortness of breath)    BP 118/79 (BP Location: Left Arm,  Patient Position: Sitting, Cuff Size: Large)   Pulse 67   Ht 5' 7 (1.702 m)   Wt 198 lb 6.4 oz (90 kg)   SpO2 96%   BMI 31.07 kg/m   Opioid Risk Score:   Fall Risk Score:  `1  Depression screen [facility] 2/9     06/19/2024    2:32 PM 05/09/2024    1:08  PM 04/10/2024    2:25 PM 01/08/2022    2:21 PM 01/08/2022    2:20 PM 12/10/2021   11:10 AM 11/12/2021   11:16 AM  Depression screen PHQ 2/9  Decreased Interest 0 1 1 1  0 1 0  Down, Depressed, Hopeless 0 1 1 1  0 1 0  PHQ - 2 Score 0 2 2 2  0 2 0       Review of Systems  Constitutional:  Negative for unexpected weight change.  HENT: Negative.    Eyes: Negative.   Respiratory:  Positive for wheezing.   Cardiovascular: Negative.   Gastrointestinal:  Negative for constipation, diarrhea and nausea.  Endocrine: Negative.   Genitourinary: Negative.   Musculoskeletal:  Positive for back pain, gait problem and neck pain.       Pain in riht and left leg , pain in both arms,   Skin: Negative.   Allergic/Immunologic: Negative.   Neurological:  Positive for weakness and headaches. Negative for numbness.  Hematological: Negative.   Psychiatric/Behavioral:  Positive for dysphoric mood. The patient is not nervous/anxious.        Anxiety  All other systems reviewed and are negative.      Objective:   Physical Exam Gen: no distress, normal appearing, weight 196 lbs, BMI 30.70, BP 106/71 HEENT: oral mucosa pink and moist, NCAT Cardio: Reg rate Chest: normal effort, normal rate of breathing Abd: soft, non-distended Ext: no edema Psych: pleasant, normal affect Skin: intact Neuro: Alert and oriented x3 Musculoskeletal: Diffuse pain on both sides of her body and in multiple joints. Tight cervical myofascia. Decreased range of motion and pain , improved 7/15     Assessment & Plan:   1) Fibromyalgia: There is presence of widespread pain above and below the waist on both sides for more than 3 months that is consistent with fibromyalgia. There are  related symptoms of fatigue, cognitive dysfunction, unrefreshing sleep, anxiety, and depression. Educated that both genetics and environment can impact fibromyalgia. -reviewed her current medications -discussed that Journavx  is a highly selective inhibitor for Nav 1.8, which is specific for pain in the peripheral nervous system, discussed that lidocaine in contrast affects all Nav receptors, discussed that patient can try using this medication as prn for pain, though its studies have focused on its use for acute pain. Discussed that it has been studied against opioids for acute pain with comparable efficacy. Discussed that I have not seen any patient side effects thus far. Discussed that we have samples available and we have copay cards available. Discussed that outpatient if the medication requires a prior auth the copay should be $30 for at least a 60 day supply. The medication may be more likely to be in stock in [facility] and [facility]. We do have samples available.  -continue exercising in her personal pool -discussed her fatigue, discussed Ritalin , discussed elimination diet rich in fruits and vegetables, discussed that antibody testing for stiffperson syndrom was negative -discussed Lyrica  but she has failed in the past -discussed that she feels miserable due to her pain -decrease oxycodone  to 10mg  TID PRN -discussed that Lyrica  was not helping, and that it caused her to have brain fog and fatigue -discussed that sunlight exposure helps her feel better Discussed that massage could be helpful -discussed tens unit Prescribing Home Zynex NexWave Stimulator Device and supplies as needed. IFC, NMES and TENS medically necessary Treatment Rx: Daily @ 30-40 minutes per treatment PRN. Zynex NexWave only, no substitutions. Treatment Goals: 1) To reduce and/or  eliminate pain 2) To improve functional capacity and Activities of daily living 3) To reduce or prevent the need for oral medications 4) To improve  circulation in the injured region 5) To decrease or prevent muscle spasm and muscle atrophy 6) To provide a self-management tool to the patient The patient has not sufficiently improved with conservative care. Numerous studies indexed by Medline and PubMed.gov have shown Neuromuscular, Interferential, and TENS stimulators to reduce pain, improve function, and reduce medication use in injured patients. Continued use of this evidence based, safe, drug free treatment is both reasonable and medically necessary at this time.  -discussed the importance of exercise -UDS obtained and contains expected metabolites. Discussed risks and benefits of oxycodone  and prescribed Percocet 5mg  BID PRN. Repeat UDS today.  -discussed side effects of NSAIDs.  -discussed side effects of oxycodone , increase to TID PRN.  -Bacopa recommended.  -recommended taking NAC 600mg  BID.  -continued massage, discussed ts other benefits.  -Discussed current symptoms of pain and history of pain.  -Discussed benefits of exercise in reducing pain. -Discussed following foods that may reduce pain: 1) Ginger (especially studied for arthritis)- reduce leukotriene production to decrease inflammation 2) Blueberries- high in phytonutrients that decrease inflammation 3) Salmon- marine omega-3s reduce joint swelling and pain 4) Pumpkin seeds- reduce inflammation 5) dark chocolate- reduces inflammation 6) turmeric- reduces inflammation 7) tart cherries - reduce pain and stiffness 8) extra virgin olive oil - its compound olecanthal helps to block prostaglandins  9) chili peppers- can be eaten or applied topically via capsaicin 10) mint- helpful for headache, muscle aches, joint pain, and itching 11) garlic- reduces inflammation  Link to further information on diet for chronic pain: [email or web address]   2) Stomach pain- has been diagnosed with IBS.  -advised against  using the premier protein.   3) groin pain: -radiating from low back.   4) chronic cough -low dose prednisone helped for some time.   5) Cervical myofascial pain syndrome -referred for therapy for dry needling,  -trigger point injections.   6) Type 2 diabetes: -continue insulin -continue nuts, salmon, fish, chicken -check CBGs daily, log, and bring log to follow-up appointment -avoid sugar, bread, pasta, rice -avoid snacking -perform daily foot exam and at least annual eye exam -try to incorporate into your diet some of the following foods which are good for diabetes: 1) cinnamon- imitates effects of insulin, increasing glucose transport into cells ([place] or [place] cinnamon is best, least processed) 2) nuts- can slow down the blood sugar response of carbohydrate rich foods 3) oatmeal- contains and anti-inflammatory compound avenanthramide 4) whole-milk yogurt (best types are no sugar, [place] yogurt, or goat/sheep yogurt) 5) beans- high in protein, fiber, and vitamins, low glycemic index 6) broccoli- great source of vitamin A and C 7) quinoa- higher in protein and fiber than other grains 8) spinach- high in vitamin A, fiber, and protein 9) olive oil- reduces glucose levels, LDL, and triglycerides 10) salmon- excellent amount of omega-3-fatty acids 11) walnuts- rich in antioxidants 12) apples- high in fiber and quercetin 13) carrots- highly nutritious with low impact on blood sugar 14) eggs- improve HDL (good cholesterol), high in protein, keep you satiated 15) turmeric: improves blood sugars, cardiovascular disease, and protects kidney health 16) garlic: improves blood sugar, blood pressure, pain 17) tomatoes: highly nutritious with low impact on blood sugar   8) Migraines: -encouraged avoiding gluten  9) Hungry: -encouraged drinking tea between the meals  10) Flu: -discussed the medications she took the night she had  a flu and how she woke up and her body was cold-  frozen stiff and she had to work to get her fingers open -discussed that she got off her short acting  11) Low back pain : XR ordered  -Discussed Qutenza as an option for neuropathic pain control. Discussed that this is a capsaicin patch, stronger than capsaicin cream. Discussed that it is currently approved for diabetic peripheral neuropathy and post-herpetic neuralgia, but that it has also shown benefit in treating other forms of neuropathy. Provided patient with link to site to learn more about the patch: https://www.clark.biz/. Discussed that the patch would be placed in office and benefits usually last 3 months. Discussed that unintended exposure to capsaicin can cause severe irritation of eyes, mucous membranes, respiratory tract, and skin, but that Qutenza is a local treatment and does not have the systemic side effects of other nerve medications. Discussed that there may be pain, itching, erythema, and decreased sensory function associated with the application of Qutenza. Side effects usually subside within 1 week. A cold pack of analgesic medications can help with these side effects. Blood pressure can also be increased due to pain associated with administration of the patch.   12) Thoracic spine pain: XR ordered  13) Cervical spine pain: -discussed that XR shows disc degeneration  14) Neuropathy -Discussed Qutenza as an option for neuropathic pain control. Discussed that this is a capsaicin patch, stronger than capsaicin cream. Discussed that it is currently approved for diabetic peripheral neuropathy and post-herpetic neuralgia, but that it has also shown benefit in treating other forms of neuropathy. Provided patient with link to site to learn more about the patch: https://www.clark.biz/. Discussed that the patch would be placed in office and benefits usually last 3 months. Discussed that unintended exposure to capsaicin can cause severe irritation of eyes, mucous membranes,  respiratory tract, and skin, but that Qutenza is a local treatment and does not have the systemic side effects of other nerve medications. Discussed that there may be pain, itching, erythema, and decreased sensory function associated with the application of Qutenza. Side effects usually subside within 1 week. A cold pack of analgesic medications can help with these side effects. Blood pressure can also be increased due to pain associated with administration of the patch.   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 Panic attack: -replace avapro with propanolol daily -recommended Relaxium at night  17. Tremor: -replace avapro with propanolol  18. Insomnia:  -recommended relaxium at night  19. Hypotension:  -recommended staggering blood pressure medications and lasix -discussed that she is not taking metoprolol , took this in the past

## 2024-06-20 ENCOUNTER — Encounter: Payer: Self-pay | Admitting: Physical Medicine and Rehabilitation

## 2024-06-22 ENCOUNTER — Other Ambulatory Visit: Payer: Self-pay | Admitting: Adult Health

## 2024-06-22 DIAGNOSIS — G4700 Insomnia, unspecified: Secondary | ICD-10-CM

## 2024-07-11 DIAGNOSIS — R102 Pelvic and perineal pain: Secondary | ICD-10-CM | POA: Diagnosis not present

## 2024-07-11 DIAGNOSIS — R300 Dysuria: Secondary | ICD-10-CM | POA: Diagnosis not present

## 2024-07-18 ENCOUNTER — Encounter: Payer: Self-pay | Admitting: Registered Nurse

## 2024-07-18 ENCOUNTER — Encounter: Attending: Physical Medicine and Rehabilitation | Admitting: Registered Nurse

## 2024-07-18 VITALS — BP 131/82 | HR 80 | Ht 67.0 in | Wt 196.0 lb

## 2024-07-18 DIAGNOSIS — M5412 Radiculopathy, cervical region: Secondary | ICD-10-CM | POA: Insufficient documentation

## 2024-07-18 DIAGNOSIS — M546 Pain in thoracic spine: Secondary | ICD-10-CM | POA: Insufficient documentation

## 2024-07-18 DIAGNOSIS — Z5181 Encounter for therapeutic drug level monitoring: Secondary | ICD-10-CM | POA: Diagnosis not present

## 2024-07-18 DIAGNOSIS — G894 Chronic pain syndrome: Secondary | ICD-10-CM | POA: Insufficient documentation

## 2024-07-18 DIAGNOSIS — M797 Fibromyalgia: Secondary | ICD-10-CM | POA: Diagnosis not present

## 2024-07-18 DIAGNOSIS — M545 Low back pain, unspecified: Secondary | ICD-10-CM | POA: Diagnosis not present

## 2024-07-18 DIAGNOSIS — M542 Cervicalgia: Secondary | ICD-10-CM | POA: Diagnosis not present

## 2024-07-18 DIAGNOSIS — Z79899 Other long term (current) drug therapy: Secondary | ICD-10-CM | POA: Insufficient documentation

## 2024-07-18 NOTE — Progress Notes (Signed)
 Subjective:    Patient ID: Rhonda Bradley, female    DOB: [birth year], 58 y.o.   MRN: [id number]  HPI: Rhonda Bradley is a 58 y.o. female who returns for follow up appointment for chronic pain and medication refill. She states her pain is located in her neck radiating into her bilateral shoulders, mid- back and bilateral knee pain. She rates her pain 7. Her current exercise regime is walkin, pool therapy 2- 3 times a week and performing stretching exercises.  Rhonda Bradley Morphine equivalent is 90.00 MME.   UDS ordered today.    Pain Inventory Average Pain 7 Pain Right Now 7 My pain is burning, tingling, and aching  In the last 24 hours, has pain interfered with the following? General activity 4 Relation with others 5 Enjoyment of life 7 What TIME of day is your pain at its worst? morning , daytime, and night Sleep (in general) Fair  Pain is worse with: walking, bending, sitting, inactivity, and standing Pain improves with: rest, heat/ice, and medication Relief from Meds: 9  Family History  Problem Relation Age of Onset   Breast cancer Maternal Aunt    Allergic rhinitis Mother    Asthma Mother    High blood pressure Mother    High Cholesterol Mother    Depression Mother    Anxiety disorder Mother    Alcoholism Mother    Eczema Father    High blood pressure Father    High Cholesterol Father    Heart disease Father    Depression Father    Liver disease Father    Alcoholism Father    AAA (abdominal aortic aneurysm) Father    Angioedema Maternal Grandfather    Urticaria Maternal Grandfather    Social History   Socioeconomic History   Marital status: Married    Spouse name: Optometrist   Number of children: Not on file   Years of education: Not on file   Highest education level: Not on file  Occupational History   Occupation: Therapist, [employer]  Tobacco Use   Smoking status: Never   Smokeless tobacco: Never  Vaping Use   Vaping  status: Never Used  Substance and Sexual Activity   Alcohol use: Yes    Comment: socially    Drug use: No   Sexual activity: Not on file  Other Topics Concern   Not on file  Social History Narrative   Not on file   Social Drivers of Health   Financial Resource Strain: Not on file  Food Insecurity: Low Risk  (08/31/2023)   Received from [facility]   Hunger Vital Sign    Within the past 12 months, you worried that your food would run out before you got money to buy more: Never true    Within the past 12 months, the food you bought just didn't last and you didn't have money to get more. : Never true  Transportation Needs: No Transportation Needs (08/31/2023)   Received from [facility]    In the past 12 months, has lack of reliable transportation kept you from medical appointments, meetings, work or from getting things needed for daily living? : No  Physical Activity: Not on file  Stress: Not on file  Social Connections: Not on file   Past Surgical History:  Procedure Laterality Date   BREAST BIOPSY     BREAST EXCISIONAL BIOPSY     CESAREAN SECTION     x 3  CHOLECYSTECTOMY     THYROIDECTOMY N/A 09/19/2014   Procedure: THYROIDECTOMY;  Surgeon: Krystal Spinner, MD;  Location: [facility];  Service: General;  Laterality: N/A;   Past Surgical History:  Procedure Laterality Date   BREAST BIOPSY     BREAST EXCISIONAL BIOPSY     CESAREAN SECTION     x 3   CHOLECYSTECTOMY     THYROIDECTOMY N/A 09/19/2014   Procedure: THYROIDECTOMY;  Surgeon: Krystal Spinner, MD;  Location: [facility];  Service: General;  Laterality: N/A;   Past Medical History:  Diagnosis Date   Anemia    as a child    Anxiety    Asthma    Back pain    Chronic fatigue syndrome    Complication of anesthesia    slow to wake up age 69, but since cholecystectomy and no problems    Constipation    Depression    Diabetes (HCC)    Family history of anesthesia complication    father slow to wake up     Fatty liver    Fibromyalgia    GERD (gastroesophageal reflux disease)    Gestational diabetes    Headache    High blood pressure    High cholesterol    Hypertension    IBS (irritable bowel syndrome)    Joint pain    Lactose intolerance    Osteopenia    SOB (shortness of breath)    BP 131/82   Pulse 80   Ht 5' 7 (1.702 m)   Wt 196 lb (88.9 kg)   SpO2 94%   BMI 30.70 kg/m   Opioid Risk Score:   Fall Risk Score:  `1  Depression screen PHQ 2/9     07/18/2024    2:21 PM 06/19/2024    2:32 PM 05/09/2024    1:08 PM 04/10/2024    2:25 PM 01/08/2022    2:21 PM 01/08/2022    2:20 PM 12/10/2021   11:10 AM  Depression screen PHQ 2/9  Decreased Interest 1 0 1 1 1  0 1  Down, Depressed, Hopeless 1 0 1 1 1  0 1  PHQ - 2 Score 2 0 2 2 2  0 2    Review of Systems  Musculoskeletal:  Positive for back pain.  All other systems reviewed and are negative.      Objective:   Physical Exam Vitals and nursing note reviewed.  Constitutional:      Appearance: Normal appearance.  Neck:     Comments: Cervical Paraspinal Tenderness: C-5-C-6 Cardiovascular:     Rate and Rhythm: Normal rate and regular rhythm.  Pulmonary:     Effort: Pulmonary effort is normal.     Breath sounds: Normal breath sounds.  Musculoskeletal:     Comments: Normal Muscle Bulk and Muscle Testing Reveals:  Upper Extremities: Full ROM and Muscle Strength 5/5 Bilateral AC Joint tenderness Thoracic and Lumbar Hypersensitivity Bilateral Greater Trochanter tenderness Lower Extremities : Full ROM and Muscle Strength 5/5 Bilateral Lower Extremities Flexion Produces Pain into her Bilateral Patella's Arises from chair with ease Narrow Based Gait     Skin:    General: Skin is warm and dry.  Neurological:     Mental Status: She is alert and oriented to person, place, and time.  Psychiatric:        Mood and Affect: Mood normal.        Behavior: Behavior normal.          Assessment & Plan:  Cervicalgia/ Cervical  Radiculitis:  Continue HEP as tolerated. Continue to monitor.  2.Chronic Bilateral Mid-Low-Back Pain without Sciatica: Continue HEP as Tolerated. Continue Current medication regimen. Continue to monitor. 07/18/2024 3.Fibromyalgia: Continue HEP as Tolerated. Continue to monitor. 07/18/2024 4. Chronic Pain Syndrome:  Refilled: Oxycodone  10mg  one tablet every 4 hours as needed for pain # 180. We will continue the opioid monitoring program, this consists of regular clinic visits, examinations, urine drug screen, pill counts as well as use of [place]  Controlled Substance Reporting system. A 12 month History has been reviewed on the [place]  Controlled Substance Reporting System on 07/18/2024     F/U in 1 month

## 2024-07-19 ENCOUNTER — Other Ambulatory Visit: Payer: Self-pay | Admitting: Physical Medicine and Rehabilitation

## 2024-07-20 DIAGNOSIS — R102 Pelvic and perineal pain: Secondary | ICD-10-CM | POA: Diagnosis not present

## 2024-07-20 DIAGNOSIS — Z9049 Acquired absence of other specified parts of digestive tract: Secondary | ICD-10-CM | POA: Diagnosis not present

## 2024-07-20 DIAGNOSIS — K5730 Diverticulosis of large intestine without perforation or abscess without bleeding: Secondary | ICD-10-CM | POA: Diagnosis not present

## 2024-07-21 LAB — TOXASSURE SELECT,+ANTIDEPR,UR

## 2024-08-02 DIAGNOSIS — Z0000 Encounter for general adult medical examination without abnormal findings: Secondary | ICD-10-CM | POA: Diagnosis not present

## 2024-08-02 DIAGNOSIS — E7800 Pure hypercholesterolemia, unspecified: Secondary | ICD-10-CM | POA: Diagnosis not present

## 2024-08-02 DIAGNOSIS — E039 Hypothyroidism, unspecified: Secondary | ICD-10-CM | POA: Diagnosis not present

## 2024-08-02 DIAGNOSIS — E559 Vitamin D deficiency, unspecified: Secondary | ICD-10-CM | POA: Diagnosis not present

## 2024-08-02 DIAGNOSIS — F3341 Major depressive disorder, recurrent, in partial remission: Secondary | ICD-10-CM | POA: Diagnosis not present

## 2024-08-02 DIAGNOSIS — E1169 Type 2 diabetes mellitus with other specified complication: Secondary | ICD-10-CM | POA: Diagnosis not present

## 2024-08-02 DIAGNOSIS — M797 Fibromyalgia: Secondary | ICD-10-CM | POA: Diagnosis not present

## 2024-08-02 DIAGNOSIS — I10 Essential (primary) hypertension: Secondary | ICD-10-CM | POA: Diagnosis not present

## 2024-08-02 DIAGNOSIS — R419 Unspecified symptoms and signs involving cognitive functions and awareness: Secondary | ICD-10-CM | POA: Diagnosis not present

## 2024-08-02 DIAGNOSIS — M5030 Other cervical disc degeneration, unspecified cervical region: Secondary | ICD-10-CM | POA: Diagnosis not present

## 2024-08-06 ENCOUNTER — Other Ambulatory Visit: Payer: Self-pay | Admitting: Physical Medicine and Rehabilitation

## 2024-08-15 ENCOUNTER — Telehealth: Payer: Self-pay

## 2024-08-15 NOTE — Telephone Encounter (Signed)
 Coupon faxed to [facility] to process prescription for Journavx  50 mg

## 2024-08-15 NOTE — Telephone Encounter (Signed)
 Faxed coupon for Journavx  with instructions how to process coupon.  [facility] to [facility] since insurance will not cover medication.

## 2024-08-17 DIAGNOSIS — J069 Acute upper respiratory infection, unspecified: Secondary | ICD-10-CM | POA: Diagnosis not present

## 2024-08-17 DIAGNOSIS — U071 COVID-19: Secondary | ICD-10-CM | POA: Diagnosis not present

## 2024-08-17 DIAGNOSIS — R059 Cough, unspecified: Secondary | ICD-10-CM | POA: Diagnosis not present

## 2024-08-17 DIAGNOSIS — J029 Acute pharyngitis, unspecified: Secondary | ICD-10-CM | POA: Diagnosis not present

## 2024-08-24 DIAGNOSIS — L299 Pruritus, unspecified: Secondary | ICD-10-CM | POA: Diagnosis not present

## 2024-08-24 DIAGNOSIS — E1165 Type 2 diabetes mellitus with hyperglycemia: Secondary | ICD-10-CM | POA: Diagnosis not present

## 2024-08-24 DIAGNOSIS — I10 Essential (primary) hypertension: Secondary | ICD-10-CM | POA: Diagnosis not present

## 2024-08-24 DIAGNOSIS — E890 Postprocedural hypothyroidism: Secondary | ICD-10-CM | POA: Diagnosis not present

## 2024-08-24 DIAGNOSIS — E669 Obesity, unspecified: Secondary | ICD-10-CM | POA: Diagnosis not present

## 2024-08-30 ENCOUNTER — Encounter: Payer: Self-pay | Admitting: Registered Nurse

## 2024-08-30 ENCOUNTER — Encounter: Payer: Self-pay | Admitting: Physical Medicine and Rehabilitation

## 2024-08-30 ENCOUNTER — Encounter: Attending: Physical Medicine and Rehabilitation | Admitting: Registered Nurse

## 2024-08-30 VITALS — BP 101/70 | HR 72 | Ht 67.0 in | Wt 197.0 lb

## 2024-08-30 DIAGNOSIS — M546 Pain in thoracic spine: Secondary | ICD-10-CM | POA: Insufficient documentation

## 2024-08-30 DIAGNOSIS — M5412 Radiculopathy, cervical region: Secondary | ICD-10-CM | POA: Diagnosis not present

## 2024-08-30 DIAGNOSIS — M542 Cervicalgia: Secondary | ICD-10-CM | POA: Diagnosis not present

## 2024-08-30 DIAGNOSIS — G894 Chronic pain syndrome: Secondary | ICD-10-CM | POA: Insufficient documentation

## 2024-08-30 DIAGNOSIS — Z5181 Encounter for therapeutic drug level monitoring: Secondary | ICD-10-CM | POA: Diagnosis not present

## 2024-08-30 DIAGNOSIS — M797 Fibromyalgia: Secondary | ICD-10-CM | POA: Insufficient documentation

## 2024-08-30 DIAGNOSIS — Z79899 Other long term (current) drug therapy: Secondary | ICD-10-CM | POA: Insufficient documentation

## 2024-08-30 DIAGNOSIS — M545 Low back pain, unspecified: Secondary | ICD-10-CM | POA: Diagnosis not present

## 2024-08-30 MED ORDER — OXYCODONE HCL 10 MG PO TABS: 10.0000 mg | ORAL_TABLET | ORAL | 0 refills | Status: DC | PRN

## 2024-08-30 NOTE — Progress Notes (Signed)
 Subjective:    Patient ID: Rhonda Bradley, female    DOB: Sep 14, 1966, 58 y.o.   MRN: [id number]  HPI: Rhonda Bradley is a 57 y.o. female who returns for follow up appointment for chronic pain and medication refill. She states her pain is located in her neck radiating into her bilateral shoulders, upper- lower back and reports generalized pain and Fibro pain. She rates her pain 8. Her current exercise regime is walking and performing stretching exercises.  [name] Morphine equivalent is 90.00 MME.   Last UDS was Performed on 07/18/2024, it was consistent.     Pain Inventory Average Pain 10 Pain Right Now 8 My pain is constant, sharp, dull, stabbing, tingling and aching  In the last 24 hours, has pain interfered with the following? General activity 8 Relation with others 8 Enjoyment of life 5 What TIME of day is your pain at its worst? morning , daytime, and night Sleep (in general) Fair  Pain is worse with: walking, bending, sitting, inactivity, standing, and some activites Pain improves with: rest, heat/ice, therapy/exercise, pacing activities, medication, and hot baths and pool Relief from Meds: 9  Family History  Problem Relation Age of Onset   Breast cancer Maternal Aunt    Allergic rhinitis Mother    Asthma Mother    High blood pressure Mother    High Cholesterol Mother    Depression Mother    Anxiety disorder Mother    Alcoholism Mother    Eczema Father    High blood pressure Father    High Cholesterol Father    Heart disease Father    Depression Father    Liver disease Father    Alcoholism Father    AAA (abdominal aortic aneurysm) Father    Angioedema Maternal Grandfather    Urticaria Maternal Grandfather    Social History   Socioeconomic History   Marital status: Married    Spouse name: Optometrist   Number of children: Not on file   Years of education: Not on file   Highest education level: Not on file  Occupational History   Occupation: Film/video editor [employer]  Tobacco Use   Smoking status: Never   Smokeless tobacco: Never  Vaping Use   Vaping status: Never Used  Substance and Sexual Activity   Alcohol use: Yes    Comment: socially    Drug use: No   Sexual activity: Not on file  Other Topics Concern   Not on file  Social History Narrative   Not on file   Social Drivers of Health   Financial Resource Strain: Not on file  Food Insecurity: Low Risk  (08/31/2023)   Received from [facility]   Hunger Vital Sign    Within the past 12 months, you worried that your food would run out before you got money to buy more: Never true    Within the past 12 months, the food you bought just didn't last and you didn't have money to get more. : Never true  Transportation Needs: No Transportation Needs (08/31/2023)   Received from [facility]    In the past 12 months, has lack of reliable transportation kept you from medical appointments, meetings, work or from getting things needed for daily living? : No  Physical Activity: Not on file  Stress: Not on file  Social Connections: Not on file   Past Surgical History:  Procedure Laterality Date   BREAST BIOPSY  BREAST EXCISIONAL BIOPSY     CESAREAN SECTION     x 3   CHOLECYSTECTOMY     THYROIDECTOMY N/A 09/19/2014   Procedure: THYROIDECTOMY;  Surgeon: Krystal Spinner, MD;  Location: [facility];  Service: General;  Laterality: N/A;   Past Surgical History:  Procedure Laterality Date   BREAST BIOPSY     BREAST EXCISIONAL BIOPSY     CESAREAN SECTION     x 3   CHOLECYSTECTOMY     THYROIDECTOMY N/A 09/19/2014   Procedure: THYROIDECTOMY;  Surgeon: Krystal Spinner, MD;  Location: [facility];  Service: General;  Laterality: N/A;   Past Medical History:  Diagnosis Date   Anemia    as a child    Anxiety    Asthma    Back pain    Chronic fatigue syndrome    Complication of anesthesia    slow to wake up age 81, but since cholecystectomy and no problems     Constipation    Depression    Diabetes (HCC)    Family history of anesthesia complication    father slow to wake up    Fatty liver    Fibromyalgia    GERD (gastroesophageal reflux disease)    Gestational diabetes    Headache    High blood pressure    High cholesterol    Hypertension    IBS (irritable bowel syndrome)    Joint pain    Lactose intolerance    Osteopenia    SOB (shortness of breath)    BP 101/70 (BP Location: Left Arm, Patient Position: Sitting, Cuff Size: Large)   Pulse 72   Ht 5' 7 (1.702 m)   Wt 197 lb (89.4 kg)   SpO2 95%   BMI 30.85 kg/m   Opioid Risk Score:   Fall Risk Score:  `1  Depression screen PHQ 2/9     07/18/2024    2:21 PM 06/19/2024    2:32 PM 05/09/2024    1:08 PM 04/10/2024    2:25 PM 01/08/2022    2:21 PM 01/08/2022    2:20 PM 12/10/2021   11:10 AM  Depression screen PHQ 2/9  Decreased Interest 1 0 1 1 1  0 1  Down, Depressed, Hopeless 1 0 1 1 1  0 1  PHQ - 2 Score 2 0 2 2 2  0 2     Review of Systems  Musculoskeletal:  Positive for arthralgias, back pain and myalgias.       Upper and lower back pain, bilateral arm, leg, knee arm pain.  Patient reports having widespread pain  All other systems reviewed and are negative.      Objective:   Physical Exam Vitals and nursing note reviewed.  Constitutional:      Appearance: Normal appearance.  Neck:     Comments: Cervical Paraspinal Tenderness: C-5-C-6 Cardiovascular:     Rate and Rhythm: Normal rate and regular rhythm.     Pulses: Normal pulses.     Heart sounds: Normal heart sounds.  Pulmonary:     Effort: Pulmonary effort is normal.     Breath sounds: Normal breath sounds.  Musculoskeletal:     Comments: Normal Muscle Bulk and Muscle Testing Reveals:  Upper Extremities:Full  ROM and Muscle Strength 5/5 Bilateral AC Joint Tenderness  Thoracic and Lumbar Hypersensitivity Bilateral Greater Trochanter Tenderness Lower Extremities: Full ROM and Muscle strength 5/5 Bilateral Lower  Extremities Flexion Produces Pain into her Bilateral Lower Extremities Arises from Table Slowly Narrow Based  Gait  Skin:    General: Skin is warm and dry.  Neurological:     Mental Status: She is alert and oriented to person, place, and time.  Psychiatric:        Mood and Affect: Mood normal.        Behavior: Behavior normal.          Assessment & Plan:  Cervicalgia/ Cervical Radiculitis: Continue HEP as tolerated. Continue to monitor. 08/30/2024 2.Chronic Bilateral Mid-Low-Back Pain without Sciatica: Continue HEP as Tolerated. Continue Current medication regimen. Continue to monitor. 08/30/2024 3.Fibromyalgia: Continue HEP as Tolerated. Continue to monitor. 08/30/2024 4. Chronic Pain Syndrome: Continue   Journvax. Refilled:  Oxycodone  10mg  one tablet every 4 hours as needed for pain # 100. We will continue the opioid monitoring program, this consists of regular clinic visits, examinations, urine drug screen, pill counts as well as use of [place]  Controlled Substance Reporting system. A 12 month History has been reviewed on the [place]  Controlled Substance Reporting System on 08/30/2024     F/U in 1 month

## 2024-09-05 ENCOUNTER — Telehealth: Payer: Self-pay

## 2024-09-05 NOTE — Telephone Encounter (Signed)
 Called and left voicemail for patient to make aware that insurance will only cover a 7 day supply of pain medication.  Per pharmacy patient can pay out of pocket if needed.  Will attempt to get a PA to override but, plan only allows 7 day supply.

## 2024-09-06 ENCOUNTER — Other Ambulatory Visit: Payer: Self-pay | Admitting: Registered Nurse

## 2024-09-06 ENCOUNTER — Telehealth: Payer: Self-pay | Admitting: Registered Nurse

## 2024-09-06 MED ORDER — JOURNAVX 50 MG PO TABS: 1.0000 | ORAL_TABLET | Freq: Every day | ORAL | 0 refills | Status: DC | PRN

## 2024-09-06 NOTE — Telephone Encounter (Signed)
 Return Ms. Manny call, no answer. Left message to return the call.  Journavx  e-scribed to pharmacy.

## 2024-09-10 DIAGNOSIS — N3020 Other chronic cystitis without hematuria: Secondary | ICD-10-CM | POA: Diagnosis not present

## 2024-09-10 DIAGNOSIS — R399 Unspecified symptoms and signs involving the genitourinary system: Secondary | ICD-10-CM | POA: Diagnosis not present

## 2024-09-10 DIAGNOSIS — N3010 Interstitial cystitis (chronic) without hematuria: Secondary | ICD-10-CM | POA: Diagnosis not present

## 2024-09-24 ENCOUNTER — Other Ambulatory Visit: Payer: Self-pay | Admitting: Adult Health

## 2024-09-24 ENCOUNTER — Encounter: Attending: Physical Medicine and Rehabilitation | Admitting: Physical Medicine and Rehabilitation

## 2024-09-24 VITALS — BP 110/74 | HR 69 | Wt 199.8 lb

## 2024-09-24 DIAGNOSIS — M545 Low back pain, unspecified: Secondary | ICD-10-CM | POA: Diagnosis not present

## 2024-09-24 DIAGNOSIS — M542 Cervicalgia: Secondary | ICD-10-CM | POA: Insufficient documentation

## 2024-09-24 DIAGNOSIS — G4701 Insomnia due to medical condition: Secondary | ICD-10-CM | POA: Diagnosis not present

## 2024-09-24 DIAGNOSIS — M797 Fibromyalgia: Secondary | ICD-10-CM | POA: Insufficient documentation

## 2024-09-24 DIAGNOSIS — G894 Chronic pain syndrome: Secondary | ICD-10-CM | POA: Insufficient documentation

## 2024-09-24 DIAGNOSIS — M546 Pain in thoracic spine: Secondary | ICD-10-CM | POA: Insufficient documentation

## 2024-09-24 DIAGNOSIS — G4700 Insomnia, unspecified: Secondary | ICD-10-CM

## 2024-09-24 MED ORDER — OXYCODONE HCL 10 MG PO TABS: 10.0000 mg | ORAL_TABLET | Freq: Four times a day (QID) | ORAL | 0 refills | Status: DC | PRN

## 2024-09-24 NOTE — Progress Notes (Addendum)
 Subjective:    Patient ID: Rhonda Bradley, female    DOB: 1966-01-26, 58 y.o.   MRN: [id number]  HPI [name] is a 58 year old woman who presents for follow-up of fibromyalgia, chronic fatigue, Hashimoto's.  1) Fibromyalgia which causes chronic pain and fatigue: -currently having left sided neck pain -she also has left sided TMJ pain -trigger point injections did not help -tizanidine helps but only when she has time to rest, she has to care for her daughter's children right now -she takes oxycodone  2-4 times per day -she has been doing exercises in her personal pool -she has been feeling really well -fatigue has been especially severe -she is interested in trying Ritalin  -she feels miserable -she feels that her throat is squeezing -she has been having more pain -she is taking oxycodone  three times per day -she was told she can take up to 4,000mg  extra strength tylenol  per day but she tries not to -when she called to make this appointment she was having an infection -she has had pain for many years, and it makes her miserable -she has had benefit from deep tissue massage. 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  -she loves to swim and this helps her  -heat, sometimes ice -she is prescribed diclofenac and oxycodone  and she tries to take these sparingly.  -Has tried Savella, Lyrica , gabapentin, Cymbalta, Seroquel , amitriptyline  without benefit or had side effect. -average pain is 8/10 -had episode of palpitations, and has felt at times like she is going to fall -she takes furosemide for lower extremity leg swelling.  -she used to be an exercise fanatic years ago and she does not know if this has contributed to a herniated discs. -pain worsens with the cold weather -feeling more pan today with the gloomy weather -last time she was here she mentioned to [facility] about knots in her neck.  -she gets myofascial pain in her neck and upper back, her legs, her shoulder  blades bilaterally.  -worst on her left side.  -husband afraid of hurting her.  -she sometimes gets tightness to wear she can't move her neck.    2) Type 2 diabetes: -was started on insulin and metformin and other medications were stopped  3) Flu -one night she had fluctuating hypo and hypertension -she was taking Lantus and a short acting insulin- she took a short acting my accident -she waited an hour and then took the long acting -she took her opioid and tylenol  and tizanidine -she was aching in a different way  4) Low back pain: -she has been having more spasms -discussed XR results  5) Panic attacks: -went to the hospital a year ago because she thought she was having a heart attack -she had gone to church and that is when it happened  Pain Inventory Average Pain 10 Pain Right Now 10 My pain is burning, stabbing, aching In the last 24 hours, has pain interfered with the following? General activity 7 Relation with others 7 Enjoyment of life 7 What TIME of day is your pain at its worst? Daytime and morning Sleep (in general) Fair  Pain is worse with: walking, bending, sitting, inactivity, standing, and some activites Pain improves with: rest, eat, medication Relief from Meds: 9     Family History  Problem Relation Age of Onset   Breast cancer Maternal Aunt    Allergic rhinitis Mother    Asthma Mother    High blood pressure Mother  High Cholesterol Mother    Depression Mother    Anxiety disorder Mother    Alcoholism Mother    Eczema Father    High blood pressure Father    High Cholesterol Father    Heart disease Father    Depression Father    Liver disease Father    Alcoholism Father    AAA (abdominal aortic aneurysm) Father    Angioedema Maternal Grandfather    Urticaria Maternal Grandfather    Social History   Socioeconomic History   Marital status: Married    Spouse name: Optometrist   Number of children: Not on file   Years of education: Not on file    Highest education level: Not on file  Occupational History   Occupation: Therapist, [employer]  Tobacco Use   Smoking status: Never   Smokeless tobacco: Never  Vaping Use   Vaping status: Never Used  Substance and Sexual Activity   Alcohol use: Yes    Comment: socially    Drug use: No   Sexual activity: Not on file  Other Topics Concern   Not on file  Social History Narrative   Not on file   Social Drivers of Health   Financial Resource Strain: Not on file  Food Insecurity: Low Risk  (08/31/2023)   Received from [facility]   Hunger Vital Sign    Within the past 12 months, you worried that your food would run out before you got money to buy more: Never true    Within the past 12 months, the food you bought just didn't last and you didn't have money to get more. : Never true  Transportation Needs: No Transportation Needs (08/31/2023)   Received from [facility]    In the past 12 months, has lack of reliable transportation kept you from medical appointments, meetings, work or from getting things needed for daily living? : No  Physical Activity: Not on file  Stress: Not on file  Social Connections: Not on file   Past Surgical History:  Procedure Laterality Date   BREAST BIOPSY     BREAST EXCISIONAL BIOPSY     CESAREAN SECTION     x 3   CHOLECYSTECTOMY     THYROIDECTOMY N/A 09/19/2014   Procedure: THYROIDECTOMY;  Surgeon: Krystal Spinner, MD;  Location: [facility];  Service: General;  Laterality: N/A;   Past Medical History:  Diagnosis Date   Anemia    as a child    Anxiety    Asthma    Back pain    Chronic fatigue syndrome    Complication of anesthesia    slow to wake up age 8, but since cholecystectomy and no problems    Constipation    Depression    Diabetes (HCC)    Family history of anesthesia complication    father slow to wake up    Fatty liver    Fibromyalgia    GERD (gastroesophageal reflux disease)     Gestational diabetes    Headache    High blood pressure    High cholesterol    Hypertension    IBS (irritable bowel syndrome)    Joint pain    Lactose intolerance    Osteopenia    SOB (shortness of breath)    BP 110/74 (BP Location: Right Arm, Patient Position: Sitting, Cuff Size: Large)   Pulse 69   Wt 199 lb 12.8 oz (90.6 kg)   SpO2 95%  BMI 31.29 kg/m   Opioid Risk Score:   Fall Risk Score:  `1  Depression screen PHQ 2/9     07/18/2024    2:21 PM 06/19/2024    2:32 PM 05/09/2024    1:08 PM 04/10/2024    2:25 PM 01/08/2022    2:21 PM 01/08/2022    2:20 PM 12/10/2021   11:10 AM  Depression screen PHQ 2/9  Decreased Interest 1 0 1 1 1  0 1  Down, Depressed, Hopeless 1 0 1 1 1  0 1  PHQ - 2 Score 2 0 2 2 2  0 2       Review of Systems  Constitutional:  Negative for unexpected weight change.  HENT: Negative.    Eyes: Negative.   Respiratory:  Positive for wheezing.   Cardiovascular: Negative.   Gastrointestinal:  Negative for constipation, diarrhea and nausea.  Endocrine: Negative.   Genitourinary: Negative.   Musculoskeletal:  Positive for back pain, gait problem and neck pain.       Pain in riht and left leg , pain in both arms,   Skin: Negative.   Allergic/Immunologic: Negative.   Neurological:  Positive for weakness and headaches. Negative for numbness.  Hematological: Negative.   Psychiatric/Behavioral:  Positive for dysphoric mood. The patient is not nervous/anxious.        Anxiety  All other systems reviewed and are negative.      Objective:   Physical Exam Gen: no distress, normal appearing, weight 199 lbs, BMI 30.70, BP 106/71 HEENT: oral mucosa pink and moist, NCAT Cardio: Reg rate Chest: normal effort, normal rate of breathing Abd: soft, non-distended Ext: no edema Psych: pleasant, normal affect Skin: intact Neuro: Alert and oriented x3 Musculoskeletal: Diffuse pain on both sides of her body and in multiple joints. Tight cervical myofascia. Decreased  range of motion and pain, stable 10/20     Assessment & Plan:   1) Fibromyalgia: There is presence of widespread pain above and below the waist on both sides for more than 3 months that is consistent with fibromyalgia. There are related symptoms of fatigue, cognitive dysfunction, unrefreshing sleep, anxiety, and depression. Educated that both genetics and environment can impact fibromyalgia. -reviewed her current medications  -discussed that she is having left sided neck pain, discussed that we tried trigger point injections in the past and these did not help  -discussed that she takes Lunesta  and 1/2 a tizanidine at night  -continue oxycodone   -discussed mechanism of action of low dose naltrexone as an opioid receptor antagonist which stimulates your body's production of its own natural endogenous opioids, helping to decrease pain. Discussed that it can also decrease T cell response and thus be helpful in decreasing inflammation, and symptoms of brain fog, fatigue, anxiety, depression, and allergies. Discussed that this medication needs to be compounded at a compounding pharmacy and can more expensive. Discussed that I usually start at 1mg  and if this is not providing enough relief then I titrate upward on a monthly basis.    -discussed that Journavx  is a highly selective inhibitor for Nav 1.8, which is specific for pain in the peripheral nervous system, discussed that lidocaine in contrast affects all Nav receptors, discussed that patient can try using this medication as prn for pain, though its studies have focused on its use for acute pain. Discussed that it has been studied against opioids for acute pain with comparable efficacy. Discussed that I have not seen any patient side effects thus far. Discussed that we have  samples available and we have copay cards available. Discussed that outpatient if the medication requires a prior auth the copay should be $30 for at least a 60 day supply. The  medication may be more likely to be in stock in [facility] and [facility]. We do have samples available.   -continue exercising in her personal pool -discussed her fatigue, discussed Ritalin , discussed elimination diet rich in fruits and vegetables, discussed that antibody testing for stiffperson syndrom was negative -discussed Lyrica  but she has failed in the past -discussed that she feels miserable due to her pain -decrease oxycodone  to 10mg  q6H prn  -continue heat -discussed that Lyrica  was not helping, and that it caused her to have brain fog and fatigue -discussed that sunlight exposure helps her feel better Discussed that massage could be helpful -discussed tens unit Prescribing Home Zynex NexWave Stimulator Device and supplies as needed. IFC, NMES and TENS medically necessary Treatment Rx: Daily @ 30-40 minutes per treatment PRN. Zynex NexWave only, no substitutions. Treatment Goals: 1) To reduce and/or eliminate pain 2) To improve functional capacity and Activities of daily living 3) To reduce or prevent the need for oral medications 4) To improve circulation in the injured region 5) To decrease or prevent muscle spasm and muscle atrophy 6) To provide a self-management tool to the patient The patient has not sufficiently improved with conservative care. Numerous studies indexed by Medline and PubMed.gov have shown Neuromuscular, Interferential, and TENS stimulators to reduce pain, improve function, and reduce medication use in injured patients. Continued use of this evidence based, safe, drug free treatment is both reasonable and medically necessary at this time.  -discussed the importance of exercise -UDS obtained and contains expected metabolites. Discussed risks and benefits of oxycodone  and prescribed Percocet 5mg  BID PRN. Repeat UDS today.  -discussed side effects of NSAIDs.  -discussed side effects of oxycodone , increase to TID PRN.  -Bacopa recommended.  -recommended taking NAC 600mg  BID.   -continued massage, discussed ts other benefits.  -Discussed current symptoms of pain and history of pain.  -Discussed benefits of exercise in reducing pain. -Discussed following foods that may reduce pain: 1) Ginger (especially studied for arthritis)- reduce leukotriene production to decrease inflammation 2) Blueberries- high in phytonutrients that decrease inflammation 3) Salmon- marine omega-3s reduce joint swelling and pain 4) Pumpkin seeds- reduce inflammation 5) dark chocolate- reduces inflammation 6) turmeric- reduces inflammation 7) tart cherries - reduce pain and stiffness 8) extra virgin olive oil - its compound olecanthal helps to block prostaglandins  9) chili peppers- can be eaten or applied topically via capsaicin 10) mint- helpful for headache, muscle aches, joint pain, and itching 11) garlic- reduces inflammation  Link to further information on diet for chronic pain: [email or web address]   2) Stomach pain- has been diagnosed with IBS.  -advised against using the premier protein.   3) groin pain: -radiating from low back.   4) chronic cough -low dose prednisone helped for some time.   5) Cervical myofascial pain syndrome -referred for therapy for dry needling,  -trigger point injections.   6) Type 2 diabetes: -continue insulin -continue nuts, salmon, fish, chicken -check CBGs daily, log, and bring log to follow-up appointment -avoid sugar, bread, pasta, rice -avoid snacking -perform daily foot exam and at least annual eye exam -try to incorporate into your diet some of the following foods which are good for diabetes: 1) cinnamon- imitates effects of insulin, increasing glucose transport into cells ([place] or [place] cinnamon is best, least processed) 2) nuts- can  slow down the blood sugar response of carbohydrate rich foods 3) oatmeal- contains and anti-inflammatory compound  avenanthramide 4) whole-milk yogurt (best types are no sugar, [place] yogurt, or goat/sheep yogurt) 5) beans- high in protein, fiber, and vitamins, low glycemic index 6) broccoli- great source of vitamin A and C 7) quinoa- higher in protein and fiber than other grains 8) spinach- high in vitamin A, fiber, and protein 9) olive oil- reduces glucose levels, LDL, and triglycerides 10) salmon- excellent amount of omega-3-fatty acids 11) walnuts- rich in antioxidants 12) apples- high in fiber and quercetin 13) carrots- highly nutritious with low impact on blood sugar 14) eggs- improve HDL (good cholesterol), high in protein, keep you satiated 15) turmeric: improves blood sugars, cardiovascular disease, and protects kidney health 16) garlic: improves blood sugar, blood pressure, pain 17) tomatoes: highly nutritious with low impact on blood sugar   8) Migraines: -encouraged avoiding gluten  9) Hungry: -encouraged drinking tea between the meals  10) Flu: -discussed the medications she took the night she had a flu and how she woke up and her body was cold- frozen stiff and she had to work to get her fingers open -discussed that she got off her short acting  11) Low back pain : XR ordered  -Discussed Qutenza as an option for neuropathic pain control. Discussed that this is a capsaicin patch, stronger than capsaicin cream. Discussed that it is currently approved for diabetic peripheral neuropathy and post-herpetic neuralgia, but that it has also shown benefit in treating other forms of neuropathy. Provided patient with link to site to learn more about the patch: https://www.clark.biz/. Discussed that the patch would be placed in office and benefits usually last 3 months. Discussed that unintended exposure to capsaicin can cause severe irritation of eyes, mucous membranes, respiratory tract, and skin, but that Qutenza is a local treatment and does not have the systemic side effects of other nerve  medications. Discussed that there may be pain, itching, erythema, and decreased sensory function associated with the application of Qutenza. Side effects usually subside within 1 week. A cold pack of analgesic medications can help with these side effects. Blood pressure can also be increased due to pain associated with administration of the patch.   12) Thoracic spine pain: XR ordered  13) Cervical spine pain: -discussed that XR shows disc degeneration  14) Neuropathy -Discussed Qutenza as an option for neuropathic pain control. Discussed that this is a capsaicin patch, stronger than capsaicin cream. Discussed that it is currently approved for diabetic peripheral neuropathy and post-herpetic neuralgia, but that it has also shown benefit in treating other forms of neuropathy. Provided patient with link to site to learn more about the patch: https://www.clark.biz/. Discussed that the patch would be placed in office and benefits usually last 3 months. Discussed that unintended exposure to capsaicin can cause severe irritation of eyes, mucous membranes, respiratory tract, and skin, but that Qutenza is a local treatment and does not have the systemic side effects of other nerve medications. Discussed that there may be pain, itching, erythema, and decreased sensory function associated with the application of Qutenza. Side effects usually subside within 1 week. A cold pack of analgesic medications can help with these side effects. Blood pressure can also be increased due to pain associated with administration of the patch.   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 Panic attack: -replace avapro with propanolol daily -recommended Relaxium at night  17. Tremor: -replace avapro with propanolol  18. Insomnia:  -  discussed melatonin  19. Hypotension:  -recommended staggering blood pressure medications and lasix -discussed that she is not taking metoprolol , took this in the past

## 2024-09-24 NOTE — Progress Notes (Deleted)
   Subjective:    Patient ID: Rhonda Bradley, female    DOB: August 13, 1966, 58 y.o.   MRN: [id number]  HPI    Review of Systems     Objective:   Physical Exam        Assessment & Plan:

## 2024-09-24 NOTE — Patient Instructions (Signed)
 Rhonda Bradley

## 2024-09-25 ENCOUNTER — Ambulatory Visit: Admitting: Physical Medicine and Rehabilitation

## 2024-09-25 NOTE — Telephone Encounter (Signed)
Please call to schedule FU, was due last month.

## 2024-09-26 ENCOUNTER — Telehealth: Payer: Self-pay

## 2024-09-26 ENCOUNTER — Telehealth: Admitting: Adult Health

## 2024-09-26 ENCOUNTER — Encounter: Payer: Self-pay | Admitting: Adult Health

## 2024-09-26 DIAGNOSIS — F331 Major depressive disorder, recurrent, moderate: Secondary | ICD-10-CM

## 2024-09-26 DIAGNOSIS — G4700 Insomnia, unspecified: Secondary | ICD-10-CM | POA: Diagnosis not present

## 2024-09-26 DIAGNOSIS — R41840 Attention and concentration deficit: Secondary | ICD-10-CM

## 2024-09-26 DIAGNOSIS — F411 Generalized anxiety disorder: Secondary | ICD-10-CM

## 2024-09-26 MED ORDER — ESZOPICLONE 3 MG PO TABS: ORAL_TABLET | ORAL | 2 refills | Status: DC

## 2024-09-26 NOTE — Telephone Encounter (Signed)
Pt is scheduled for today at 9:30 am

## 2024-09-26 NOTE — Telephone Encounter (Signed)
 Rhonda Bradley (Key: [id number]) Need Help? Call us  at [phone] Outcome Additional Information Required Prior Authorization not required for patient/medication Drug oxyCODONE  HCl 10MG  tablets ePA cloud logo Form [facility] Medicare Electronic Prior Authorization Form 2017 NCPDP Prescriber Instructions    Patient Information  First Middle Last    No results found Confirm Dosage FormAre you requesting an URGENT review?  A request should only be deemed urgent when the prescriber believes that waiting for the standard review timeframe could seriously jeopardize the patient's life, health, or ability to regain maximum function.  Yes  No Prescriber Next Steps  Click the Send to Plan button to submit this information to [facility]. (If it is disabled, be sure all required fields have been completed.)  [facility] will respond automatically with your next steps.  Information regarding your request Prior Authorization not required for patient/medication

## 2024-09-26 NOTE — Progress Notes (Signed)
 Rhonda Bradley [id number] 05-Feb-1966 58 y.o.  Virtual Visit via Video Note  I connected with pt @ on 09/26/24 at  9:30 AM EDT by a video enabled telemedicine application and verified that I am speaking with the correct person using two identifiers.   I discussed the limitations of evaluation and management by telemedicine and the availability of in person appointments. The patient expressed understanding and agreed to proceed.  I discussed the assessment and treatment plan with the patient. The patient was provided an opportunity to ask questions and all were answered. The patient agreed with the plan and demonstrated an understanding of the instructions.   The patient was advised to call back or seek an in-person evaluation if the symptoms worsen or if the condition fails to improve as anticipated.  I provided 25 minutes of non-face-to-face time during this encounter.  The patient was located at home.  The provider was located at [facility].   Angeline LOISE Sayers, NP   Subjective:   Patient ID:  Rhonda Bradley is a 58 y.o. (DOB October 11, 1966) female.  Chief Complaint: No chief complaint on file.   HPI VIANNE GRIESHOP presents for follow-up of cognitive attention deficit, GAD, MDD, and insomnia.  Describes mood today as ok. Pleasant. Tearful at times. Mood symptoms - reports some depression - I'm not the person I used to be. Reports lower interest and motivation - I feel tired. Reports anxiety and irritability. Denies recent panic attacks, but reports anxiety attacks. Reports some worry, rumination and over thinking. Reports mood is stable. Stating I feel like I'm doing ok - hurting a lot. Reports worsening chronic pain and fatigue from Fibromyalgia (diagnosed in 2000) - taking pain medication. Feels like medications are helpful. Taking medications as prescribed.  Energy levels lower. Active, does not have a regular exercise with physical limitations Enjoys  some usual interests and activities. Married. Lives with husband. Has 3 children - 2 boys and 1 girl - 8 grandchildren. Family local.   Appetite adequate. Weight stable - 195 pounds. Sleeps better some nights than others. Averages 6 to 8 hours. Reports napping during the day. Reports focus and concentration difficulties. Reports ongoing cognitive issues related to fibromyalgia brain fog. Completing tasks - a little bit as I can. Managing minimal aspects of household - limited by pain. Unable to work - receiving disability benefits since 10/2022. Denies SI or HI.  Denies AH or VH. Denies self harm. Denies substance.  Previous medication trials: Wellbutrin  SR, Wellbutrin  XL, Cymbalta, Gabapentin   Review of Systems:  Review of Systems  Musculoskeletal:  Negative for gait problem.  Neurological:  Negative for tremors.  Psychiatric/Behavioral:         Please refer to HPI    Medications: I have reviewed the patient's current medications.  Current Outpatient Medications  Medication Sig Dispense Refill   albuterol  (PROVENTIL  HFA;VENTOLIN  HFA) 108 (90 Base) MCG/ACT inhaler Inhale 2 puffs into the lungs every 4 (four) hours as needed for wheezing or shortness of breath. 1 Inhaler 2   Azelastine -Fluticasone  137-50 MCG/ACT SUSP place 2 sprays into both nostrils twice a day nasal 30 days     budesonide -formoterol  (SYMBICORT ) 160-4.5 MCG/ACT inhaler INHALE TWO PUFFS TWICE DAILY TO PREVENT COUGH OR WHEEZE. RINSE MOUTH AFTER USE. 10.2 Inhaler 0   cholecalciferol (VITAMIN D3) 25 MCG (1000 UNIT) tablet Take by mouth.     clindamycin (CLEOCIN) 2 % vaginal cream Place 1 Applicatorful vaginally at bedtime.     dicyclomine (BENTYL) 20  MG tablet 1 tablet     escitalopram  (LEXAPRO ) 20 MG tablet Take 1 tablet (20 mg total) by mouth daily. 90 tablet 3   esomeprazole  (NEXIUM ) 40 MG capsule Take 40 mg by mouth daily.     estradiol (ESTRACE) 0.1 MG/GM vaginal cream 1 GRAM VAGINAL TWICE WEEKLY 90 DAYS      Eszopiclone  3 MG TABS TAKE 1 TABLET BY MOUTH NIGHTLY IMMEDIATELY BEFORE BEDTIME 30 tablet 2   fluticasone  (VERAMYST) 27.5 MCG/SPRAY nasal spray Place 2 sprays into the nose daily.     furosemide (LASIX) 40 MG tablet Take 80 mg by mouth daily. Patient takes as needed per patient     furosemide (LASIX) 80 MG tablet TAKE 1 TABLET ALTERNATE WITH 1/2 TABLET EVERY OTHER DAY AS NEEDED     ibuprofen (ADVIL) 200 MG tablet 4 tablets with food or milk as needed     irbesartan (AVAPRO) 75 MG tablet Take 37.5 mg by mouth daily.     levothyroxine (SYNTHROID ) 75 MCG tablet Take 75 mcg by mouth every morning.     liothyronine (CYTOMEL) 5 MCG tablet TAKE 1 TABLET BY MOUTH TWICE A DAY ON AN EMPTY STOMACH  5   methylphenidate  (RITALIN ) 5 MG tablet Take 1 tablet (5 mg total) by mouth daily. 90 tablet 0   montelukast  (SINGULAIR ) 10 MG tablet Take 1 tablet (10 mg total) by mouth at bedtime. 30 tablet 5   Oxycodone  HCl 10 MG TABS Take 1 tablet (10 mg total) by mouth every 6 (six) hours as needed. 120 tablet 0   pregabalin  (LYRICA ) 25 MG capsule Take 1 capsule (25 mg total) by mouth daily as needed. 90 capsule 3   propranolol  (INDERAL ) 10 MG tablet Take 1 tablet (10 mg total) by mouth daily. 90 tablet 3   rosuvastatin  (CRESTOR ) 20 MG tablet Take 1 tablet (20 mg total) by mouth daily. 90 tablet 3   RYBELSUS 14 MG TABS Take by mouth every morning.     Suzetrigine  (JOURNAVX ) 50 MG TABS Take 1 tablet by mouth daily as needed. 30 tablet 0   tiZANidine (ZANAFLEX) 4 MG tablet Take 4 mg by mouth in the morning and at bedtime.     Vitamin D , Ergocalciferol , (DRISDOL ) 1.25 MG (50000 UNIT) CAPS capsule Take 1 capsule (50,000 Units total) by mouth every 7 (seven) days. 7 capsule 0   No current facility-administered medications for this visit.    Medication Side Effects: None  Allergies:  Allergies  Allergen Reactions   5ht3 Receptor Antagonists Other (See Comments)    Other reaction(s): Unknown Other reaction(s): Unknown    Atorvastatin Other (See Comments)    Other reaction(s): muscle pain  Other reaction(s): muscle pain Other reaction(s): muscle pain   Erythromycin Nausea And Vomiting and Other (See Comments)    Other reaction(s): Upset GI  Other reaction(s): Upset GI Other reaction(s): Upset GI   Prednisone Other (See Comments)   Metronidazole Rash and Other (See Comments)    Other reaction(s): rash   Other Rash   Penicillins Rash and Dermatitis    Other reaction(s): rash  Other reaction(s): rash Other reaction(s): rash   Sulfamethoxazole-Trimethoprim Other (See Comments), Rash and Dermatitis    Other reaction(s): rash  Other reaction(s): rash    Other reaction(s): rash Other reaction(s): rash  Other reaction(s): rash    Past Medical History:  Diagnosis Date   Anemia    as a child    Anxiety    Asthma    Back pain  Chronic fatigue syndrome    Complication of anesthesia    slow to wake up age 50, but since cholecystectomy and no problems    Constipation    Depression    Diabetes (HCC)    Family history of anesthesia complication    father slow to wake up    Fatty liver    Fibromyalgia    GERD (gastroesophageal reflux disease)    Gestational diabetes    Headache    High blood pressure    High cholesterol    Hypertension    IBS (irritable bowel syndrome)    Joint pain    Lactose intolerance    Osteopenia    SOB (shortness of breath)     Family History  Problem Relation Age of Onset   Breast cancer Maternal Aunt    Allergic rhinitis Mother    Asthma Mother    High blood pressure Mother    High Cholesterol Mother    Depression Mother    Anxiety disorder Mother    Alcoholism Mother    Eczema Father    High blood pressure Father    High Cholesterol Father    Heart disease Father    Depression Father    Liver disease Father    Alcoholism Father    AAA (abdominal aortic aneurysm) Father    Angioedema Maternal Grandfather    Urticaria Maternal Grandfather      Social History   Socioeconomic History   Marital status: Married    Spouse name: Optometrist   Number of children: Not on file   Years of education: Not on file   Highest education level: Not on file  Occupational History   Occupation: Therapist, [employer]  Tobacco Use   Smoking status: Never   Smokeless tobacco: Never  Vaping Use   Vaping status: Never Used  Substance and Sexual Activity   Alcohol use: Yes    Comment: socially    Drug use: No   Sexual activity: Not on file  Other Topics Concern   Not on file  Social History Narrative   Not on file   Social Drivers of Health   Financial Resource Strain: Not on file  Food Insecurity: Low Risk  (08/31/2023)   Received from [facility]   Hunger Vital Sign    Within the past 12 months, you worried that your food would run out before you got money to buy more: Never true    Within the past 12 months, the food you bought just didn't last and you didn't have money to get more. : Never true  Transportation Needs: No Transportation Needs (08/31/2023)   Received from [facility]    In the past 12 months, has lack of reliable transportation kept you from medical appointments, meetings, work or from getting things needed for daily living? : No  Physical Activity: Not on file  Stress: Not on file  Social Connections: Not on file  Intimate Partner Violence: Not on file    Past Medical History, Surgical history, Social history, and Family history were reviewed and updated as appropriate.   Please see review of systems for further details on the patient's review from today.   Objective:   Physical Exam:  There were no vitals taken for this visit.  Physical Exam Constitutional:      General: She is not in acute distress. Musculoskeletal:        General: No deformity.  Neurological:  Mental Status: She is alert and oriented to person, place, and time.     Coordination:  Coordination normal.  Psychiatric:        Attention and Perception: Attention and perception normal. She does not perceive auditory or visual hallucinations.        Mood and Affect: Mood normal. Mood is not anxious or depressed. Affect is not labile, blunt, angry or inappropriate.        Speech: Speech normal.        Behavior: Behavior normal.        Thought Content: Thought content normal. Thought content is not paranoid or delusional. Thought content does not include homicidal or suicidal ideation. Thought content does not include homicidal or suicidal plan.        Cognition and Memory: Cognition and memory normal.        Judgment: Judgment normal.     Comments: Insight intact     Lab Review:     Component Value Date/Time   NA 135 03/27/2023 1458   NA 142 07/05/2019 1048   K 3.6 03/27/2023 1458   CL 104 03/27/2023 1458   CO2 20 (L) 03/27/2023 1458   GLUCOSE 222 (H) 03/27/2023 1458   BUN 14 03/27/2023 1458   BUN 12 07/05/2019 1048   CREATININE 0.76 03/27/2023 1458   CALCIUM  9.1 03/27/2023 1458   PROT 7.2 07/05/2019 1048   ALBUMIN 5.1 (H) 07/05/2019 1048   AST 26 07/05/2019 1048   ALT 56 (H) 07/05/2019 1048   ALKPHOS 59 07/05/2019 1048   BILITOT 0.5 07/05/2019 1048   GFRNONAA >60 03/27/2023 1458   GFRAA 116 07/05/2019 1048       Component Value Date/Time   WBC 9.9 03/27/2023 1458   RBC 5.14 (H) 03/27/2023 1458   HGB 14.0 03/27/2023 1458   HGB 14.7 01/30/2019 1808   HCT 43.5 03/27/2023 1458   HCT 43.2 01/30/2019 1808   PLT 363 03/27/2023 1458   PLT 363 01/30/2019 1808   MCV 84.6 03/27/2023 1458   MCV 84 01/30/2019 1808   MCH 27.2 03/27/2023 1458   MCHC 32.2 03/27/2023 1458   RDW 13.8 03/27/2023 1458   RDW 13.3 01/30/2019 1808   LYMPHSABS 3.5 (H) 01/30/2019 1808   MONOABS 0.5 09/05/2010 2335   EOSABS 0.1 01/30/2019 1808   BASOSABS 0.1 01/30/2019 1808    No results found for: POCLITH, LITHIUM   No results found for: PHENYTOIN, PHENOBARB, VALPROATE,  CBMZ   .res Assessment: Plan:    Plan:  PDMP reviewed  1. Lunesta  3mg  at bedtime for sleep 2. Lexapro  20mg  daily  Discussed adding low dose of Xanax 0.25mg  as needed - will call when needed.  RTC 6 months  25 minutes spent dedicated to the care of this patient on the date of this encounter to include pre-visit review of records, ordering of medication, post visit documentation, and face-to-face time with the patient discussing cognitive attention deficit, GAD, MDD, and insomnia. Discussed continuing current medication regimen.  Patient advised to contact office with any questions, adverse effects, or acute worsening in signs and symptoms.  Discussed potential metabolic side effects associated with atypical antipsychotics, as well as potential risk for movement side effects. Advised pt to contact office if movement side effects occur.   Discussed potential benefits, risks, and side effects of stimulants with patient to include increased heart rate, palpitations, insomnia, increased anxiety, increased irritability, or decreased appetite.  Instructed patient to contact office if experiencing any significant tolerability issues.  Diagnoses and all orders for this visit:  Insomnia, unspecified type     Please see After Visit Summary for patient specific instructions.  Future Appointments  Date Time Provider Department Center  09/26/2024  9:30 AM Erskine Steinfeldt Nattalie, NP [facility] None  01/16/2025  2:15 PM Gregg Lek, MD [facility] None    No orders of the defined types were placed in this encounter.     -------------------------------

## 2024-10-19 ENCOUNTER — Encounter: Payer: Self-pay | Admitting: Registered Nurse

## 2024-10-19 ENCOUNTER — Encounter: Attending: Physical Medicine and Rehabilitation | Admitting: Registered Nurse

## 2024-10-19 VITALS — BP 117/78 | HR 73 | Ht 67.0 in | Wt 198.0 lb

## 2024-10-19 DIAGNOSIS — M546 Pain in thoracic spine: Secondary | ICD-10-CM | POA: Insufficient documentation

## 2024-10-19 DIAGNOSIS — G894 Chronic pain syndrome: Secondary | ICD-10-CM | POA: Diagnosis not present

## 2024-10-19 DIAGNOSIS — M5412 Radiculopathy, cervical region: Secondary | ICD-10-CM | POA: Diagnosis not present

## 2024-10-19 DIAGNOSIS — M542 Cervicalgia: Secondary | ICD-10-CM | POA: Diagnosis not present

## 2024-10-19 DIAGNOSIS — Z5181 Encounter for therapeutic drug level monitoring: Secondary | ICD-10-CM | POA: Insufficient documentation

## 2024-10-19 DIAGNOSIS — M797 Fibromyalgia: Secondary | ICD-10-CM | POA: Diagnosis not present

## 2024-10-19 DIAGNOSIS — Z79899 Other long term (current) drug therapy: Secondary | ICD-10-CM | POA: Insufficient documentation

## 2024-10-19 DIAGNOSIS — M545 Low back pain, unspecified: Secondary | ICD-10-CM | POA: Diagnosis not present

## 2024-10-19 NOTE — Patient Instructions (Signed)
 My- Chart:   [phone]

## 2024-10-19 NOTE — Progress Notes (Signed)
 Subjective:    Patient ID: Rhonda Bradley, female    DOB: Nov 29, 1966, 58 y.o.   MRN: [id number]  HPI: Rhonda Bradley is a 58 y.o. female who returns for follow up appointment for chronic pain and medication refill. She states her pain is located in her neck radiating into her left shoulder, upper- lower back , also reports she has generalized fibro pain. Rhonda Bradley reports she has a headache today, onset on her drive to the office. She rates her pain 7. Her current exercise regime is walking and performing stretching exercises.  Rhonda Bradley  Morphine equivalent is 60.00 MME.   Last UDS was Performed on 07/18/2024, it was consistent.      Pain Inventory Average Pain 10 Pain Right Now 7 My pain is constant, sharp, burning, dull, stabbing, and aching  In the last 24 hours, has pain interfered with the following? General activity 5 Relation with others 5 Enjoyment of life 5 What TIME of day is your pain at its worst? morning , daytime, and night Sleep (in general) Good  Pain is worse with: walking, bending, sitting, inactivity, standing, and some activites Pain improves with: rest, heat/ice, and medication Relief from Meds: 7  Family History  Problem Relation Age of Onset   Breast cancer Maternal Aunt    Allergic rhinitis Mother    Asthma Mother    High blood pressure Mother    High Cholesterol Mother    Depression Mother    Anxiety disorder Mother    Alcoholism Mother    Eczema Father    High blood pressure Father    High Cholesterol Father    Heart disease Father    Depression Father    Liver disease Father    Alcoholism Father    AAA (abdominal aortic aneurysm) Father    Angioedema Maternal Grandfather    Urticaria Maternal Grandfather    Social History   Socioeconomic History   Marital status: Married    Spouse name: Optometrist   Number of children: Not on file   Years of education: Not on file   Highest education level: Not on file  Occupational History    Occupation: Therapist, [employer]  Tobacco Use   Smoking status: Never   Smokeless tobacco: Never  Vaping Use   Vaping status: Never Used  Substance and Sexual Activity   Alcohol use: Yes    Comment: socially    Drug use: No   Sexual activity: Not on file  Other Topics Concern   Not on file  Social History Narrative   Not on file   Social Drivers of Health   Financial Resource Strain: Not on file  Food Insecurity: Low Risk  (08/31/2023)   Received from [facility]   Hunger Vital Sign    Within the past 12 months, you worried that your food would run out before you got money to buy more: Never true    Within the past 12 months, the food you bought just didn't last and you didn't have money to get more. : Never true  Transportation Needs: No Transportation Needs (08/31/2023)   Received from [facility]    In the past 12 months, has lack of reliable transportation kept you from medical appointments, meetings, work or from getting things needed for daily living? : No  Physical Activity: Not on file  Stress: Not on file  Social Connections: Not on file   Past Surgical History:  Procedure Laterality Date   BREAST BIOPSY     BREAST EXCISIONAL BIOPSY     CESAREAN SECTION     x 3   CHOLECYSTECTOMY     THYROIDECTOMY N/A 09/19/2014   Procedure: THYROIDECTOMY;  Surgeon: Krystal Spinner, MD;  Location: [facility];  Service: General;  Laterality: N/A;   Past Surgical History:  Procedure Laterality Date   BREAST BIOPSY     BREAST EXCISIONAL BIOPSY     CESAREAN SECTION     x 3   CHOLECYSTECTOMY     THYROIDECTOMY N/A 09/19/2014   Procedure: THYROIDECTOMY;  Surgeon: Krystal Spinner, MD;  Location: [facility];  Service: General;  Laterality: N/A;   Past Medical History:  Diagnosis Date   Anemia    as a child    Anxiety    Asthma    Back pain    Chronic fatigue syndrome    Complication of anesthesia    slow to wake up age 57, but since  cholecystectomy and no problems    Constipation    Depression    Diabetes (HCC)    Family history of anesthesia complication    father slow to wake up    Fatty liver    Fibromyalgia    GERD (gastroesophageal reflux disease)    Gestational diabetes    Headache    High blood pressure    High cholesterol    Hypertension    IBS (irritable bowel syndrome)    Joint pain    Lactose intolerance    Osteopenia    SOB (shortness of breath)    There were no vitals taken for this visit.  Opioid Risk Score:   Fall Risk Score:  `1  Depression screen PHQ 2/9     07/18/2024    2:21 PM 06/19/2024    2:32 PM 05/09/2024    1:08 PM 04/10/2024    2:25 PM 01/08/2022    2:21 PM 01/08/2022    2:20 PM 12/10/2021   11:10 AM  Depression screen PHQ 2/9  Decreased Interest 1 0 1 1 1  0 1  Down, Depressed, Hopeless 1 0 1 1 1  0 1  PHQ - 2 Score 2 0 2 2 2  0 2    Review of Systems  Musculoskeletal:  Positive for arthralgias, back pain and neck pain.       Pain all over the body  Neurological:  Positive for headaches.  All other systems reviewed and are negative.      Objective:   Physical Exam Vitals and nursing note reviewed.  Constitutional:      Appearance: Normal appearance.  Neck:     Comments: Cervical Paraspinal Tenderness: C-5-C-6 Cardiovascular:     Rate and Rhythm: Normal rate and regular rhythm.     Pulses: Normal pulses.     Heart sounds: Normal heart sounds.  Pulmonary:     Effort: Pulmonary effort is normal.     Breath sounds: Normal breath sounds.  Musculoskeletal:     Comments: Normal Muscle Bulk and Muscle Testing Reveals:  Upper Extremities: Full ROM and Muscle Strength 5/5 Bilateral AC Joint Tenderness  Thoracic and Lumbar Hypersensitivity Lower Extremities: Full ROM and Muscle Strength 5/5 Left Lower extremity Flexion Produces Pain into her Left Patella Arises from Table slowly Narrow Based  Gait     Skin:    General: Skin is warm and dry.  Neurological:     Mental  Status: She is alert and oriented to person, place, and time.  Psychiatric:        Mood and Affect: Mood normal.        Behavior: Behavior normal.          Assessment & Plan:  Cervicalgia/ Cervical Radiculitis: Continue HEP as tolerated. Continue to monitor. 10/19/2024 2.Chronic Bilateral Mid-Low-Back Pain without Sciatica: Continue HEP as Tolerated. Continue Current medication regimen. Continue to monitor. 10/19/2024 3.Fibromyalgia: Continue HEP as Tolerated. Continue Pregabalin , Continue to monitor. 10/19/2024 4. Chronic Pain Syndrome: Continue Oxycodone  10mg  one tablet every 6 hours as needed for pain # 120. We will continue the opioid monitoring program, this consists of regular clinic visits, examinations, urine drug screen, pill counts as well as use of [place]  Controlled Substance Reporting system. A 12 month History has been reviewed on the [place]  Controlled Substance Reporting System on 10/19/2024     F/U in 1 month

## 2024-10-25 ENCOUNTER — Telehealth: Payer: Self-pay | Admitting: Adult Health

## 2024-10-25 NOTE — Telephone Encounter (Signed)
 Patient called in for refill for Lexapro  20mg  and Eszopiclone  3mg . PH: [phone] Appt 4/20 Pharmacy [facility] [address] [place], [place]

## 2024-10-25 NOTE — Telephone Encounter (Signed)
 Called patient because she has RF of both medications listed. She was asking for Xanax. Per last note [facility] said would send a low dose when requested. Has not been prescribed by [facility]. Pended RF.

## 2024-10-25 NOTE — Telephone Encounter (Signed)
 Las note mentions:  Discussed adding low dose of Xanax  0.25mg  as needed - will call when needed.   She is asking for Rx of Xanax  now. Please let me know qty/frequency and I will pend.

## 2024-10-26 ENCOUNTER — Other Ambulatory Visit: Payer: Self-pay

## 2024-10-26 DIAGNOSIS — F411 Generalized anxiety disorder: Secondary | ICD-10-CM

## 2024-10-26 MED ORDER — ALPRAZOLAM 0.25 MG PO TABS
0.2500 mg | ORAL_TABLET | Freq: Every day | ORAL | 2 refills | Status: AC | PRN
Start: 2024-10-26 — End: ?

## 2024-10-26 NOTE — Telephone Encounter (Signed)
 Pended

## 2024-11-13 ENCOUNTER — Encounter: Attending: Physical Medicine and Rehabilitation | Admitting: Physical Medicine and Rehabilitation

## 2024-11-13 DIAGNOSIS — M797 Fibromyalgia: Secondary | ICD-10-CM | POA: Insufficient documentation

## 2024-11-13 DIAGNOSIS — G894 Chronic pain syndrome: Secondary | ICD-10-CM | POA: Insufficient documentation

## 2024-11-13 MED ORDER — BUPRENORPHINE 5 MCG/HR TD PTWK: 1.0000 | MEDICATED_PATCH | TRANSDERMAL | 0 refills | Status: DC

## 2024-11-13 MED ORDER — OXYCODONE HCL 10 MG PO TABS: 10.0000 mg | ORAL_TABLET | Freq: Four times a day (QID) | ORAL | 0 refills | Status: DC | PRN

## 2024-11-13 NOTE — Progress Notes (Signed)
 Subjective:    Patient ID: Rhonda Bradley, female    DOB: 1966/10/13, 58 y.o.   MRN: [id number]  HPI An audio/video tele-health visit is felt to be the most appropriate encounter for this patient at this time. This is a follow up tele-visit via phone. The patient is at home. MD is at office. Prior to scheduling this appointment, our staff discussed the limitations of evaluation and management by telemedicine and the availability of in-person appointments. The patient expressed understanding and agreed to proceed.  Rhonda Bradley is a 58 year old woman who presents for follow-up of fibromyalgia, chronic fatigue, Hashimoto's.  1) Fibromyalgia which causes chronic pain and fatigue: -she asks whether oxycodone  can be increased to q4H -she does not want to increase the lyrica  as she already has cognitive issues -she asks whether the butrans  patch is expensive -she has tried over the counter patches from [employer] but she ended up giving those to her sister as they were not helpful -currently having left sided neck pain -she also has left sided TMJ pain -trigger point injections did not help -tizanidine helps but only when she has time to rest, she has to care for her daughter's children right now -she takes oxycodone  2-4 times per day -she has been doing exercises in her personal pool -she has been feeling really well -fatigue has been especially severe -she is interested in trying Ritalin  -she feels miserable -she feels that her throat is squeezing -she has been having more pain -she is taking oxycodone  three times per day -she was told she can take up to 4,000mg  extra strength tylenol  per day but she tries not to -when she called to make this appointment she was having an infection -she has had pain for many years, and it makes her miserable -she has had benefit from deep tissue massage. She loves [employer] and she works in her home.  -she loves to swim and this helps her  -heat,  sometimes ice -she is prescribed diclofenac and oxycodone  and she tries to take these sparingly.  -Has tried Savella, Lyrica , gabapentin, Cymbalta, Seroquel , amitriptyline  without benefit or had side effect. -average pain is 8/10 -had episode of palpitations, and has felt at times like she is going to fall -she takes furosemide for lower extremity leg swelling.  -she used to be an exercise fanatic years ago and she does not know if this has contributed to a herniated discs. -pain worsens with the cold weather -feeling more pan today with the gloomy weather -last time she was here she mentioned to [facility] about knots in her neck.  -she gets myofascial pain in her neck and upper back, her legs, her shoulder blades bilaterally.  -worst on her left side.  -husband afraid of hurting her.  -she sometimes gets tightness to wear she can't move her neck.    2) Type 2 diabetes: -was started on insulin and metformin and other medications were stopped  3) Flu -one night she had fluctuating hypo and hypertension -she was taking Lantus and a short acting insulin- she took a short acting my accident -she waited an hour and then took the long acting -she took her opioid and tylenol  and tizanidine -she was aching in a different way  4) Low back pain: -she has been having more spasms -discussed XR results  5) Panic attacks: -went to the hospital a year ago because she thought she was having a heart attack -she had gone to church and that is  when it happened  Pain Inventory Average Pain 10 Pain Right Now 10 My pain is burning, stabbing, aching In the last 24 hours, has pain interfered with the following? General activity 7 Relation with others 7 Enjoyment of life 7 What TIME of day is your pain at its worst? Daytime and morning Sleep (in general) Fair  Pain is worse with: walking, bending, sitting, inactivity, standing, and some activites Pain improves with: rest, eat, medication Relief  from Meds: 9     Family History  Problem Relation Age of Onset   Breast cancer Maternal Aunt    Allergic rhinitis Mother    Asthma Mother    High blood pressure Mother    High Cholesterol Mother    Depression Mother    Anxiety disorder Mother    Alcoholism Mother    Eczema Father    High blood pressure Father    High Cholesterol Father    Heart disease Father    Depression Father    Liver disease Father    Alcoholism Father    AAA (abdominal aortic aneurysm) Father    Angioedema Maternal Grandfather    Urticaria Maternal Grandfather    Social History   Socioeconomic History   Marital status: Married    Spouse name: Optometrist   Number of children: Not on file   Years of education: Not on file   Highest education level: Not on file  Occupational History   Occupation: Therapist, [employer]  Tobacco Use   Smoking status: Never   Smokeless tobacco: Never  Vaping Use   Vaping status: Never Used  Substance and Sexual Activity   Alcohol use: Yes    Comment: socially    Drug use: No   Sexual activity: Not on file  Other Topics Concern   Not on file  Social History Narrative   Not on file   Social Drivers of Health   Financial Resource Strain: Not on file  Food Insecurity: Low Risk (08/31/2023)   Received from [facility]   Hunger Vital Sign    Within the past 12 months, you worried that your food would run out before you got money to buy more: Never true    Within the past 12 months, the food you bought just didn't last and you didn't have money to get more. : Never true  Transportation Needs: No Transportation Needs (08/31/2023)   Received from [facility]    In the past 12 months, has lack of reliable transportation kept you from medical appointments, meetings, work or from getting things needed for daily living? : No  Physical Activity: Not on file  Stress: Not on file  Social Connections: Not on file   Past Surgical  History:  Procedure Laterality Date   BREAST BIOPSY     BREAST EXCISIONAL BIOPSY     CESAREAN SECTION     x 3   CHOLECYSTECTOMY     THYROIDECTOMY N/A 09/19/2014   Procedure: THYROIDECTOMY;  Surgeon: Krystal Spinner, MD;  Location: [facility];  Service: General;  Laterality: N/A;   Past Medical History:  Diagnosis Date   Anemia    as a child    Anxiety    Asthma    Back pain    Chronic fatigue syndrome    Complication of anesthesia    slow to wake up age 2, but since cholecystectomy and no problems    Constipation    Depression    Diabetes (  HCC)    Family history of anesthesia complication    father slow to wake up    Fatty liver    Fibromyalgia    GERD (gastroesophageal reflux disease)    Gestational diabetes    Headache    High blood pressure    High cholesterol    Hypertension    IBS (irritable bowel syndrome)    Joint pain    Lactose intolerance    Osteopenia    SOB (shortness of breath)    There were no vitals taken for this visit.  Opioid Risk Score:   Fall Risk Score:  `1  Depression screen PHQ 2/9     10/19/2024    2:01 PM 07/18/2024    2:21 PM 06/19/2024    2:32 PM 05/09/2024    1:08 PM 04/10/2024    2:25 PM 01/08/2022    2:21 PM 01/08/2022    2:20 PM  Depression screen PHQ 2/9  Decreased Interest 0 1 0 1 1 1  0  Down, Depressed, Hopeless 0 1 0 1 1 1  0  PHQ - 2 Score 0 2 0 2 2 2  0       Review of Systems  Constitutional:  Negative for unexpected weight change.  HENT: Negative.    Eyes: Negative.   Respiratory:  Positive for wheezing.   Cardiovascular: Negative.   Gastrointestinal:  Negative for constipation, diarrhea and nausea.  Endocrine: Negative.   Genitourinary: Negative.   Musculoskeletal:  Positive for back pain, gait problem and neck pain.       Pain in riht and left leg , pain in both arms,   Skin: Negative.   Allergic/Immunologic: Negative.   Neurological:  Positive for weakness and headaches. Negative for numbness.  Hematological:  Negative.   Psychiatric/Behavioral:  Positive for dysphoric mood. The patient is not nervous/anxious.        Anxiety  All other systems reviewed and are negative.      Objective:   Physical Exam PRIOR EXAM Gen: no distress, normal appearing, weight 199 lbs, BMI 30.70, BP 106/71 HEENT: oral mucosa pink and moist, NCAT Cardio: Reg rate Chest: normal effort, normal rate of breathing Abd: soft, non-distended Ext: no edema Psych: pleasant, normal affect Skin: intact Neuro: Alert and oriented x3 Musculoskeletal: Diffuse pain on both sides of her body and in multiple joints. Tight cervical myofascia. Decreased range of motion and pain     Assessment & Plan:   1) Fibromyalgia: There is presence of widespread pain above and below the waist on both sides for more than 3 months that is consistent with fibromyalgia. There are related symptoms of fatigue, cognitive dysfunction, unrefreshing sleep, anxiety, and depression. Educated that both genetics and environment can impact fibromyalgia. -reviewed her current medications  -discussed that she is having left sided neck pain, discussed that we tried trigger point injections in the past and these did not help  -discussed that she takes Lunesta  and 1/2 a tizanidine at night  -continue oxycodone , discussed that we are no longer permitted to prescribe opioids at q4H frequency  -discussed Butans patch and ordered  -discussed mechanism of action of low dose naltrexone as an opioid receptor antagonist which stimulates your body's production of its own natural endogenous opioids, helping to decrease pain. Discussed that it can also decrease T cell response and thus be helpful in decreasing inflammation, and symptoms of brain fog, fatigue, anxiety, depression, and allergies. Discussed that this medication needs to be compounded at a compounding pharmacy and  can more expensive. Discussed that I usually start at 1mg  and if this is not providing enough  relief then I titrate upward on a monthly basis.    -discussed that Journavx  is a highly selective inhibitor for Nav 1.8, which is specific for pain in the peripheral nervous system, discussed that lidocaine in contrast affects all Nav receptors, discussed that patient can try using this medication as prn for pain, though its studies have focused on its use for acute pain. Discussed that it has been studied against opioids for acute pain with comparable efficacy. Discussed that I have not seen any patient side effects thus far. Discussed that we have samples available and we have copay cards available. Discussed that outpatient if the medication requires a prior auth the copay should be $30 for at least a 60 day supply. The medication may be more likely to be in stock in [facility] and [facility]. We do have samples available.   -continue exercising in her personal pool -discussed her fatigue, discussed Ritalin , discussed elimination diet rich in fruits and vegetables, discussed that antibody testing for stiffperson syndrom was negative -discussed Lyrica  but she has failed in the past -discussed that she feels miserable due to her pain  -continue heat -discussed that cold makes the pain worse -discussed that Lyrica  was not helping, and that it caused her to have brain fog and fatigue -discussed that sunlight exposure helps her feel better Discussed that massage could be helpful -discussed tens unit Prescribing Home Zynex NexWave Stimulator Device and supplies as needed. IFC, NMES and TENS medically necessary Treatment Rx: Daily @ 30-40 minutes per treatment PRN. Zynex NexWave only, no substitutions. Treatment Goals: 1) To reduce and/or eliminate pain 2) To improve functional capacity and Activities of daily living 3) To reduce or prevent the need for oral medications 4) To improve circulation in the injured region 5) To decrease or prevent muscle spasm and muscle atrophy 6) To provide a self-management tool  to the patient The patient has not sufficiently improved with conservative care. Numerous studies indexed by Medline and PubMed.gov have shown Neuromuscular, Interferential, and TENS stimulators to reduce pain, improve function, and reduce medication use in injured patients. Continued use of this evidence based, safe, drug free treatment is both reasonable and medically necessary at this time.  -discussed the importance of exercise -UDS obtained and contains expected metabolites. Discussed risks and benefits of oxycodone  and prescribed Percocet 5mg  BID PRN. Repeat UDS today.  -discussed side effects of NSAIDs.  -discussed side effects of oxycodone , increase to TID PRN.  -Bacopa recommended.  -recommended taking NAC 600mg  BID.  -continued massage, discussed ts other benefits.  -Discussed current symptoms of pain and history of pain.  -Discussed benefits of exercise in reducing pain. -Discussed following foods that may reduce pain: 1) Ginger (especially studied for arthritis)- reduce leukotriene production to decrease inflammation 2) Blueberries- high in phytonutrients that decrease inflammation 3) Salmon- marine omega-3s reduce joint swelling and pain 4) Pumpkin seeds- reduce inflammation 5) dark chocolate- reduces inflammation 6) turmeric- reduces inflammation 7) tart cherries - reduce pain and stiffness 8) extra virgin olive oil - its compound olecanthal helps to block prostaglandins  9) chili peppers- can be eaten or applied topically via capsaicin 10) mint- helpful for headache, muscle aches, joint pain, and itching 11) garlic- reduces inflammation  Link to further information on diet for chronic pain: http://www.bray.com/   2) Stomach pain- has been diagnosed with IBS.  -advised against using the premier protein.   3)  groin pain: -radiating from low back.   4) chronic cough -low dose prednisone helped for some  time.   5) Cervical myofascial pain syndrome -referred for therapy for dry needling,  -trigger point injections.   6) Type 2 diabetes: -continue insulin -continue nuts, salmon, fish, chicken -check CBGs daily, log, and bring log to follow-up appointment -avoid sugar, bread, pasta, rice -avoid snacking -perform daily foot exam and at least annual eye exam -try to incorporate into your diet some of the following foods which are good for diabetes: 1) cinnamon- imitates effects of insulin, increasing glucose transport into cells ([place] or Vietnamese cinnamon is best, least processed) 2) nuts- can slow down the blood sugar response of carbohydrate rich foods 3) oatmeal- contains and anti-inflammatory compound avenanthramide 4) whole-milk yogurt (best types are no sugar, Greek yogurt, or goat/sheep yogurt) 5) beans- high in protein, fiber, and vitamins, low glycemic index 6) broccoli- great source of vitamin A and C 7) quinoa- higher in protein and fiber than other grains 8) spinach- high in vitamin A, fiber, and protein 9) olive oil- reduces glucose levels, LDL, and triglycerides 10) salmon- excellent amount of omega-3-fatty acids 11) walnuts- rich in antioxidants 12) apples- high in fiber and quercetin 13) carrots- highly nutritious with low impact on blood sugar 14) eggs- improve HDL (good cholesterol), high in protein, keep you satiated 15) turmeric: improves blood sugars, cardiovascular disease, and protects kidney health 16) garlic: improves blood sugar, blood pressure, pain 17) tomatoes: highly nutritious with low impact on blood sugar   8) Migraines: -encouraged avoiding gluten  9) Hungry: -encouraged drinking tea between the meals  10) Flu: -discussed the medications she took the night she had a flu and how she woke up and her body was cold- frozen stiff and she had to work to get her fingers open -discussed that she got off her short acting  11) Low back pain : XR  ordered  -Discussed Qutenza as an option for neuropathic pain control. Discussed that this is a capsaicin patch, stronger than capsaicin cream. Discussed that it is currently approved for diabetic peripheral neuropathy and post-herpetic neuralgia, but that it has also shown benefit in treating other forms of neuropathy. Provided patient with link to site to learn more about the patch: https://www.clark.biz/. Discussed that the patch would be placed in office and benefits usually last 3 months. Discussed that unintended exposure to capsaicin can cause severe irritation of eyes, mucous membranes, respiratory tract, and skin, but that Qutenza is a local treatment and does not have the systemic side effects of other nerve medications. Discussed that there may be pain, itching, erythema, and decreased sensory function associated with the application of Qutenza. Side effects usually subside within 1 week. A cold pack of analgesic medications can help with these side effects. Blood pressure can also be increased due to pain associated with administration of the patch.   12) Thoracic spine pain: XR ordered  13) Cervical spine pain: -discussed that XR shows disc degeneration  14) Neuropathy -Discussed Qutenza as an option for neuropathic pain control. Discussed that this is a capsaicin patch, stronger than capsaicin cream. Discussed that it is currently approved for diabetic peripheral neuropathy and post-herpetic neuralgia, but that it has also shown benefit in treating other forms of neuropathy. Provided patient with link to site to learn more about the patch: https://www.clark.biz/. Discussed that the patch would be placed in office and benefits usually last 3 months. Discussed that unintended exposure to capsaicin can cause severe  irritation of eyes, mucous membranes, respiratory tract, and skin, but that Qutenza is a local treatment and does not have the systemic side effects of other nerve medications.  Discussed that there may be pain, itching, erythema, and decreased sensory function associated with the application of Qutenza. Side effects usually subside within 1 week. A cold pack of analgesic medications can help with these side effects. Blood pressure can also be increased due to pain associated with administration of the patch.   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 Panic attack: -replace avapro with propanolol daily -recommended Relaxium at night  17. Tremor: -replace avapro with propanolol  18. Insomnia:  -discussed melatonin  19. Hypotension:  -recommended staggering blood pressure medications and lasix -discussed that she is not taking metoprolol , took this in the past

## 2024-11-14 DIAGNOSIS — E890 Postprocedural hypothyroidism: Secondary | ICD-10-CM | POA: Diagnosis not present

## 2024-11-14 DIAGNOSIS — E1165 Type 2 diabetes mellitus with hyperglycemia: Secondary | ICD-10-CM | POA: Diagnosis not present

## 2024-11-14 DIAGNOSIS — I10 Essential (primary) hypertension: Secondary | ICD-10-CM | POA: Diagnosis not present

## 2024-11-14 DIAGNOSIS — E669 Obesity, unspecified: Secondary | ICD-10-CM | POA: Diagnosis not present

## 2024-11-16 NOTE — Progress Notes (Addendum)
 JACKLYNE BAIK                                          MRN: [id number]   12/14/2024   The [facility] Quality Team Specialist reviewed this patient medical record for the purposes of chart review for care gap closure. The following were reviewed: chart review for care gap closure-kidney health evaluation for diabetes:eGFR  and uACR.    [facility] Quality Team

## 2024-11-21 ENCOUNTER — Encounter: Admitting: Registered Nurse

## 2024-12-19 ENCOUNTER — Other Ambulatory Visit: Payer: Self-pay

## 2024-12-19 DIAGNOSIS — G894 Chronic pain syndrome: Secondary | ICD-10-CM

## 2024-12-19 MED ORDER — OXYCODONE HCL 10 MG PO TABS: 10.0000 mg | ORAL_TABLET | Freq: Four times a day (QID) | ORAL | 0 refills | Status: DC | PRN

## 2024-12-24 ENCOUNTER — Encounter: Admitting: Registered Nurse

## 2024-12-24 NOTE — Progress Notes (Signed)
 "  Subjective:    Patient ID: Rhonda Bradley, female    DOB: 1966/06/22, 59 y.o.   MRN: [id number]  HPI: Rhonda Bradley is a 59 y.o. female who returns for follow up appointment for chronic pain and medication refill. She states her pain is located in her neck, bilateral shoulders, upper- lower back. She also reports she has generalized fibro pain. She rates her pain 10. Her current exercise regime is walking and performing stretching exercises.  [name] Morphine equivalent is 60.00 MME.   [name] she  is also prescribed Alprazlam by Angeline Bart ANP .We have discussed the black box warning of using opioids and benzodiazepines. I highlighted the dangers of using these drugs together and discussed the adverse events including respiratory suppression, overdose, cognitive impairment and importance of compliance with current regimen. We will continue to monitor and adjust as indicated.  she is being closely monitored and under the care of her psychiatrist.    Oral Swab was Performed today.    Pain Inventory Average Pain 10 Pain Right Now 10 My pain is constant, sharp, burning, stabbing, and aching  In the last 24 hours, has pain interfered with the following? General activity 10 Relation with others 10 Enjoyment of life 10 What TIME of day is your pain at its worst? morning  and daytime Sleep (in general) Fair  Pain is worse with: walking, bending, sitting, inactivity, standing, and some activites Pain improves with: rest, heat, medication Relief from Meds: 6  Family History  Problem Relation Age of Onset   Breast cancer Maternal Aunt    Allergic rhinitis Mother    Asthma Mother    High blood pressure Mother    High Cholesterol Mother    Depression Mother    Anxiety disorder Mother    Alcoholism Mother    Eczema Father    High blood pressure Father    High Cholesterol Father    Heart disease Father    Depression Father    Liver disease Father    Alcoholism  Father    AAA (abdominal aortic aneurysm) Father    Angioedema Maternal Grandfather    Urticaria Maternal Grandfather    Social History   Socioeconomic History   Marital status: Married    Spouse name: Optometrist   Number of children: Not on file   Years of education: Not on file   Highest education level: Not on file  Occupational History   Occupation: Therapist, [employer]  Tobacco Use   Smoking status: Never   Smokeless tobacco: Never  Vaping Use   Vaping status: Never Used  Substance and Sexual Activity   Alcohol use: Yes    Comment: socially    Drug use: No   Sexual activity: Not on file  Other Topics Concern   Not on file  Social History Narrative   Not on file   Social Drivers of Health   Tobacco Use: Low Risk (10/19/2024)   Patient History    Smoking Tobacco Use: Never    Smokeless Tobacco Use: Never    Passive Exposure: Not on file  Financial Resource Strain: Not on file  Food Insecurity: Low Risk (08/31/2023)   Received from [facility]   Epic    Within the past 12 months, you worried that your food would run out before you got money to buy more: Never true    Within the past 12 months, the food you bought just didn't last and  you didn't have money to get more. : Never true  Transportation Needs: No Transportation Needs (08/31/2023)   Received [facility]   Transportation    In the past 12 months, has lack of reliable transportation kept you from medical appointments, meetings, work or from getting things needed for daily living? : No  Physical Activity: Not on file  Stress: Not on file  Social Connections: Not on file  Depression (PHQ2-9): Low Risk (10/19/2024)   Depression (PHQ2-9)    PHQ-2 Score: 0  Alcohol Screen: Not on file  Housing: Low Risk (08/31/2023)   Received from [facility]   Epic    What is your living situation today?: I have a steady place to live    Think about the place you live. Do you have problems  with any of the following? Choose all that apply:: None/None on this list  Utilities: Low Risk (08/31/2023)   Received from [facility]   Utilities    In the past 12 months has the electric, gas, oil, or water company threatened to shut off services in your home? : No  Health Literacy: Not on file   Past Surgical History:  Procedure Laterality Date   BREAST BIOPSY     BREAST EXCISIONAL BIOPSY     CESAREAN SECTION     x 3   CHOLECYSTECTOMY     THYROIDECTOMY N/A 09/19/2014   Procedure: THYROIDECTOMY;  Surgeon: Krystal Spinner, MD;  Location: [facility];  Service: General;  Laterality: N/A;   Past Surgical History:  Procedure Laterality Date   BREAST BIOPSY     BREAST EXCISIONAL BIOPSY     CESAREAN SECTION     x 3   CHOLECYSTECTOMY     THYROIDECTOMY N/A 09/19/2014   Procedure: THYROIDECTOMY;  Surgeon: Krystal Spinner, MD;  Location: [facility];  Service: General;  Laterality: N/A;   Past Medical History:  Diagnosis Date   Anemia    as a child    Anxiety    Asthma    Back pain    Chronic fatigue syndrome    Complication of anesthesia    slow to wake up age 74, but since cholecystectomy and no problems    Constipation    Depression    Diabetes (HCC)    Family history of anesthesia complication    father slow to wake up    Fatty liver    Fibromyalgia    GERD (gastroesophageal reflux disease)    Gestational diabetes    Headache    High blood pressure    High cholesterol    Hypertension    IBS (irritable bowel syndrome)    Joint pain    Lactose intolerance    Osteopenia    SOB (shortness of breath)    There were no vitals taken for this visit.  Opioid Risk Score:   Fall Risk Score:  `1  Depression screen PHQ 2/9     10/19/2024    2:01 PM 07/18/2024    2:21 PM 06/19/2024    2:32 PM 05/09/2024    1:08 PM 04/10/2024    2:25 PM 01/08/2022    2:21 PM 01/08/2022    2:20 PM  Depression screen PHQ 2/9  Decreased Interest 0 1 0 1 1 1  0  Down, Depressed, Hopeless 0 1 0 1 1 1  0  PHQ -  2 Score 0 2 0 2 2 2  0    Review of Systems  Musculoskeletal:  Positive for arthralgias and back  pain.       Pain all over the body  All other systems reviewed and are negative.      Objective:   Physical Exam Vitals and nursing note reviewed.  Constitutional:      Appearance: Normal appearance.  Cardiovascular:     Rate and Rhythm: Normal rate and regular rhythm.     Pulses: Normal pulses.     Heart sounds: Normal heart sounds.  Pulmonary:     Effort: Pulmonary effort is normal.     Breath sounds: Normal breath sounds.  Musculoskeletal:     Comments: Normal Muscle Bulk and Muscle Testing Reveals:  Upper Extremities: Full ROM and Muscle Strength 5/5   Thoracic and  Lumbar Hypersensitivity Bilateral Greater Trochanter Tenderness Lower Extremities: Full ROM abd Muscle Strength 5/5 Bilateral Lower Extremities Flexion Produces Pain into her Bilateral Lower Extremities Arises from Table with ease Narrow Based  Gait     Skin:    General: Skin is warm and dry.  Neurological:     Mental Status: She is alert and oriented to person, place, and time.  Psychiatric:        Mood and Affect: Mood normal.        Behavior: Behavior normal.          Assessment & Plan:  Cervicalgia/ Cervical Radiculitis: Continue HEP as tolerated. Continue to monitor. 12/28/2024 Chronic Bilateral Shoulder Pain: Continue HEP as tolerated. Continue to Monitor.  3.Chronic Bilateral Mid-Low-Back Pain without Sciatica: Continue HEP as Tolerated. Continue Current medication regimen. Continue to monitor. 12/28/2024 4.Fibromyalgia: Continue HEP as Tolerated. Continue to monitor. 12/28/2024 5. Chronic Pain Syndrome: Continue Oxycodone  10mg  one tablet every 6 hours as needed for pain # 120. Second script sent for the following month. We will continue the opioid monitoring program, this consists of regular clinic visits, examinations, urine drug screen, pill counts as well as use of [place]  Controlled  Substance Reporting system. A 12 month History has been reviewed on the [place]  Controlled Substance Reporting System on 12/28/2024     F/U in 2 months   "

## 2024-12-24 NOTE — Progress Notes (Unsigned)
 "  Subjective:    Patient ID: Rhonda Bradley, female    DOB: 24-May-1966, 59 y.o.   MRN: [id number]  HPI   Pain Inventory Average Pain {NUMBERS; 0-10:5044} Pain Right Now {NUMBERS; 0-10:5044} My pain is {PAIN DESCRIPTION:21022940}  In the last 24 hours, has pain interfered with the following? General activity {NUMBERS; 0-10:5044} Relation with others {NUMBERS; 0-10:5044} Enjoyment of life {NUMBERS; 0-10:5044} What TIME of day is your pain at its worst? {time of day:24191} Sleep (in general) {BHH GOOD/FAIR/POOR:22877}  Pain is worse with: {ACTIVITIES:21022942} Pain improves with: {PAIN IMPROVES TPUY:78977056} Relief from Meds: {NUMBERS; 0-10:5044}  Family History  Problem Relation Age of Onset   Breast cancer Maternal Aunt    Allergic rhinitis Mother    Asthma Mother    High blood pressure Mother    High Cholesterol Mother    Depression Mother    Anxiety disorder Mother    Alcoholism Mother    Eczema Father    High blood pressure Father    High Cholesterol Father    Heart disease Father    Depression Father    Liver disease Father    Alcoholism Father    AAA (abdominal aortic aneurysm) Father    Angioedema Maternal Grandfather    Urticaria Maternal Grandfather    Social History   Socioeconomic History   Marital status: Married    Spouse name: Optometrist   Number of children: Not on file   Years of education: Not on file   Highest education level: Not on file  Occupational History   Occupation: Therapist, [employer]  Tobacco Use   Smoking status: Never   Smokeless tobacco: Never  Vaping Use   Vaping status: Never Used  Substance and Sexual Activity   Alcohol use: Yes    Comment: socially    Drug use: No   Sexual activity: Not on file  Other Topics Concern   Not on file  Social History Narrative   Not on file   Social Drivers of Health   Tobacco Use: Low Risk (10/19/2024)   Patient History    Smoking Tobacco Use: Never     Smokeless Tobacco Use: Never    Passive Exposure: Not on file  Financial Resource Strain: Not on file  Food Insecurity: Low Risk (08/31/2023)   Received from [facility]   Epic    Within the past 12 months, you worried that your food would run out before you got money to buy more: Never true    Within the past 12 months, the food you bought just didn't last and you didn't have money to get more. : Never true  Transportation Needs: No Transportation Needs (08/31/2023)   Received from [facility]    In the past 12 months, has lack of reliable transportation kept you from medical appointments, meetings, work or from getting things needed for daily living? : No  Physical Activity: Not on file  Stress: Not on file  Social Connections: Not on file  Depression (PHQ2-9): Low Risk (10/19/2024)   Depression (PHQ2-9)    PHQ-2 Score: 0  Alcohol Screen: Not on file  Housing: Low Risk (08/31/2023)   Received from [facility]   Epic    What is your living situation today?: I have a steady place to live    Think about the place you live. Do you have problems with any of the following? Choose all that apply:: None/None on this list  Utilities: Low  Risk (08/31/2023)   Received from [facility]   Utilities    In the past 12 months has the electric, gas, oil, or water company threatened to shut off services in your home? : No  Health Literacy: Not on file   Past Surgical History:  Procedure Laterality Date   BREAST BIOPSY     BREAST EXCISIONAL BIOPSY     CESAREAN SECTION     x 3   CHOLECYSTECTOMY     THYROIDECTOMY N/A 09/19/2014   Procedure: THYROIDECTOMY;  Surgeon: Krystal Spinner, MD;  Location: [facility];  Service: General;  Laterality: N/A;   Past Surgical History:  Procedure Laterality Date   BREAST BIOPSY     BREAST EXCISIONAL BIOPSY     CESAREAN SECTION     x 3   CHOLECYSTECTOMY     THYROIDECTOMY N/A 09/19/2014   Procedure: THYROIDECTOMY;  Surgeon: Krystal Spinner, MD;   Location: [facility];  Service: General;  Laterality: N/A;   Past Medical History:  Diagnosis Date   Anemia    as a child    Anxiety    Asthma    Back pain    Chronic fatigue syndrome    Complication of anesthesia    slow to wake up age 89, but since cholecystectomy and no problems    Constipation    Depression    Diabetes (HCC)    Family history of anesthesia complication    father slow to wake up    Fatty liver    Fibromyalgia    GERD (gastroesophageal reflux disease)    Gestational diabetes    Headache    High blood pressure    High cholesterol    Hypertension    IBS (irritable bowel syndrome)    Joint pain    Lactose intolerance    Osteopenia    SOB (shortness of breath)    There were no vitals taken for this visit.  Opioid Risk Score:   Fall Risk Score:  `1  Depression screen PHQ 2/9     10/19/2024    2:01 PM 07/18/2024    2:21 PM 06/19/2024    2:32 PM 05/09/2024    1:08 PM 04/10/2024    2:25 PM 01/08/2022    2:21 PM 01/08/2022    2:20 PM  Depression screen PHQ 2/9  Decreased Interest 0 1 0 1 1 1  0  Down, Depressed, Hopeless 0 1 0 1 1 1  0  PHQ - 2 Score 0 2 0 2 2 2  0    Review of Systems     Objective:   Physical Exam        Assessment & Plan:    "

## 2024-12-26 ENCOUNTER — Other Ambulatory Visit: Payer: Self-pay | Admitting: Adult Health

## 2024-12-26 DIAGNOSIS — G4700 Insomnia, unspecified: Secondary | ICD-10-CM

## 2024-12-28 ENCOUNTER — Encounter: Payer: Self-pay | Admitting: Registered Nurse

## 2024-12-28 ENCOUNTER — Encounter: Attending: Physical Medicine and Rehabilitation | Admitting: Registered Nurse

## 2024-12-28 VITALS — BP 120/77 | HR 68 | Ht 67.0 in | Wt 195.0 lb

## 2024-12-28 DIAGNOSIS — G894 Chronic pain syndrome: Secondary | ICD-10-CM | POA: Diagnosis not present

## 2024-12-28 DIAGNOSIS — M797 Fibromyalgia: Secondary | ICD-10-CM | POA: Diagnosis not present

## 2024-12-28 DIAGNOSIS — M545 Low back pain, unspecified: Secondary | ICD-10-CM | POA: Insufficient documentation

## 2024-12-28 DIAGNOSIS — M25512 Pain in left shoulder: Secondary | ICD-10-CM | POA: Insufficient documentation

## 2024-12-28 DIAGNOSIS — M546 Pain in thoracic spine: Secondary | ICD-10-CM | POA: Insufficient documentation

## 2024-12-28 DIAGNOSIS — M542 Cervicalgia: Secondary | ICD-10-CM | POA: Diagnosis not present

## 2024-12-28 DIAGNOSIS — G8929 Other chronic pain: Secondary | ICD-10-CM | POA: Diagnosis present

## 2024-12-28 DIAGNOSIS — Z79899 Other long term (current) drug therapy: Secondary | ICD-10-CM | POA: Diagnosis not present

## 2024-12-28 DIAGNOSIS — M25511 Pain in right shoulder: Secondary | ICD-10-CM | POA: Insufficient documentation

## 2024-12-28 DIAGNOSIS — Z5181 Encounter for therapeutic drug level monitoring: Secondary | ICD-10-CM | POA: Diagnosis not present

## 2024-12-28 MED ORDER — OXYCODONE HCL 10 MG PO TABS: 10.0000 mg | ORAL_TABLET | Freq: Four times a day (QID) | ORAL | 0 refills | Status: AC | PRN

## 2024-12-28 MED ORDER — BUPRENORPHINE 7.5 MCG/HR TD PTWK: 1.0000 | MEDICATED_PATCH | TRANSDERMAL | 0 refills | Status: AC

## 2024-12-28 NOTE — Patient Instructions (Addendum)
 Call office or send My- Chart message in 2- 3 weeks , update on [place] 

## 2024-12-31 ENCOUNTER — Encounter: Payer: Self-pay | Admitting: Registered Nurse

## 2025-01-03 LAB — DRUG TOX MONITOR 1 W/CONF, ORAL FLD
Amphetamines: NEGATIVE ng/mL
Barbiturates: NEGATIVE ng/mL
Benzodiazepines: NEGATIVE ng/mL
Buprenorphine: NEGATIVE ng/mL
Cocaine: NEGATIVE ng/mL
Codeine: NEGATIVE ng/mL
Dihydrocodeine: NEGATIVE ng/mL
Fentanyl: NEGATIVE ng/mL
Heroin Metabolite: NEGATIVE ng/mL
Hydrocodone: NEGATIVE ng/mL
Hydromorphone: NEGATIVE ng/mL
MARIJUANA: NEGATIVE ng/mL
MDMA: NEGATIVE ng/mL
Meprobamate: NEGATIVE ng/mL
Methadone: NEGATIVE ng/mL
Morphine: NEGATIVE ng/mL
Nicotine Metabolite: NEGATIVE ng/mL
Norhydrocodone: NEGATIVE ng/mL
Noroxycodone: 14.5 ng/mL — ABNORMAL HIGH
Opiates: POSITIVE ng/mL — AB
Oxycodone: 70.9 ng/mL — ABNORMAL HIGH
Oxymorphone: NEGATIVE ng/mL
Phencyclidine: NEGATIVE ng/mL
Tapentadol: NEGATIVE ng/mL
Tramadol: NEGATIVE ng/mL
Zolpidem: NEGATIVE ng/mL

## 2025-01-03 LAB — DRUG TOX ALC METAB W/CON, ORAL FLD: Alcohol Metabolite: NEGATIVE ng/mL

## 2025-01-16 ENCOUNTER — Ambulatory Visit: Payer: Self-pay | Admitting: Neurology

## 2025-02-12 ENCOUNTER — Encounter: Admitting: Registered Nurse

## 2025-02-15 ENCOUNTER — Encounter: Admitting: Registered Nurse

## 2025-03-25 ENCOUNTER — Telehealth: Admitting: Adult Health
